# Patient Record
Sex: Female | Born: 1970 | Race: White | Hispanic: No | Marital: Married | Smoking: Former smoker
Health system: Southern US, Community
[De-identification: ages and names within clinical notes are randomized; demographics above are authoritative.]

## PROBLEM LIST (undated history)

## (undated) DIAGNOSIS — F17200 Nicotine dependence, unspecified, uncomplicated: Secondary | ICD-10-CM

## (undated) DIAGNOSIS — I471 Supraventricular tachycardia, unspecified: Secondary | ICD-10-CM

## (undated) DIAGNOSIS — G5600 Carpal tunnel syndrome, unspecified upper limb: Secondary | ICD-10-CM

## (undated) DIAGNOSIS — F1190 Opioid use, unspecified, uncomplicated: Secondary | ICD-10-CM

## (undated) DIAGNOSIS — F419 Anxiety disorder, unspecified: Secondary | ICD-10-CM

## (undated) DIAGNOSIS — K56609 Unspecified intestinal obstruction, unspecified as to partial versus complete obstruction: Secondary | ICD-10-CM

## (undated) DIAGNOSIS — G08 Intracranial and intraspinal phlebitis and thrombophlebitis: Secondary | ICD-10-CM

## (undated) DIAGNOSIS — Z79899 Other long term (current) drug therapy: Secondary | ICD-10-CM

## (undated) DIAGNOSIS — K625 Hemorrhage of anus and rectum: Secondary | ICD-10-CM

## (undated) DIAGNOSIS — M1990 Unspecified osteoarthritis, unspecified site: Secondary | ICD-10-CM

## (undated) DIAGNOSIS — E119 Type 2 diabetes mellitus without complications: Secondary | ICD-10-CM

## (undated) DIAGNOSIS — I10 Essential (primary) hypertension: Secondary | ICD-10-CM

## (undated) DIAGNOSIS — D649 Anemia, unspecified: Secondary | ICD-10-CM

## (undated) DIAGNOSIS — M797 Fibromyalgia: Secondary | ICD-10-CM

## (undated) DIAGNOSIS — F32A Depression, unspecified: Secondary | ICD-10-CM

## (undated) DIAGNOSIS — K219 Gastro-esophageal reflux disease without esophagitis: Secondary | ICD-10-CM

## (undated) DIAGNOSIS — I5189 Other ill-defined heart diseases: Secondary | ICD-10-CM

## (undated) DIAGNOSIS — Z9889 Other specified postprocedural states: Secondary | ICD-10-CM

## (undated) DIAGNOSIS — G4733 Obstructive sleep apnea (adult) (pediatric): Secondary | ICD-10-CM

## (undated) DIAGNOSIS — D352 Benign neoplasm of pituitary gland: Secondary | ICD-10-CM

## (undated) DIAGNOSIS — Z87898 Personal history of other specified conditions: Secondary | ICD-10-CM

## (undated) DIAGNOSIS — D497 Neoplasm of unspecified behavior of endocrine glands and other parts of nervous system: Secondary | ICD-10-CM

## (undated) DIAGNOSIS — H93A9 Pulsatile tinnitus, unspecified ear: Secondary | ICD-10-CM

## (undated) DIAGNOSIS — D509 Iron deficiency anemia, unspecified: Secondary | ICD-10-CM

## (undated) HISTORY — DX: Intracranial and intraspinal phlebitis and thrombophlebitis: G08

## (undated) HISTORY — DX: Carpal tunnel syndrome, unspecified upper limb: G56.00

## (undated) HISTORY — DX: Personal history of other specified conditions: Z87.898

## (undated) HISTORY — DX: Depression, unspecified: F32.A

## (undated) HISTORY — DX: Nicotine dependence, unspecified, uncomplicated: F17.200

## (undated) HISTORY — PX: FRACTURE SURGERY: SHX138

## (undated) HISTORY — DX: Opioid use, unspecified, uncomplicated: F11.90

## (undated) HISTORY — DX: Anemia, unspecified: D64.9

## (undated) HISTORY — PX: CHOLECYSTECTOMY: SHX55

## (undated) HISTORY — PX: CARDIAC ELECTROPHYSIOLOGY STUDY AND ABLATION: SHX1294

## (undated) HISTORY — DX: Obstructive sleep apnea (adult) (pediatric): G47.33

## (undated) HISTORY — PX: BACK SURGERY: SHX140

---

## 2014-05-26 DIAGNOSIS — D352 Benign neoplasm of pituitary gland: Secondary | ICD-10-CM

## 2014-05-26 HISTORY — DX: Benign neoplasm of pituitary gland: D35.2

## 2019-03-07 ENCOUNTER — Encounter: Payer: Self-pay | Admitting: Emergency Medicine

## 2019-03-07 ENCOUNTER — Other Ambulatory Visit: Payer: Self-pay

## 2019-03-07 ENCOUNTER — Ambulatory Visit
Admission: EM | Admit: 2019-03-07 | Discharge: 2019-03-07 | Disposition: A | Discharge status: Home / Self Care | Payer: BC Managed Care – PPO | Attending: Family Medicine | Admitting: Family Medicine

## 2019-03-07 DIAGNOSIS — R319 Hematuria, unspecified: Secondary | ICD-10-CM

## 2019-03-07 DIAGNOSIS — N390 Urinary tract infection, site not specified: Secondary | ICD-10-CM | POA: Diagnosis not present

## 2019-03-07 HISTORY — DX: Unspecified osteoarthritis, unspecified site: M19.90

## 2019-03-07 HISTORY — DX: Type 2 diabetes mellitus without complications: E11.9

## 2019-03-07 LAB — URINALYSIS, COMPLETE (UACMP) WITH MICROSCOPIC
Bilirubin Urine: NEGATIVE
Glucose, UA: NEGATIVE mg/dL
Ketones, ur: NEGATIVE mg/dL
Nitrite: NEGATIVE
Protein, ur: NEGATIVE mg/dL
Specific Gravity, Urine: 1.01 (ref 1.005–1.030)
pH: 7 (ref 5.0–8.0)

## 2019-03-07 MED ORDER — CEPHALEXIN 500 MG PO CAPS: 500.0000 mg | ORAL_CAPSULE | Freq: Two times a day (BID) | ORAL | 0 refills | Status: AC

## 2019-03-07 NOTE — ED Triage Notes (Signed)
Patient in office today for UTI? States this started around 5 a.m with Freq,Urgency and Burning   SYV:GCYO

## 2019-03-07 NOTE — ED Provider Notes (Signed)
[facility] ____________________________________________  Time seen: Approximately 2:05 PM  I have reviewed the triage vital signs and the nursing notes.   HISTORY  Chief Complaint Urinary Tract Infection   HPI Breanna Ramos is a 48 y.o. female presenting for evaluation of urinary frequency, urinary urgency and burning with urination since around 5 AM this morning.  Patient states feels consistent with previous UTIs prompting her to come in.  Denies any atypical back pain.  Some lower abdominal midline pressure.  Denies atypical vaginal discharge.  Denies recent fevers, vomiting, cough, or recent sickness.  Denies aggravating factors.  Over-the-counter Azo has helped minimally.  Reports otherwise doing well.    Past Medical History:  Diagnosis Date  . Arthritis   . Diabetes mellitus without complication ([place])     There are no active problems to display for this patient.   surgical history Back surgery   No current facility-administered medications for this encounter.   Current Outpatient Medications:  .  celecoxib (CELEBREX) 200 MG capsule, Take by mouth., Disp: , Rfl:  .  cyclobenzaprine (FLEXERIL) 10 MG tablet, Take by mouth., Disp: , Rfl:  .  diltiazem (CARDIZEM CD) 240 MG 24 hr capsule, Take by mouth., Disp: , Rfl:  .  famciclovir (FAMVIR) 500 MG tablet, Take by mouth., Disp: , Rfl:  .  FLUoxetine (PROZAC) 40 MG capsule, Take 40 mg by mouth daily., Disp: , Rfl:  .  metFORMIN (GLUCOPHAGE-XR) 500 MG 24 hr tablet, Take by mouth., Disp: , Rfl:  .  omeprazole (PRILOSEC) 40 MG capsule, Take by mouth., Disp: , Rfl:  .  ondansetron (ZOFRAN) 4 MG tablet, Take 4 mg by mouth every 8 (eight) hours as needed for nausea or vomiting., Disp: , Rfl:  .  oxyCODONE-acetaminophen (PERCOCET/ROXICET) 5-325 MG tablet, Take by mouth., Disp: , Rfl:  .  topiramate (TOPAMAX) 50 MG tablet, Take by mouth., Disp: , Rfl:  .  cephALEXin (KEFLEX) 500 MG capsule, Take 1 capsule (500 mg  total) by mouth 2 (two) times daily for 7 days., Disp: 14 capsule, Rfl: 0 .  LORazepam (ATIVAN) 1 MG tablet, Take by mouth., Disp: , Rfl:   Allergies Codeine  History reviewed. No pertinent family history.  Social History Social History   Tobacco Use  . Smoking status: Current Some Day Smoker  . Smokeless tobacco: Never Used  Substance Use Topics  . Alcohol use: Yes  . Drug use: Never    Review of Systems Constitutional: No fever Cardiovascular: Denies chest pain. Respiratory: Denies shortness of breath. Gastrointestinal: No abdominal pain.  No nausea, no vomiting.   Genitourinary: Positive for dysuria. Musculoskeletal: Negative for atypical back pain. Skin: Negative for rash.   ____________________________________________   PHYSICAL EXAM:  VITAL SIGNS: ED Triage Vitals  Enc Vitals Group     BP 03/07/19 1316 (!) 125/93     Pulse Rate 03/07/19 1316 72     Resp 03/07/19 1316 18     Temp 03/07/19 1316 98.8 F (37.1 C)     Temp Source 03/07/19 1316 Oral     SpO2 03/07/19 1316 99 %     Weight 03/07/19 1306 204 lb (92.5 kg)     Height --      Head Circumference --      Peak Flow --      Pain Score 03/07/19 1306 4     Pain Loc --      Pain Edu? --      Excl. in [place]? --  Constitutional: Alert and oriented. Well appearing and in no acute distress. Eyes: Conjunctivae are normal.  ENT      Head: Normocephalic and atraumatic. Cardiovascular: Normal rate, regular rhythm. Grossly normal heart sounds.  Good peripheral circulation. Respiratory: Normal respiratory effort without tachypnea nor retractions. Breath sounds are clear and equal bilaterally. No wheezes, rales, rhonchi. Gastrointestinal: Soft and nontender. No CVA tenderness. Musculoskeletal: Steady gait.  Neurologic:  Normal speech and language.Speech is normal. No gait instability.  Skin:  Skin is warm, dry and intact. No rash noted. Psychiatric: Mood and affect are normal. Speech and behavior are normal.  Patient exhibits appropriate insight and judgment   ___________________________________________   LABS (all labs ordered are listed, but only abnormal results are displayed)  Labs Reviewed  URINALYSIS, COMPLETE (UACMP) WITH MICROSCOPIC - Abnormal; Notable for the following components:      Result Value   Hgb urine dipstick TRACE (*)    Leukocytes,Ua SMALL (*)    Bacteria, UA FEW (*)    All other components within normal limits  URINE CULTURE     PROCEDURES Procedures    INITIAL IMPRESSION / ASSESSMENT AND PLAN / ED COURSE  Pertinent labs & imaging results that were available during my care of the patient were reviewed by me and considered in my medical decision making (see chart for details).  Well-appearing patient.  No acute distress.  Urinalysis reviewed, suspect UTI.  Will empirically start oral Keflex.  Encourage rest, fluids, supportive care.Discussed indication, risks and benefits of medications with patient.  Discussed follow up and return parameters including no resolution or any worsening concerns. Patient verbalized understanding and agreed to plan.   ____________________________________________   FINAL CLINICAL IMPRESSION(S) / ED DIAGNOSES  Final diagnoses:  Urinary tract infection with hematuria, site unspecified     ED Discharge Orders         Ordered    cephALEXin (KEFLEX) 500 MG capsule  2 times daily     03/07/19 1402           Note: This dictation was prepared with Dragon dictation along with smaller phrase technology. Any transcriptional errors that result from this process are unintentional.         Marylene Land, NP 03/07/19 1442

## 2019-03-07 NOTE — Discharge Instructions (Addendum)
Take medication as prescribed. Drink plenty of fluids.   Follow up with your primary care physician this week as needed. Return to Urgent care for new or worsening concerns.

## 2019-03-09 LAB — URINE CULTURE: Culture: <10000 — AB

## 2019-12-12 ENCOUNTER — Encounter: Payer: Self-pay | Admitting: Emergency Medicine

## 2019-12-12 ENCOUNTER — Other Ambulatory Visit: Payer: Self-pay

## 2019-12-12 ENCOUNTER — Ambulatory Visit
Admission: EM | Admit: 2019-12-12 | Discharge: 2019-12-12 | Disposition: A | Discharge status: Home / Self Care | Payer: BLUE CROSS/BLUE SHIELD | Attending: Family Medicine | Admitting: Family Medicine

## 2019-12-12 DIAGNOSIS — J069 Acute upper respiratory infection, unspecified: Secondary | ICD-10-CM | POA: Diagnosis not present

## 2019-12-12 DIAGNOSIS — Z79899 Other long term (current) drug therapy: Secondary | ICD-10-CM | POA: Diagnosis not present

## 2019-12-12 DIAGNOSIS — M1990 Unspecified osteoarthritis, unspecified site: Secondary | ICD-10-CM | POA: Insufficient documentation

## 2019-12-12 DIAGNOSIS — Z20822 Contact with and (suspected) exposure to covid-19: Secondary | ICD-10-CM | POA: Insufficient documentation

## 2019-12-12 DIAGNOSIS — E119 Type 2 diabetes mellitus without complications: Secondary | ICD-10-CM | POA: Insufficient documentation

## 2019-12-12 DIAGNOSIS — F17200 Nicotine dependence, unspecified, uncomplicated: Secondary | ICD-10-CM | POA: Insufficient documentation

## 2019-12-12 DIAGNOSIS — R05 Cough: Secondary | ICD-10-CM | POA: Diagnosis present

## 2019-12-12 DIAGNOSIS — Z7984 Long term (current) use of oral hypoglycemic drugs: Secondary | ICD-10-CM | POA: Diagnosis not present

## 2019-12-12 LAB — SARS CORONAVIRUS 2 (TAT 6-24 HRS): SARS Coronavirus 2: NEGATIVE

## 2019-12-12 MED ORDER — IPRATROPIUM BROMIDE 0.06 % NA SOLN: 2.0000 | Freq: Four times a day (QID) | NASAL | 0 refills | Status: DC | PRN

## 2019-12-12 MED ORDER — BENZONATATE 200 MG PO CAPS: 200.0000 mg | ORAL_CAPSULE | Freq: Three times a day (TID) | ORAL | 0 refills | Status: DC | PRN

## 2019-12-12 MED ORDER — CETIRIZINE-PSEUDOEPHEDRINE ER 5-120 MG PO TB12: 1.0000 | ORAL_TABLET | Freq: Two times a day (BID) | ORAL | 0 refills | Status: DC

## 2019-12-12 NOTE — ED Provider Notes (Signed)
[facility]    CSN: [id number] Arrival date & time: 12/12/19  0940      History   Chief Complaint Chief Complaint  Patient presents with   Cough   Nasal Congestion   Otalgia    HPI  49 year old female presents with respiratory symptoms.  Started 2 days ago.  Patient reports cough, congestion, rhinorrhea, body aches.  Also reports elevated temperature, T-max 99.5.  Patient states that she has a "low-grade fever".  No reported sick contacts.  She has been vaccinated.  She has taken some over-the-counter medication without relief.  No known exacerbating factors.  No other associated symptoms.  No other complaints.  Past Medical History:  Diagnosis Date   Arthritis    Diabetes mellitus without complication ([place])     Home Medications    Prior to Admission medications   Medication Sig Start Date End Date Taking? Authorizing Provider  celecoxib (CELEBREX) 200 MG capsule Take by mouth.   Yes [provider]  cyclobenzaprine (FLEXERIL) 10 MG tablet Take by mouth. 03/01/16  Yes [provider]  diltiazem (CARDIZEM CD) 240 MG 24 hr capsule Take by mouth. 03/01/16  Yes [provider]  famciclovir (FAMVIR) 500 MG tablet Take by mouth.   Yes [provider]  FLUoxetine (PROZAC) 40 MG capsule Take 40 mg by mouth daily.   Yes [provider]  LORazepam (ATIVAN) 1 MG tablet Take by mouth.   Yes [provider]  metFORMIN (GLUCOPHAGE-XR) 500 MG 24 hr tablet Take by mouth. 02/23/16  Yes [provider]  omeprazole (PRILOSEC) 40 MG capsule Take by mouth. 02/21/16  Yes [provider]  ondansetron (ZOFRAN) 4 MG tablet Take 4 mg by mouth every 8 (eight) hours as needed for nausea or vomiting.   Yes [provider]  oxyCODONE-acetaminophen (PERCOCET/ROXICET) 5-325 MG tablet Take by mouth. 03/02/16  Yes [provider]  topiramate (TOPAMAX) 50 MG tablet Take by mouth.   Yes [provider]  benzonatate (TESSALON) 200 MG capsule Take 1 capsule (200 mg total) by mouth 3 (three) times daily as needed for cough. 12/12/19   Coral Spikes, DO  cetirizine-pseudoephedrine (ZYRTEC-D) 5-120 MG tablet Take 1 tablet by mouth 2 (two) times daily. 12/12/19   Thersa Salt G, DO  ipratropium (ATROVENT) 0.06 % nasal spray Place 2 sprays into both nostrils 4 (four) times daily as needed for rhinitis. 12/12/19   Coral Spikes, DO    Family History History reviewed. No pertinent family history.  Social History Social History   Tobacco Use   Smoking status: Current Some Day Smoker   Smokeless tobacco: Never Used  Substance Use Topics   Alcohol use: Yes   Drug use: Never     Allergies   Codeine   Review of Systems Review of Systems Per HPI  Physical Exam Triage Vital Signs ED Triage Vitals  Enc Vitals Group     BP 12/12/19 1001 135/88     Pulse Rate 12/12/19 1001 (!) 102     Resp 12/12/19 1001 18     Temp 12/12/19 1001 98.8 F (37.1 C)     Temp Source 12/12/19 1001 Oral     SpO2 12/12/19 1001 99 %     Weight 12/12/19 0955 188 lb (85.3 kg)     Height 12/12/19 0955 5' 6"  (1.676 m)     Head Circumference --      Peak Flow --      Pain Score 12/12/19  0955 0     Pain Loc --      Pain Edu? --      Excl. in [place]? --    Updated Vital Signs BP 135/88 (BP Location: Right Arm)    Pulse (!) 102    Temp 98.8 F (37.1 C) (Oral)    Resp 18    Ht 5' 6"  (1.676 m)    Wt 85.3 kg    LMP 12/09/2019    SpO2 99%    BMI 30.34 kg/m   Visual Acuity Right Eye Distance:   Left Eye Distance:   Bilateral Distance:    Right Eye Near:   Left Eye Near:    Bilateral Near:     Physical Exam Vitals and nursing note reviewed.  Constitutional:      General: She is not in acute distress.    Appearance: Normal appearance. She is not ill-appearing.  HENT:     Head: Normocephalic and atraumatic.     Right Ear: Tympanic membrane normal.     Left Ear: Tympanic membrane normal.     Mouth/Throat:      Pharynx: Oropharynx is clear. No oropharyngeal exudate.  Eyes:     General:        Right eye: No discharge.        Left eye: No discharge.     Conjunctiva/sclera: Conjunctivae normal.  Cardiovascular:     Rate and Rhythm: Normal rate and regular rhythm.     Heart sounds: No murmur heard.   Pulmonary:     Effort: Pulmonary effort is normal.     Breath sounds: Normal breath sounds. No wheezing, rhonchi or rales.  Neurological:     Mental Status: She is alert.  Psychiatric:        Mood and Affect: Mood normal.        Behavior: Behavior normal.    UC Treatments / Results  Labs (all labs ordered are listed, but only abnormal results are displayed) Labs Reviewed  SARS CORONAVIRUS 2 (TAT 6-24 HRS)    EKG   Radiology No results found.  Procedures Procedures (including critical care time)  Medications Ordered in UC Medications - No data to display  Initial Impression / Assessment and Plan / UC Course  I have reviewed the triage vital signs and the nursing notes.  Pertinent labs & imaging results that were available during my care of the patient were reviewed by me and considered in my medical decision making (see chart for details).    49 year old female presents with a viral URI with cough.  Awaiting Covid test result.  Treating with Atrovent nasal spray, Zyrtec-D, Tessalon Perles.  Final Clinical Impressions(s) / UC Diagnoses   Final diagnoses:  Viral URI with cough     Discharge Instructions     Rest.  Fluids.  Medications as directed.  Awaiting COVID test result (should be back tomorrow; check your mychart).  Take care  Dr. Lacinda Axon    ED Prescriptions    Medication Sig Dispense Auth. Provider   ipratropium (ATROVENT) 0.06 % nasal spray Place 2 sprays into both nostrils 4 (four) times daily as needed for rhinitis. 15 mL Adyn Hoes G, DO   cetirizine-pseudoephedrine (ZYRTEC-D) 5-120 MG tablet Take 1 tablet by mouth 2 (two) times daily. 30 tablet  Dilpreet Faires G, DO   benzonatate (TESSALON) 200 MG capsule Take 1 capsule (200 mg total) by mouth 3 (three) times daily as needed for cough. 30 capsule [place], [place], [place]  PDMP not reviewed this encounter.   Coral Spikes, DO 12/12/19 1100

## 2019-12-12 NOTE — ED Triage Notes (Signed)
Patient c/o cough, congestion and ear pain that started 2 days ago.

## 2019-12-12 NOTE — Discharge Instructions (Signed)
Rest.  Fluids.  Medications as directed.  Awaiting COVID test result (should be back tomorrow; check your mychart).  Take care  Dr. Lacinda Axon

## 2020-02-07 ENCOUNTER — Other Ambulatory Visit: Payer: Self-pay | Admitting: Internal Medicine

## 2020-02-07 DIAGNOSIS — Z1231 Encounter for screening mammogram for malignant neoplasm of breast: Secondary | ICD-10-CM

## 2020-02-13 ENCOUNTER — Inpatient Hospital Stay
Admission: EM | Admit: 2020-02-13 | Discharge: 2020-02-15 | DRG: 389 | Disposition: A | Discharge status: Home / Self Care | Payer: BLUE CROSS/BLUE SHIELD | Attending: Internal Medicine | Admitting: Internal Medicine

## 2020-02-13 ENCOUNTER — Other Ambulatory Visit: Payer: Self-pay

## 2020-02-13 ENCOUNTER — Emergency Department: Payer: BLUE CROSS/BLUE SHIELD

## 2020-02-13 DIAGNOSIS — I471 Supraventricular tachycardia: Secondary | ICD-10-CM | POA: Diagnosis present

## 2020-02-13 DIAGNOSIS — Z7984 Long term (current) use of oral hypoglycemic drugs: Secondary | ICD-10-CM

## 2020-02-13 DIAGNOSIS — K56609 Unspecified intestinal obstruction, unspecified as to partial versus complete obstruction: Secondary | ICD-10-CM

## 2020-02-13 DIAGNOSIS — M1990 Unspecified osteoarthritis, unspecified site: Secondary | ICD-10-CM | POA: Diagnosis present

## 2020-02-13 DIAGNOSIS — K56600 Partial intestinal obstruction, unspecified as to cause: Principal | ICD-10-CM | POA: Diagnosis present

## 2020-02-13 DIAGNOSIS — Z8249 Family history of ischemic heart disease and other diseases of the circulatory system: Secondary | ICD-10-CM

## 2020-02-13 DIAGNOSIS — N200 Calculus of kidney: Secondary | ICD-10-CM | POA: Diagnosis present

## 2020-02-13 DIAGNOSIS — I10 Essential (primary) hypertension: Secondary | ICD-10-CM | POA: Diagnosis present

## 2020-02-13 DIAGNOSIS — Z803 Family history of malignant neoplasm of breast: Secondary | ICD-10-CM

## 2020-02-13 DIAGNOSIS — M797 Fibromyalgia: Secondary | ICD-10-CM | POA: Diagnosis present

## 2020-02-13 DIAGNOSIS — Z981 Arthrodesis status: Secondary | ICD-10-CM

## 2020-02-13 DIAGNOSIS — Z79899 Other long term (current) drug therapy: Secondary | ICD-10-CM

## 2020-02-13 DIAGNOSIS — Z79890 Hormone replacement therapy (postmenopausal): Secondary | ICD-10-CM

## 2020-02-13 DIAGNOSIS — Z6830 Body mass index (BMI) 30.0-30.9, adult: Secondary | ICD-10-CM

## 2020-02-13 DIAGNOSIS — F17210 Nicotine dependence, cigarettes, uncomplicated: Secondary | ICD-10-CM | POA: Diagnosis present

## 2020-02-13 DIAGNOSIS — Z20822 Contact with and (suspected) exposure to covid-19: Secondary | ICD-10-CM | POA: Diagnosis present

## 2020-02-13 DIAGNOSIS — F419 Anxiety disorder, unspecified: Secondary | ICD-10-CM | POA: Diagnosis present

## 2020-02-13 DIAGNOSIS — F329 Major depressive disorder, single episode, unspecified: Secondary | ICD-10-CM | POA: Diagnosis present

## 2020-02-13 DIAGNOSIS — Z79891 Long term (current) use of opiate analgesic: Secondary | ICD-10-CM

## 2020-02-13 DIAGNOSIS — G8929 Other chronic pain: Secondary | ICD-10-CM | POA: Diagnosis present

## 2020-02-13 DIAGNOSIS — E669 Obesity, unspecified: Secondary | ICD-10-CM | POA: Diagnosis present

## 2020-02-13 DIAGNOSIS — Z9049 Acquired absence of other specified parts of digestive tract: Secondary | ICD-10-CM

## 2020-02-13 DIAGNOSIS — E119 Type 2 diabetes mellitus without complications: Secondary | ICD-10-CM | POA: Diagnosis present

## 2020-02-13 DIAGNOSIS — D352 Benign neoplasm of pituitary gland: Secondary | ICD-10-CM | POA: Diagnosis present

## 2020-02-13 DIAGNOSIS — M549 Dorsalgia, unspecified: Secondary | ICD-10-CM | POA: Diagnosis present

## 2020-02-13 DIAGNOSIS — Z823 Family history of stroke: Secondary | ICD-10-CM

## 2020-02-13 DIAGNOSIS — Z833 Family history of diabetes mellitus: Secondary | ICD-10-CM

## 2020-02-13 DIAGNOSIS — Z885 Allergy status to narcotic agent status: Secondary | ICD-10-CM

## 2020-02-13 HISTORY — DX: Fibromyalgia: M79.7

## 2020-02-13 HISTORY — DX: Other specified postprocedural states: Z98.890

## 2020-02-13 HISTORY — DX: Essential (primary) hypertension: I10

## 2020-02-13 HISTORY — DX: Benign neoplasm of pituitary gland: D35.2

## 2020-02-13 LAB — TROPONIN I (HIGH SENSITIVITY)
Troponin I (High Sensitivity): 3 ng/L (ref <18)
Troponin I (High Sensitivity): 3 ng/L (ref <18)

## 2020-02-13 LAB — COMPREHENSIVE METABOLIC PANEL
ALT: 25 U/L (ref 0–44)
AST: 20 U/L (ref 15–41)
Albumin: 4 g/dL (ref 3.5–5.0)
Alkaline Phosphatase: 80 U/L (ref 38–126)
Anion gap: 9 (ref 5–15)
BUN: 15 mg/dL (ref 6–20)
CO2: 28 mmol/L (ref 22–32)
Calcium: 8.9 mg/dL (ref 8.9–10.3)
Chloride: 101 mmol/L (ref 98–111)
Creatinine, Ser: 0.59 mg/dL (ref 0.44–1.00)
GFR calc Af Amer: >60 mL/min (ref >60)
GFR calc non Af Amer: >60 mL/min (ref >60)
Glucose, Bld: 95 mg/dL (ref 70–99)
Potassium: 4.1 mmol/L (ref 3.5–5.1)
Sodium: 138 mmol/L (ref 135–145)
Total Bilirubin: 0.6 mg/dL (ref 0.3–1.2)
Total Protein: 7 g/dL (ref 6.5–8.1)

## 2020-02-13 LAB — CBC
HCT: 35.4 % — ABNORMAL LOW (ref 36.0–46.0)
Hemoglobin: 11.8 g/dL — ABNORMAL LOW (ref 12.0–15.0)
MCH: 30.3 pg (ref 26.0–34.0)
MCHC: 33.3 g/dL (ref 30.0–36.0)
MCV: 91 fL (ref 80.0–100.0)
Platelets: 360 10*3/uL (ref 150–400)
RBC: 3.89 MIL/uL (ref 3.87–5.11)
RDW: 13.4 % (ref 11.5–15.5)
WBC: 10.4 10*3/uL (ref 4.0–10.5)
nRBC: 0 % (ref 0.0–0.2)

## 2020-02-13 LAB — URINALYSIS, COMPLETE (UACMP) WITH MICROSCOPIC
Bacteria, UA: NONE SEEN
Bilirubin Urine: NEGATIVE
Glucose, UA: NEGATIVE mg/dL
Hgb urine dipstick: NEGATIVE
Ketones, ur: NEGATIVE mg/dL
Leukocytes,Ua: NEGATIVE
Nitrite: NEGATIVE
Protein, ur: NEGATIVE mg/dL
Specific Gravity, Urine: 1.013 (ref 1.005–1.030)
pH: 7 (ref 5.0–8.0)

## 2020-02-13 LAB — SARS CORONAVIRUS 2 BY RT PCR (HOSPITAL ORDER, PERFORMED IN ~~LOC~~ HOSPITAL LAB): SARS Coronavirus 2: NEGATIVE

## 2020-02-13 LAB — LIPASE, BLOOD: Lipase: 23 U/L (ref 11–51)

## 2020-02-13 LAB — POCT PREGNANCY, URINE: Preg Test, Ur: NEGATIVE

## 2020-02-13 MED ORDER — ONDANSETRON HCL 4 MG/2ML IJ SOLN
4.0000 mg | Freq: Once | INTRAMUSCULAR | Status: AC
  Administered 2020-02-13: 4 mg via INTRAVENOUS
  Filled 2020-02-13: qty 2

## 2020-02-13 MED ORDER — LACTATED RINGERS IV SOLN: INTRAVENOUS | Status: AC

## 2020-02-13 MED ORDER — IOHEXOL 300 MG/ML  SOLN
100.0000 mL | Freq: Once | INTRAMUSCULAR | Status: AC | PRN
  Administered 2020-02-13: 100 mL via INTRAVENOUS
  Filled 2020-02-13: qty 100

## 2020-02-13 MED ORDER — ONDANSETRON HCL 4 MG/2ML IJ SOLN: 4.0000 mg | Freq: Four times a day (QID) | INTRAMUSCULAR | Status: DC | PRN

## 2020-02-13 MED ORDER — IOHEXOL 9 MG/ML PO SOLN
500.0000 mL | Freq: Once | ORAL | Status: AC
  Administered 2020-02-13: 1000 mL via ORAL
  Filled 2020-02-13: qty 500

## 2020-02-13 MED ORDER — FENTANYL CITRATE (PF) 100 MCG/2ML IJ SOLN
100.0000 ug | Freq: Once | INTRAMUSCULAR | Status: AC
  Administered 2020-02-13: 100 ug via INTRAVENOUS
  Filled 2020-02-13: qty 2

## 2020-02-13 MED ORDER — NICOTINE 7 MG/24HR TD PT24
7.0000 mg | MEDICATED_PATCH | Freq: Every day | TRANSDERMAL | Status: DC
  Administered 2020-02-14 – 2020-02-15 (×2): 7 mg via TRANSDERMAL
  Filled 2020-02-13 (×5): qty 1

## 2020-02-13 MED ORDER — LORAZEPAM 2 MG/ML IJ SOLN
1.0000 mg | Freq: Once | INTRAMUSCULAR | Status: AC
  Administered 2020-02-13: 1 mg via INTRAVENOUS
  Filled 2020-02-13: qty 1

## 2020-02-13 MED ORDER — ONDANSETRON HCL 4 MG PO TABS
4.0000 mg | ORAL_TABLET | Freq: Four times a day (QID) | ORAL | Status: DC | PRN
  Administered 2020-02-13: 4 mg via ORAL
  Filled 2020-02-13: qty 1

## 2020-02-13 MED ORDER — MORPHINE SULFATE (PF) 2 MG/ML IV SOLN
2.0000 mg | INTRAVENOUS | Status: DC | PRN
  Administered 2020-02-13 – 2020-02-14 (×3): 2 mg via INTRAVENOUS
  Filled 2020-02-13 (×3): qty 1

## 2020-02-13 NOTE — ED Triage Notes (Addendum)
Pt to Ed with 1 quick episode of CP 3 days ago and another quick episode this morning that waS very sharp. Pt states her stomach now hurts very badly and feels exactly like last time she had pancreatitis. PT has also had nausea. Pain does not get worse with eating and drinking.

## 2020-02-13 NOTE — Progress Notes (Signed)
Briefly, this is a 50 y/o F who presented to the ED with fairly diffuse abdominal pain, worse in the epigastrium, and nausea.  Chemistry panel and CBC without significant abnormalities.  Prior history of cholecystectomy, lumbar spinal fusion (unsure if anterior approach), prior episodes of pancreatitis.  CT scan shows gas and stool throughout the colon, as well as dilated small bowel and distended stomach.  Transition zone seen in anterior mid left abdomen.  Recommend admission to medicine, NPO, NGT, IVF.  Full consult to follow in the AM.

## 2020-02-13 NOTE — ED Notes (Signed)
Lorriane Shire RN attempted to insert NG tube x2 with no success. [place] NP notified, will come place NG tube when patient in 201. [place] floor RN notified, will take patient to floor at this time.

## 2020-02-13 NOTE — ED Provider Notes (Signed)
[facility] Emergency Department Provider Note  Time seen: 7:21 PM  I have reviewed the triage vital signs and the nursing notes.   HISTORY  Chief Complaint Abdominal Pain   HPI Breanna Ramos is a 49 y.o. female with a past medical history of for the past 3 days or so she has been experiencing upper abdominal pain that radiates to her back which she states feels identical to her episode of pancreatitis she suffered 6 years ago.  Patient states 6 years ago she had pancreatitis caused by a gallstone.  Patient has since had her gallbladder out.  Patient denies any significant alcohol use.  Denies any recent aspirin or NSAID use.  Patient describes her pain as moderate mostly in the epigastrium but also fairly diffusely throughout her abdomen.   Past Medical History:  Diagnosis Date  . Arthritis   . Diabetes mellitus without complication ([place])   . Fibromyalgia   . H/O cardiac radiofrequency ablation   . 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   No past surgical history on file.  Prior to Admission medications   Medication Sig Start Date End Date Taking? Authorizing Provider  ascorbic acid (VITAMIN C) 500 MG tablet Take by mouth.    [provider]  benzonatate (TESSALON) 200 MG capsule Take 1 capsule (200 mg total) by mouth 3 (three) times daily as needed for cough. 12/12/19   Coral Spikes, DO  bisoprolol (ZEBETA) 5 MG tablet Take by mouth. 12/19/19   [provider]  buPROPion (WELLBUTRIN XL) 300 MG 24 hr tablet Take by mouth. 02/07/20 02/06/21  [provider]  cabergoline (DOSTINEX) 0.5 MG tablet Take 0.5 mg by mouth 2 (two) times a week. 09/18/19   [provider]  celecoxib (CELEBREX) 200 MG capsule Take by mouth.    [provider]  Cholecalciferol 50 MCG (2000 UT) CAPS Take by mouth.    [provider]  cyanocobalamin 1000 MCG tablet Take by mouth.    [provider]  cyclobenzaprine (FLEXERIL) 10 MG tablet Take by mouth. 03/01/16   [provider]  diltiazem (TIAZAC) 240 MG 24 hr capsule Take by mouth.    [provider]  famciclovir (FAMVIR) 500 MG tablet Take by mouth.    [provider]  FLUoxetine (PROZAC) 40 MG capsule Take 40 mg by mouth daily.    [provider]  gabapentin (NEURONTIN) 300 MG capsule Take 300 mg by mouth 2 (two) times daily. 09/26/19   [provider]  ipratropium (ATROVENT) 0.06 % nasal spray Place 2 sprays into both nostrils 4 (four) times daily as needed for rhinitis. 12/12/19   Coral Spikes, DO  loratadine (CLARITIN) 10 MG tablet Take by mouth.    [provider]  LORazepam (ATIVAN) 1 MG tablet Take by mouth.    [provider]  Magnesium Oxide 500 MG TABS Take 1 tablet by mouth daily.    [provider]  Melatonin 5 MG CAPS Take by mouth.    [provider]  metFORMIN (GLUCOPHAGE-XR) 500 MG 24 hr tablet Take by mouth. 02/23/16   [provider]  Multiple Vitamin (MULTI-VITAMIN) tablet Take 1 tablet by mouth daily.    [provider]  omeprazole (PRILOSEC) 40 MG capsule Take by mouth. 02/21/16   [provider]  ondansetron (ZOFRAN) 4 MG tablet Take 4 mg by mouth every 8 (eight) hours as needed for nausea  or vomiting.    [provider]  oxyCODONE (OXYCONTIN) 10 mg 12 hr tablet Take by mouth. 03/02/16   [provider]  oxyCODONE-acetaminophen (PERCOCET/ROXICET) 5-325 MG tablet Take by mouth. 03/02/16   [provider]  topiramate (TOPAMAX) 50 MG tablet Take by mouth.    [provider]    Allergies  Allergen Reactions  . Codeine     No family history on file.  Social History Social History   Tobacco Use  . Smoking status: Current Every Day Smoker    Packs/day: 0.25  . Smokeless tobacco: Never Used  Substance Use Topics  . Alcohol use: Yes  . Drug use: Never     Review of Systems Constitutional: Negative for fever. Cardiovascular: Negative for chest pain. Respiratory: Negative for shortness of breath. Gastrointestinal: Upper sharp abdominal pain.  Negative for vomiting or diarrhea.  Does have some nausea. Genitourinary: Negative for urinary compaints Musculoskeletal: Negative for musculoskeletal complaints Neurological: Negative for headache All other ROS negative  ____________________________________________   PHYSICAL EXAM:  VITAL SIGNS: ED Triage Vitals  Enc Vitals Group     BP 02/13/20 1611 (!) 151/95     Pulse Rate 02/13/20 1611 71     Resp 02/13/20 1611 18     Temp 02/13/20 1611 98.3 F (36.8 C)     Temp Source 02/13/20 1611 Oral     SpO2 02/13/20 1611 100 %     Weight 02/13/20 1612 184 lb (83.5 kg)     Height 02/13/20 1612 5' 6"  (1.676 m)     Head Circumference --      Peak Flow --      Pain Score 02/13/20 1612 6     Pain Loc --      Pain Edu? --      Excl. in [place]? --     Constitutional: Alert and oriented. Well appearing and in no distress. Eyes: Normal exam ENT      Head: Normocephalic and atraumatic.      Mouth/Throat: Mucous membranes are moist. Cardiovascular: Normal rate, regular rhythm. No murmur Respiratory: Normal respiratory effort without tachypnea nor retractions. Breath sounds are clear Gastrointestinal: Soft, moderate epeigastric ttp, otherwise mild diffuse ttp, without rebound guarding or distention. Musculoskelet Nontender with normal range of motion in all extremities.  Neurologic:  Normal speech and language. No gross focal neurologic deficits  Skin:  Skin is warm, dry and intact.  Psychiatric: Mood and affect are normal.  ____________________________________________    EKG  EKG viewed and interpreted by myself shows a normal sinus rhythm at 68 bpm with a narrow QRS, normal axis, normal intervals, nonspecific ST changes.  ____________________________________________     RADIOLOGY  IMPRESSION:  1. Findings consistent with a partial small bowel obstruction.  2. Nonobstructing renal calculi within the left kidney.  3. Evidence of prior cholecystectomy.  4. Small right adnexal cyst, likely ovarian in origin.   ____________________________________________   INITIAL IMPRESSION / ASSESSMENT AND PLAN / ED COURSE  Pertinent labs & imaging results that were available during my care of the patient were reviewed by me and considered in my medical decision making (see chart for details).   Patient presents to the emergency department for upper abdominal pain but also fairly diffuse abdominal pain ongoing over the past 3 to 4 days.  Reassuringly patient's labs are largely within normal limits including negative troponin, normal LFTs and a normal lipase.  However given the patient's tenderness palpation in the epigastrium and across the left  side of the abdomen we will proceed with CT imaging to rule out other acute abnormality such as colitis, diverticulitis.  Differential would also include gastritis or peptic ulcer disease.  Patient takes omeprazole daily.  Patient's CT scan showed a partial small bowel obstruction.  I discussed the patient with Dr. Celine Ahr.  We will place an NG tube and admit to the hospitalist service.  Surgery will consult.  Patient agreeable to plan.  Tyreonna Czaplicki was evaluated in Emergency Department on 02/13/2020 for the symptoms described in the history of present illness. She was evaluated in the context of the global COVID-19 pandemic, which necessitated consideration that the patient might be at risk for infection with the SARS-CoV-2 virus that causes COVID-19. Institutional protocols and algorithms that pertain to the evaluation of patients at risk for COVID-19 are in a state of rapid change based on information released by regulatory bodies including the CDC and federal and state organizations. These policies and algorithms were followed during  the patient's care in the ED.  ____________________________________________   FINAL CLINICAL IMPRESSION(S) / ED DIAGNOSES  Epigastric pain   Harvest Dark, MD 02/13/20 2049

## 2020-02-13 NOTE — H&P (Signed)
History and Physical    Breanna Ramos XHB:[id number] DOB: 06/29/1970 DOA: 02/13/2020  PCP: Gladstone Lighter, MD  Patient coming from: Home  I have personally briefly reviewed patient's old medical records in [place]  Chief Complaint: Abdominal pain  HPI: Breanna Ramos is a 49 y.o. 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  Patient states she had new onset of severe mid upper abdominal pain around 11:00 this morning.  She described the pain as if "a sword was going straight through" her abdomen and into her back.  She had associated nausea without emesis.  She says this felt very similar to her previous pancreatitis episode.  She reports chronic constipation with last bowel movement 2 days ago.  She says she has been seen some blood with bowel movements over the last month and has an appointment with GI on 02/17/2020.  She denies any subjective fevers, chills, diaphoresis, or dyspnea.  She reports good urine output without dysuria.  She came to the ED for further evaluation.  She reports current tobacco use, <1 pack/day.  She reports occasional alcohol use, about 1 beer per week.  She denies any illicit drug use.  She says she was recently started on Wellbutrin 2 weeks ago which was increased in dosing 1 week ago.  ED Course:  Initial vitals showed BP 151/95, pulse 71, RR 18, temp 98.3 Fahrenheit, SPO2 100% on room air.  Labs show WBC 10.4, hemoglobin 11.8, platelets 360,000, sodium 138, potassium 4.1, bicarb 28, BUN 15, creatinine 0.59, LFTs within normal limits, serum glucose 95, lipase 23, high-sensitivity troponin I 3x2.  Urinalysis is negative for UTI.  Urine pregnancy test is negative.  SARS-CoV-2 PCR is ordered and pending.  CT abdomen/pelvis  with contrast shows findings consistent with partial small bowel obstruction with transition point in the anterior aspect of the mid left abdomen.  Nonobstructing renal calculi within the left kidney, evidence of prior cholecystectomy, and small right adnexal cyst also noted.  Patient was given IV Zofran and fentanyl.  EDP discussed with on-call general surgery who will consult and recommended NG tube placement.  Hospitalist service was consulted to admit for further evaluation and management.  Review of Systems: All systems reviewed and are negative except as documented in history of present illness above.   Past Medical History:  Diagnosis Date  . Arthritis   . Diabetes mellitus without complication ([place])   . Fibromyalgia   . H/O cardiac radiofrequency ablation   . Hypertension   . Microprolactinoma [facility]     Past Surgical History:  Procedure Laterality Date  . CHOLECYSTECTOMY      Social History:  reports that she has been smoking. She has been smoking about 0.25 packs per day. She has never used smokeless tobacco. She reports current alcohol use. She reports that she does not use drugs.  Allergies  Allergen Reactions  . Codeine     Family History  Problem Relation Age of Onset  . Stroke Mother   . Diabetes Mother   . Heart disease Mother   . Breast cancer Mother   . Stroke Father      Prior to Admission medications   Medication Sig Start Date End Date Taking? Authorizing Provider  ascorbic acid (VITAMIN C) 500 MG tablet Take by mouth.    [provider]  benzonatate (TESSALON) 200 MG capsule Take 1 capsule (200 mg total) by mouth 3 (three) times daily as needed for cough. 12/12/19   Coral Spikes, DO  bisoprolol (ZEBETA) 5 MG tablet Take by mouth. 12/19/19   [provider]  buPROPion (WELLBUTRIN XL) 300 MG 24 hr tablet Take by mouth. 02/07/20 02/06/21  [provider]  cabergoline (DOSTINEX) 0.5 MG tablet Take 0.5 mg by mouth 2 (two) times a  week. 09/18/19   [provider]  celecoxib (CELEBREX) 200 MG capsule Take by mouth.    [provider]  Cholecalciferol 50 MCG (2000 UT) CAPS Take by mouth.    [provider]  cyanocobalamin 1000 MCG tablet Take by mouth.    [provider]  cyclobenzaprine (FLEXERIL) 10 MG tablet Take by mouth. 03/01/16   [provider]  diltiazem (TIAZAC) 240 MG 24 hr capsule Take by mouth.    [provider]  famciclovir (FAMVIR) 500 MG tablet Take by mouth.    [provider]  FLUoxetine (PROZAC) 40 MG capsule Take 40 mg by mouth daily.    [provider]  gabapentin (NEURONTIN) 300 MG capsule Take 300 mg by mouth 2 (two) times daily. 09/26/19   [provider]  ipratropium (ATROVENT) 0.06 % nasal spray Place 2 sprays into both nostrils 4 (four) times daily as needed for rhinitis. 12/12/19   Coral Spikes, DO  loratadine (CLARITIN) 10 MG tablet Take by mouth.    [provider]  LORazepam (ATIVAN) 1 MG tablet Take by mouth.    [provider]  Magnesium Oxide 500 MG TABS Take 1 tablet by mouth daily.    [provider]  Melatonin 5 MG CAPS Take by mouth.    [provider]  metFORMIN (GLUCOPHAGE-XR) 500 MG 24 hr tablet Take by mouth. 02/23/16   [provider]  Multiple Vitamin (MULTI-VITAMIN) tablet Take 1 tablet by mouth daily.    [provider]  omeprazole (PRILOSEC) 40 MG capsule Take by mouth. 02/21/16   [provider]  ondansetron (ZOFRAN) 4 MG tablet Take 4 mg by mouth every 8 (eight) hours as needed for nausea or vomiting.    [provider]  oxyCODONE (OXYCONTIN) 10 mg 12 hr tablet Take by mouth. 03/02/16   [provider]  oxyCODONE-acetaminophen (PERCOCET/ROXICET) 5-325 MG tablet Take by mouth. 03/02/16   [provider]  topiramate (TOPAMAX) 50 MG tablet Take by mouth.    [provider]    Physical Exam: Vitals:    02/13/20 1611 02/13/20 1612  BP: (!) 151/95   Pulse: 71   Resp: 18   Temp: 98.3 F (36.8 C)   TempSrc: Oral   SpO2: 100%   Weight:  83.5 kg  Height:  5' 6"  (1.676 m)   Constitutional: Resting supine in bed, NAD, calm, comfortable Eyes: PERRL, lids and conjunctivae normal ENMT: Mucous membranes are moist. Posterior pharynx clear of any exudate or lesions.Normal dentition.  Neck: normal, supple, no masses. Respiratory: clear to auscultation bilaterally, no wheezing, no crackles. Normal respiratory effort. No accessory muscle use.  Cardiovascular: Regular rate and rhythm, no murmurs / rubs / gallops. No extremity edema. 2+ pedal pulses. Abdomen: Tender to palpation in the epigastric and periumbilical regions, no masses palpated. No hepatosplenomegaly. Bowel sounds hypoactive.  Musculoskeletal: no clubbing / cyanosis. No joint deformity upper and lower extremities. Good ROM, no contractures. Normal muscle tone.  Skin: no rashes, lesions, ulcers. No induration Neurologic: CN 2-12  grossly intact. Sensation intact, Strength 5/5 in all 4.  Psychiatric: Normal judgment and insight. Alert and oriented x 3. Normal mood.   Labs on Admission: I have personally reviewed following labs and imaging studies  CBC: Recent Labs  Lab 02/13/20 1617  WBC 10.4  HGB 11.8*  HCT 35.4*  MCV 91.0  PLT 702   Basic Metabolic Panel: Recent Labs  Lab 02/13/20 1617  NA 138  K 4.1  CL 101  CO2 28  GLUCOSE 95  BUN 15  CREATININE 0.59  CALCIUM 8.9   GFR: Estimated Creatinine Clearance: 94.7 mL/min (by C-G formula based on SCr of 0.59 mg/dL). Liver Function Tests: Recent Labs  Lab 02/13/20 1617  AST 20  ALT 25  ALKPHOS 80  BILITOT 0.6  PROT 7.0  ALBUMIN 4.0   Recent Labs  Lab 02/13/20 1617  LIPASE 23   No results for input(s): AMMONIA in the last 168 hours. Coagulation Profile: No results for input(s): INR, PROTIME in the last 168 hours. Cardiac Enzymes: No results for input(s):  CKTOTAL, CKMB, CKMBINDEX, TROPONINI in the last 168 hours. BNP (last 3 results) No results for input(s): PROBNP in the last 8760 hours. HbA1C: No results for input(s): HGBA1C in the last 72 hours. CBG: No results for input(s): GLUCAP in the last 168 hours. Lipid Profile: No results for input(s): CHOL, HDL, LDLCALC, TRIG, CHOLHDL, LDLDIRECT in the last 72 hours. Thyroid Function Tests: No results for input(s): TSH, T4TOTAL, FREET4, T3FREE, THYROIDAB in the last 72 hours. Anemia Panel: No results for input(s): VITAMINB12, FOLATE, FERRITIN, TIBC, IRON, RETICCTPCT in the last 72 hours. Urine analysis:    Component Value Date/Time   COLORURINE YELLOW (A) 02/13/2020 1617   APPEARANCEUR CLEAR (A) 02/13/2020 1617   LABSPEC 1.013 02/13/2020 1617   PHURINE 7.0 02/13/2020 1617   GLUCOSEU NEGATIVE 02/13/2020 1617   HGBUR NEGATIVE 02/13/2020 1617   BILIRUBINUR NEGATIVE 02/13/2020 1617   KETONESUR NEGATIVE 02/13/2020 1617   PROTEINUR NEGATIVE 02/13/2020 1617   NITRITE NEGATIVE 02/13/2020 1617   LEUKOCYTESUR NEGATIVE 02/13/2020 1617    Radiological Exams on Admission: CT ABDOMEN PELVIS W CONTRAST  Result Date: 02/13/2020 CLINICAL DATA:  Chest pain and abdominal pain. EXAM: CT ABDOMEN AND PELVIS WITH CONTRAST TECHNIQUE: Multidetector CT imaging of the abdomen and pelvis was performed using the standard protocol following bolus administration of intravenous contrast. CONTRAST:  166m OMNIPAQUE IOHEXOL 300 MG/ML  SOLN COMPARISON:  None. FINDINGS: Lower chest: No acute abnormality. Hepatobiliary: No focal liver abnormality is seen. Status post cholecystectomy. No biliary dilatation. Pancreas: Unremarkable. No pancreatic ductal dilatation or surrounding inflammatory changes. Spleen: Normal in size without focal abnormality. Adrenals/Urinary Tract: Adrenal glands are unremarkable. Kidneys are normal in size, without focal lesions or hydronephrosis. A 6 mm nonobstructing renal stone is seen within the mid  to upper left kidney, with a 7 mm nonobstructing renal stone noted within the lower pole of the left kidney. Bladder is unremarkable. Stomach/Bowel: The stomach is moderately distended. Appendix appears normal. Multiple dilated small bowel loops are seen throughout the abdomen and pelvis (maximum small bowel diameter of approximately 3.6 cm). A transition zone is seen within the anterior aspect of the mid left abdomen (axial CT images 41 through 56, CT series number 2). Vascular/Lymphatic: No significant vascular findings are present. No enlarged abdominal or pelvic lymph nodes. Reproductive: The uterus is normal in appearance. A 1.3 cm diameter cyst is seen along the right adnexa. Other: No abdominal wall hernia or abnormality. No abdominopelvic ascites. Musculoskeletal:  No acute or significant osseous findings. IMPRESSION: 1. Findings consistent with a partial small bowel obstruction. 2. Nonobstructing renal calculi within the left kidney. 3. Evidence of prior cholecystectomy. 4. Small right adnexal cyst, likely ovarian in origin. Electronically Signed   By: Virgina Norfolk M.D.   On: 02/13/2020 20:26    EKG: Independently reviewed. Normal sinus rhythm without acute ischemic changes.  No prior for comparison.  Assessment/Plan Principal Problem:   Partial small bowel obstruction (HCC) Active Problems:   Diabetes mellitus without complication (HCC)   Microprolactinoma (HCC)   Essential hypertension   Paroxysmal SVT (supraventricular tachycardia) (HCC)  Breanna Ramos is a 49 y.o. 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  Partial small bowel obstruction: Seen on CT imaging.  Patient has continued abdominal pain.  Not actively vomiting. -General surgery consulted and will see in a.m.,  appreciate assistance -Keep n.p.o. -Start IV fluid hydration overnight -Continue pain control with IV morphine as needed -Antiemetics as needed -NG tube order to be placed  Paroxysmal SVT: S/p radiofrequency ablation 2018 Per prior documentation.  Takes bisoprolol and diltiazem at home.  In sinus rhythm on admission.  Hold meds on hold while NPO.  Continue to monitor and can use IV beta-blocker if needed.  Type 2 diabetes: Well controlled with last A1c 5.7% on 12/19/2019.  Hold home Metformin while NPO.  Hypertension: Currently stable.  Holding home bisoprolol and diltiazem while NPO.  Prolactinoma: Controlled on cabergoline every Tuesday and Saturday.  Meds on hold while NPO.  Depression/anxiety: Home meds on hold while NPO.  Chronic back pain: With reported prior cervical and lumbar surgeries for degenerative disc disease.  Follows with pain management and takes oxycodone-acetaminophen 10-325 mg q6h prn.  Oral meds on hold, continue pain control with IV analgesics as above.  Tobacco use:  Reports smoking less than 1 pack/day.  Nicotine patch ordered.  DVT prophylaxis: SCDs Code Status: Full code, confirmed with patient Family Communication: Discussed with patient, she has discussed with her significant other Disposition Plan: From home and likely discharge to home pending resolution of SBO and ability maintain adequate oral intake. Consults called: General surgery Admission status:  Status is: Observation  The patient remains OBS appropriate and will d/c before 2 midnights.  Dispo: The patient is from: Home              Anticipated d/c is to: Home              Anticipated d/c date is: 1 day              Patient currently is not medically stable to d/c.   Zada Finders MD [facility]  If 7PM-7AM, please contact night-coverage [email or web address]  02/13/2020, 9:17 PM

## 2020-02-13 NOTE — ED Notes (Signed)
This RN and Neva Seat attempted to place NG tube per doctor order. NG tube unable to be advanced on 3-4 attempts combined by both RN's. Attempting to find other nurse who might be able to insert. Pt tolerated as well as could be expected,  No further needs noted at this time.

## 2020-02-14 ENCOUNTER — Observation Stay: Payer: BLUE CROSS/BLUE SHIELD

## 2020-02-14 DIAGNOSIS — F419 Anxiety disorder, unspecified: Secondary | ICD-10-CM | POA: Diagnosis present

## 2020-02-14 DIAGNOSIS — I10 Essential (primary) hypertension: Secondary | ICD-10-CM | POA: Diagnosis present

## 2020-02-14 DIAGNOSIS — Z833 Family history of diabetes mellitus: Secondary | ICD-10-CM | POA: Diagnosis not present

## 2020-02-14 DIAGNOSIS — F17210 Nicotine dependence, cigarettes, uncomplicated: Secondary | ICD-10-CM | POA: Diagnosis present

## 2020-02-14 DIAGNOSIS — R1084 Generalized abdominal pain: Secondary | ICD-10-CM | POA: Diagnosis not present

## 2020-02-14 DIAGNOSIS — M797 Fibromyalgia: Secondary | ICD-10-CM | POA: Diagnosis present

## 2020-02-14 DIAGNOSIS — D352 Benign neoplasm of pituitary gland: Secondary | ICD-10-CM | POA: Diagnosis present

## 2020-02-14 DIAGNOSIS — Z823 Family history of stroke: Secondary | ICD-10-CM | POA: Diagnosis not present

## 2020-02-14 DIAGNOSIS — E119 Type 2 diabetes mellitus without complications: Secondary | ICD-10-CM | POA: Diagnosis present

## 2020-02-14 DIAGNOSIS — K56609 Unspecified intestinal obstruction, unspecified as to partial versus complete obstruction: Secondary | ICD-10-CM | POA: Diagnosis present

## 2020-02-14 DIAGNOSIS — Z20822 Contact with and (suspected) exposure to covid-19: Secondary | ICD-10-CM | POA: Diagnosis present

## 2020-02-14 DIAGNOSIS — Z79890 Hormone replacement therapy (postmenopausal): Secondary | ICD-10-CM | POA: Diagnosis not present

## 2020-02-14 DIAGNOSIS — I471 Supraventricular tachycardia: Secondary | ICD-10-CM | POA: Diagnosis present

## 2020-02-14 DIAGNOSIS — K56600 Partial intestinal obstruction, unspecified as to cause: Secondary | ICD-10-CM | POA: Diagnosis present

## 2020-02-14 DIAGNOSIS — Z885 Allergy status to narcotic agent status: Secondary | ICD-10-CM | POA: Diagnosis not present

## 2020-02-14 DIAGNOSIS — N200 Calculus of kidney: Secondary | ICD-10-CM | POA: Diagnosis present

## 2020-02-14 DIAGNOSIS — M1990 Unspecified osteoarthritis, unspecified site: Secondary | ICD-10-CM | POA: Diagnosis present

## 2020-02-14 DIAGNOSIS — M549 Dorsalgia, unspecified: Secondary | ICD-10-CM | POA: Diagnosis present

## 2020-02-14 DIAGNOSIS — K5900 Constipation, unspecified: Secondary | ICD-10-CM | POA: Diagnosis not present

## 2020-02-14 DIAGNOSIS — Z7984 Long term (current) use of oral hypoglycemic drugs: Secondary | ICD-10-CM | POA: Diagnosis not present

## 2020-02-14 DIAGNOSIS — G8929 Other chronic pain: Secondary | ICD-10-CM | POA: Diagnosis present

## 2020-02-14 DIAGNOSIS — Z79891 Long term (current) use of opiate analgesic: Secondary | ICD-10-CM | POA: Diagnosis not present

## 2020-02-14 DIAGNOSIS — F329 Major depressive disorder, single episode, unspecified: Secondary | ICD-10-CM | POA: Diagnosis present

## 2020-02-14 DIAGNOSIS — Z9049 Acquired absence of other specified parts of digestive tract: Secondary | ICD-10-CM | POA: Diagnosis not present

## 2020-02-14 DIAGNOSIS — Z8249 Family history of ischemic heart disease and other diseases of the circulatory system: Secondary | ICD-10-CM | POA: Diagnosis not present

## 2020-02-14 DIAGNOSIS — Z803 Family history of malignant neoplasm of breast: Secondary | ICD-10-CM | POA: Diagnosis not present

## 2020-02-14 DIAGNOSIS — Z79899 Other long term (current) drug therapy: Secondary | ICD-10-CM | POA: Diagnosis not present

## 2020-02-14 LAB — BASIC METABOLIC PANEL
Anion gap: 6 (ref 5–15)
BUN: 9 mg/dL (ref 6–20)
CO2: 29 mmol/L (ref 22–32)
Calcium: 8.4 mg/dL — ABNORMAL LOW (ref 8.9–10.3)
Chloride: 105 mmol/L (ref 98–111)
Creatinine, Ser: 0.44 mg/dL (ref 0.44–1.00)
GFR calc Af Amer: >60 mL/min (ref >60)
GFR calc non Af Amer: >60 mL/min (ref >60)
Glucose, Bld: 93 mg/dL (ref 70–99)
Potassium: 3.6 mmol/L (ref 3.5–5.1)
Sodium: 140 mmol/L (ref 135–145)

## 2020-02-14 LAB — CBC
HCT: 35.3 % — ABNORMAL LOW (ref 36.0–46.0)
Hemoglobin: 11.7 g/dL — ABNORMAL LOW (ref 12.0–15.0)
MCH: 30.2 pg (ref 26.0–34.0)
MCHC: 33.1 g/dL (ref 30.0–36.0)
MCV: 91 fL (ref 80.0–100.0)
Platelets: 313 10*3/uL (ref 150–400)
RBC: 3.88 MIL/uL (ref 3.87–5.11)
RDW: 13.5 % (ref 11.5–15.5)
WBC: 7.7 10*3/uL (ref 4.0–10.5)
nRBC: 0 % (ref 0.0–0.2)

## 2020-02-14 LAB — HIV ANTIBODY (ROUTINE TESTING W REFLEX): HIV Screen 4th Generation wRfx: NONREACTIVE

## 2020-02-14 MED ORDER — MAGNESIUM CITRATE PO SOLN
1.0000 | Freq: Once | ORAL | Status: AC
  Administered 2020-02-14: 1 via ORAL
  Filled 2020-02-14: qty 296

## 2020-02-14 MED ORDER — PNEUMOCOCCAL VAC POLYVALENT 25 MCG/0.5ML IJ INJ: 0.5000 mL | INJECTION | INTRAMUSCULAR | Status: DC

## 2020-02-14 MED ORDER — POLYETHYLENE GLYCOL 3350 17 G PO PACK
17.0000 g | PACK | Freq: Two times a day (BID) | ORAL | Status: DC
  Administered 2020-02-14 – 2020-02-15 (×3): 17 g via ORAL
  Filled 2020-02-14 (×3): qty 1

## 2020-02-14 MED ORDER — DOCUSATE SODIUM 100 MG PO CAPS
100.0000 mg | ORAL_CAPSULE | Freq: Every day | ORAL | Status: DC
  Administered 2020-02-14 – 2020-02-15 (×2): 100 mg via ORAL
  Filled 2020-02-14 (×2): qty 1

## 2020-02-14 MED ORDER — INFLUENZA VAC SPLIT QUAD 0.5 ML IM SUSY: 0.5000 mL | PREFILLED_SYRINGE | INTRAMUSCULAR | Status: DC

## 2020-02-14 NOTE — Progress Notes (Signed)
PROGRESS NOTE    Patient: Breanna Ramos                            PCP: Gladstone Lighter, MD                    DOB: 1970/07/22            DOA: 02/13/2020 [id number]:[id number]             DOS: 02/14/2020, 7:47 AM   LOS: 0 days   Date of Service: The patient was seen and examined on 02/14/2020  Subjective:   The patient was seen and examined this Am. Reporting she might some gas but no bowel movements. Admits that she is on scheduled heavy narcotics due to chronic back pain-follows with pain clinic  Could not tolerate NG tube last night Not complaining of nausea vomiting this morning Reporting improved abdominal pain   Brief Narrative:  Breanna Ramos is a 49 y.o. 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, chronic back pain, and depression/anxiety who is admitted with partial SBO   Assessment & Plan:   Principal Problem:   Partial small bowel obstruction (HCC) Active Problems:   Diabetes mellitus without complication ([place])   Microprolactinoma ([place])   Essential hypertension   Paroxysmal SVT (supraventricular tachycardia) (HCC)  Partial small bowel obstruction: -Improved abdominal pain -NG tube cannot be inserted overnight-withholding -Improved nausea vomiting, no episodes overnight or this morning -As needed antiemetics -We will keep the patient n.p.o., continue IV fluid hydration -General surgery consulted follow accordingly -Encourage ambulation  Seen on CT imaging.  Patient has continued abdominal pain.  Not actively vomiting.  -Continue pain control with IV morphine as needed   Paroxysmal SVT: -Currently stable heart rate 69 (69-80 S/p radiofrequency ablation 2018 Per prior documentation.  Takes bisoprolol and diltiazem at home.  In sinus rhythm on admission.  Hold meds on hold while NPO.  Continue to monitor and can use IV beta-blocker if  needed.  Type 2 diabetes: Well controlled with last A1c 5.7% on 12/19/2019.  Hold home Metformin while NPO. -Monitoring CBG every 4- 6 hours, with SSI coverage as needed  Hypertension: Currently stable   Holding home bisoprolol and diltiazem while NPO.  Prolactinoma: Stable Controlled on cabergoline every Tuesday and Saturday.  Meds on hold while NPO.  Depression/anxiety: Home meds on hold while NPO.  Chronic back pain: Stable.  IV analgesics With reported prior cervical and lumbar surgeries for degenerative disc disease.  Follows with pain management and takes oxycodone-acetaminophen 10-325 mg q6h prn.  Oral meds on hold,    Tobacco use:  Reports smoking less than 1 pack/day.  Nicotine patch ordered.  DVT prophylaxis: SCDs Code Status: Full code, confirmed with patient Family Communication: Discussed with patient, she has discussed with her significant other Disposition Plan: From home and likely discharge to home pending resolution of SBO and ability maintain adequate oral intake. Consults called: General surgery Admission status:  Status is: Observation  The patient remains OBS appropriate and will d/c before 2 midnights.  Dispo: The patient is from: Home  Anticipated d/c is to: Home  Anticipated d/c date is: 1 day  Patient currently is not medically stable to d/c.   Consultants: General surgery         Procedures:   No admission procedures for hospital encounter.  Antimicrobials:  Anti-infectives (From admission, onward)   None       Medication:  . [START ON 02/15/2020] influenza vac split quadrivalent PF  0.5 mL Intramuscular Tomorrow-1000  . nicotine  7 mg Transdermal Daily  . [START ON 02/15/2020] pneumococcal 23 valent vaccine  0.5 mL Intramuscular Tomorrow-1000    morphine injection, ondansetron **OR** ondansetron (ZOFRAN) IV   Objective:   Vitals:   02/13/20 2206 02/13/20 2300 02/13/20  2311 02/14/20 0509  BP: (!) 142/93  (!) 150/99 114/69  Pulse: 80  74 69  Resp: (!) 22  20 20   Temp:   98.7 F (37.1 C) 98.1 F (36.7 C)  TempSrc:   Oral Oral  SpO2: 98%  100% 98%  Weight:  86 kg    Height:  5' 6"  (1.676 m)      Intake/Output Summary (Last 24 hours) at 02/14/2020 0747 Last data filed at 02/14/2020 0440 Gross per 24 hour  Intake 630 ml  Output --  Net 630 ml   Filed Weights   02/13/20 1612 02/13/20 2300  Weight: 83.5 kg 86 kg     Examination:   Physical Exam  Constitution:  Alert, cooperative, no distress,  Appears calm and comfortable  Psychiatric: Normal and stable mood and affect, cognition intact,   HEENT: Normocephalic, PERRL, otherwise with in Normal limits  Chest:Chest symmetric Cardio vascular:  S1/S2, RRR, No murmure, No Rubs or Gallops  pulmonary: Clear to auscultation bilaterally, respirations unlabored, negative wheezes / crackles Abdomen: Soft nontender nondistended, hypoactive bowel sounds negative any mass or organomegaly Muscular skeletal: Limited exam - in bed, able to move all 4 extremities, Normal strength,  Neuro: CNII-XII intact. , normal motor and sensation, reflexes intact  Extremities: No pitting edema lower extremities, +2 pulses  Skin: Dry, warm to touch, negative for any Rashes, No open wounds Wounds: per nursing documentation    ------------------------------------------------------------------------------------------------------------------------------------------    LABs:  CBC Latest Ref Rng & Units 02/14/2020 02/13/2020  WBC 4.0 - 10.5 K/uL 7.7 10.4  Hemoglobin 12.0 - 15.0 g/dL 11.7(L) 11.8(L)  Hematocrit 36 - 46 % 35.3(L) 35.4(L)  Platelets 150 - 400 K/uL 313 360   CMP Latest Ref Rng & Units 02/14/2020 02/13/2020  Glucose 70 - 99 mg/dL 93 95  BUN 6 - 20 mg/dL 9 15  Creatinine 0.44 - 1.00 mg/dL 0.44 0.59  Sodium 135 - 145 mmol/L 140 138  Potassium 3.5 - 5.1 mmol/L 3.6 4.1  Chloride 98 - 111 mmol/L 105 101  CO2 22 - 32  mmol/L 29 28  Calcium 8.9 - 10.3 mg/dL 8.4(L) 8.9  Total Protein 6.5 - 8.1 g/dL - 7.0  Total Bilirubin 0.3 - 1.2 mg/dL - 0.6  Alkaline Phos 38 - 126 U/L - 80  AST 15 - 41 U/L - 20  ALT 0 - 44 U/L - 25       Micro Results Recent Results (from the past 240 hour(s))  SARS Coronavirus 2 by RT PCR (hospital order, performed in [place] hospital lab) Nasopharyngeal Nasopharyngeal Swab     Status: None   Collection Time: 02/13/20  9:41 PM   Specimen: Nasopharyngeal Swab  Result Value Ref Range Status   SARS Coronavirus 2 NEGATIVE NEGATIVE Final    Comment: (NOTE) SARS-CoV-2 target nucleic acids are NOT DETECTED.  The SARS-CoV-2 RNA is generally detectable in upper and lower respiratory specimens during the acute phase of infection. The lowest concentration of SARS-CoV-2 viral copies this assay can detect is 250 copies / mL.  A negative result does not preclude SARS-CoV-2 infection and should not be used as the sole basis for treatment or other patient management decisions.  A negative result may occur with improper specimen collection / handling, submission of specimen other than nasopharyngeal swab, presence of viral mutation(s) within the areas targeted by this assay, and inadequate number of viral copies (<250 copies / mL). A negative result must be combined with clinical observations, patient history, and epidemiological information.  Fact Sheet for Patients:   StrictlyIdeas.no  Fact Sheet for Healthcare Providers: BankingDealers.co.za  This test is not yet approved or  cleared by the [place] FDA and has been authorized for detection and/or diagnosis of SARS-CoV-2 by FDA under an Emergency Use Authorization (EUA).  This EUA will remain in effect (meaning this test can be used) for the duration of the COVID-19 declaration under Section 564(b)(1) of the Act, 21 U.S.C. section 360bbb-3(b)(1), unless the authorization is  terminated or revoked sooner.  Performed at [facility], [address], [place], [place] [address]     Radiology Reports CT ABDOMEN PELVIS W CONTRAST  Result Date: 02/13/2020 CLINICAL DATA:  Chest pain and abdominal pain. EXAM: CT ABDOMEN AND PELVIS WITH CONTRAST TECHNIQUE: Multidetector CT imaging of the abdomen and pelvis was performed using the standard protocol following bolus administration of intravenous contrast. CONTRAST:  121m OMNIPAQUE IOHEXOL 300 MG/ML  SOLN COMPARISON:  None. FINDINGS: Lower chest: No acute abnormality. Hepatobiliary: No focal liver abnormality is seen. Status post cholecystectomy. No biliary dilatation. Pancreas: Unremarkable. No pancreatic ductal dilatation or surrounding inflammatory changes. Spleen: Normal in size without focal abnormality. Adrenals/Urinary Tract: Adrenal glands are unremarkable. Kidneys are normal in size, without focal lesions or hydronephrosis. A 6 mm nonobstructing renal stone is seen within the mid to upper left kidney, with a 7 mm nonobstructing renal stone noted within the lower pole of the left kidney. Bladder is unremarkable. Stomach/Bowel: The stomach is moderately distended. Appendix appears normal. Multiple dilated small bowel loops are seen throughout the abdomen and pelvis (maximum small bowel diameter of approximately 3.6 cm). A transition zone is seen within the anterior aspect of the mid left abdomen (axial CT images 41 through 56, CT series number 2). Vascular/Lymphatic: No significant vascular findings are present. No enlarged abdominal or pelvic lymph nodes. Reproductive: The uterus is normal in appearance. A 1.3 cm diameter cyst is seen along the right adnexa. Other: No abdominal wall hernia or abnormality. No abdominopelvic ascites. Musculoskeletal: No acute or significant osseous findings. IMPRESSION: 1. Findings consistent with a partial small bowel obstruction. 2. Nonobstructing renal calculi within the left kidney. 3.  Evidence of prior cholecystectomy. 4. Small right adnexal cyst, likely ovarian in origin. Electronically Signed   By: TVirgina NorfolkM.D.   On: 02/13/2020 20:26    SIGNED: SDeatra James MD, FACP, FHM. [facility],  Pager (please use [email or web address] to page/text)  If 7PM-7AM, please contact night-coverage [email or web address] County Health Services9/21/2021, 7:47 AM

## 2020-02-14 NOTE — Progress Notes (Addendum)
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 General surgery was consulted and recommended NPO, NGT and IVF. Several attempts were made by nursing staff to place NGT including one attempt by me but was unsuccessful. On further questioning, patient stated that she has hx of traumatic injury to her nose as a child complicated by septal deviation and perforation. Given she is currently asymptomatic with non-distended stomach, will hold of NGT and consider placement by IR in the am. Will continue NPO and IVFs in the interim.    Rufina Falco, BSN, MSN, DNP, [employer]  [facility] Nurse Practitioner  [facility]

## 2020-02-14 NOTE — Progress Notes (Signed)
   02/14/20 1530  Clinical Encounter Type  Visited With Patient  Visit Type Initial;Spiritual support;Social support  Referral From Nurse  Consult/Referral To Chaplain  Ch did an AD education for Pt per OR. I told the Pt when she is ready page the Ch and I will help her get it finished. Ch will follow-up with Pt.

## 2020-02-14 NOTE — Progress Notes (Signed)
Initial Nutrition Assessment  DOCUMENTATION CODES:   Obesity unspecified  INTERVENTION:   RD will monitor for diet advancement vs the need for nutrition support  NUTRITION DIAGNOSIS:   Inadequate oral intake related to acute illness (SBO) as evidenced by NPO status.  GOAL:   Patient will meet greater than or equal to 90% of their needs  MONITOR:   Diet advancement, Labs, Weight trends, Skin, I & O's  REASON FOR ASSESSMENT:   Malnutrition Screening Tool    ASSESSMENT:   49 y.o. 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   Pt with abdominal pain and nausea that started the day of her admit. Pt admitted with partial SBO vs adynamic ileus. NGT unable to be placed secondary to h/o nasal fracture. Pt is currently NPO. RD will monitor for diet advancement vs the need for nutrition support.   Per chart, pt down 15lbs(7%) since 02/2019. Pt appears weight stable from her last documented weight on 7/19.   Medications reviewed and include: colace, nicotine, miralax, LRS @125ml /hr  Labs reviewed:   Nutrition-Focused physical exam completed. Findings are no fat depletion, no muscle depletion and no edema.   Diet Order:   Diet Order            Diet NPO time specified  Diet effective now                EDUCATION NEEDS:   No education needs have been identified at this time  Skin:  Skin Assessment: Reviewed RN Assessment  Last BM:  pta  Height:   Ht Readings from Last 1 Encounters:  02/13/20 5' 6"  (1.676 m)    Weight:   Wt Readings from Last 1 Encounters:  02/13/20 86 kg    Ideal Body Weight:  59 kg  BMI:  Body mass index is 30.6 kg/m.  Estimated Nutritional Needs:   Kcal:  1800-2100kcal/day  Protein:  90-105g/day  Fluid:  >1.8  Koleen Distance  MS, RD, LDN Please refer to [facility] for RD and/or RD on-call/weekend/after hours pager

## 2020-02-14 NOTE — Consult Note (Addendum)
[facility] SURGICAL CONSULTATION NOTE (initial) - cpt: 17616   HISTORY OF PRESENT ILLNESS (HPI):  49 y.o. female presented to [facility] ED overnight for evaluation of abdominal pain. Patient reports the acute onset of epigastric abdominal discomfort yesterday afternoon around 11am. This was a pressure like pain which radiated through to her back. She had similar pain in the past when she had pancreatitis. Nothing seemed to make this better and further PO intake exacerbated her pain. She had associated nausea, but this has since resolved. No fever, chills, cough, congestion, SOB, urinary changes. She does report associated constipation. Last BM was 3 days ago. She is on chronic narcotics secondary to lumbar and cervical fusions. She takes 10 mg percocet 3-4 times daily as needed. No history of obstruction in the past. Previous abdominal surgeries positive for c-section x1 and laparoscopic cholecystectomy. Work up in the ED was relatively reassuring however CT Abdomen/Pelvis was concerning for gastric distension, dilated small bowel loops, and significant constipation. She was admitted to medicine service with possible SBO. NGT placement was attempted x5 but failed secondary to history of nasal fracture.   Surgery is consulted by emergency medicine physician Dr. Harvest Dark, MD in this context for evaluation and management of likely pSBO.  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 Authorizing Provider  ascorbic acid (VITAMIN C) 500 MG tablet Take 500 mg by mouth daily.    Yes [provider]  bisoprolol (ZEBETA) 5 MG tablet Take 2.5 mg by mouth daily.  12/19/19  Yes [provider]  buPROPion (WELLBUTRIN XL) 300 MG 24 hr tablet Take 300 mg by mouth daily.  02/07/20 02/06/21 Yes [provider]  cabergoline (DOSTINEX) 0.5 MG tablet Take 0.5 mg by mouth 2 (two) times a week. Tuesday/Saturday 09/18/19  Yes [provider]  celecoxib (CELEBREX) 200 MG capsule Take 200 mg by mouth at bedtime.    Yes [provider]  Cholecalciferol 50 MCG (2000 UT) CAPS Take 2,000 Units by mouth daily.    Yes [provider]  cyanocobalamin 1000 MCG tablet Take 1,000 mcg by mouth daily.    Yes [provider]  cyclobenzaprine (FLEXERIL) 10 MG tablet Take 10 mg by mouth 2 (two) times daily.  03/01/16  Yes [provider]  diltiazem (TIAZAC) 240 MG 24 hr capsule Take 240 mg by mouth daily.    Yes [provider]  FLUoxetine (PROZAC) 40 MG capsule Take 40 mg by mouth daily.   Yes [provider]  gabapentin (NEURONTIN) 300 MG capsule Take 300 mg by mouth daily.  09/26/19  Yes [provider]  loratadine (CLARITIN) 10 MG tablet Take 10 mg by mouth daily.    Yes [provider]  LORazepam (ATIVAN) 1 MG tablet Take 1 mg by mouth daily as needed for anxiety.    Yes [provider]  Magnesium Oxide 500 MG TABS Take 1 tablet by mouth daily.   Yes [provider]  Melatonin 5 MG CAPS Take 5 mg by mouth at bedtime.    Yes  [provider]  metFORMIN (GLUCOPHAGE) 500 MG tablet Take 1,000 mg by mouth 2 (two) times daily with a meal.   Yes [provider]  Multiple Vitamin (MULTI-VITAMIN) tablet Take 1 tablet by mouth daily.   Yes [provider]  omeprazole (PRILOSEC) 40 MG capsule Take 40 mg by mouth daily.  02/21/16  Yes [provider]  ondansetron (ZOFRAN) 4 MG tablet Take 4 mg by mouth every 8 (eight) hours as needed for nausea or vomiting.   Yes [provider]  oxyCODONE-acetaminophen (PERCOCET) 10-325 MG tablet Take 1 tablet by mouth every 6 (six) hours as needed for pain.  03/02/16  Yes [provider]     ALLERGIES:  Allergies  Allergen Reactions   Codeine Other (See Comments)    Cough syrup with codeine caused hyperactivity     SOCIAL HISTORY:  Social History   Socioeconomic History   Marital status: Married    Spouse name: Not on file   Number of children: Not on file   Years of education: Not on file   Highest education level: Not on file  Occupational History   Not on file  Tobacco Use   Smoking status: Current Every Day Smoker    Packs/day: 0.25   Smokeless tobacco: Never Used  Substance and Sexual Activity   Alcohol use: Yes    Comment: ~1 beer/week   Drug use: Never   Sexual activity: Not on file  Other Topics Concern   Not on file  Social History Narrative   Not on file   Social Determinants of Health   Financial Resource Strain:    Difficulty of Paying Living Expenses: Not on file  Food Insecurity:    Worried About [place] in the Last Year: Not on file   Ran Out of Food in the Last Year: Not on file  Transportation Needs:    Lack of Transportation (Medical): Not on file   Lack of Transportation (Non-Medical): Not on file  Physical Activity:    Days of Exercise per Week: Not on file   Minutes of Exercise per Session: Not on file  Stress:    Feeling of Stress : Not on file  Social Connections:    Frequency of Communication with Friends and Family: Not on file   Frequency of Social Gatherings with Friends and Family: Not on file   Attends Religious Services: Not on file   Active Member of Clubs or Organizations: Not on file   Attends Archivist Meetings: Not on file   Marital Status: Not on file  Intimate Partner Violence:    Fear of Current or Ex-Partner: Not on file   Emotionally Abused: Not on file   Physically Abused: Not on file   Sexually Abused: Not  on file     FAMILY HISTORY:  Family History  Problem Relation Age of Onset   Stroke Mother    Diabetes Mother    Heart disease Mother    Breast cancer Mother    Stroke Father       REVIEW OF SYSTEMS:  Review of Systems  Constitutional: Negative for chills and fever.  HENT: Negative for congestion and sore throat.   Respiratory: Negative for cough and shortness of breath.   Cardiovascular: Negative for chest pain and palpitations.  Gastrointestinal: Positive for abdominal pain, constipation and nausea (Resolved). Negative for diarrhea and vomiting.  Genitourinary: Negative for dysuria and urgency.  All other systems reviewed and are negative.  VITAL SIGNS:  Temp:  [98.1 F (36.7 C)-98.7 F (37.1 C)] 98.1 F (36.7 C) (09/21 0509) Pulse Rate:  [69-80] 69 (09/21 0509) Resp:  [18-22] 20 (09/21 0509) BP: (114-151)/(69-99) 114/69 (09/21 0509) SpO2:  [98 %-100 %] 98 % (09/21 0509) Weight:  [83.5 kg-86 kg] 86 kg (09/20 2300)     Height: 5' 6"  (167.6 cm) Weight: 86 kg BMI (Calculated): 30.62   INTAKE/OUTPUT:  09/20 0701 - 09/21 0700 In: 630 [I.V.:630] Out: -   PHYSICAL EXAM:  Physical Exam Vitals and nursing note reviewed. Exam conducted with a chaperone present.  Constitutional:      General: She is not in acute distress.    Appearance: She is well-developed. She is obese. She is not ill-appearing.  HENT:     Head: Normocephalic and atraumatic.  Eyes:     General: No scleral icterus.    Extraocular Movements: Extraocular movements intact.  Cardiovascular:     Rate and Rhythm: Normal rate and regular rhythm.     Heart sounds: Normal heart sounds. No murmur heard.   Pulmonary:     Effort: Pulmonary effort is normal. No respiratory distress.  Abdominal:     General: A surgical scar is present. There is no distension.     Palpations: Abdomen is soft.     Tenderness: There is abdominal tenderness (Very Mild) in the epigastric area. There is no guarding or rebound.      Comments: Abdomen is soft, very soreness in the epigastrium, non-distended, no tympany, no rebound/guarding/peritonitis  Genitourinary:    Comments: Deferred Skin:    General: Skin is warm and dry.     Coloration: Skin is not jaundiced or pale.  Neurological:     General: No focal deficit present.     Mental Status: She is alert and oriented to person, place, and time.  Psychiatric:        Mood and Affect: Mood normal.        Behavior: Behavior normal.      Labs:  CBC Latest Ref Rng & Units 02/14/2020 02/13/2020  WBC 4.0 - 10.5 K/uL 7.7 10.4  Hemoglobin 12.0 - 15.0 g/dL 11.7(L) 11.8(L)  Hematocrit 36 - 46 % 35.3(L) 35.4(L)  Platelets 150 - 400 K/uL 313 360   CMP Latest Ref Rng & Units 02/14/2020 02/13/2020  Glucose 70 - 99 mg/dL 93 95  BUN 6 - 20 mg/dL 9 15  Creatinine 0.44 - 1.00 mg/dL 0.44 0.59  Sodium 135 - 145 mmol/L 140 138  Potassium 3.5 - 5.1 mmol/L 3.6 4.1  Chloride 98 - 111 mmol/L 105 101  CO2 22 - 32 mmol/L 29 28  Calcium 8.9 - 10.3 mg/dL 8.4(L) 8.9  Total Protein 6.5 - 8.1 g/dL - 7.0  Total Bilirubin 0.3 - 1.2 mg/dL - 0.6  Alkaline Phos 38 - 126 U/L - 80  AST 15 - 41 U/L - 20  ALT 0 - 44 U/L - 25     Imaging studies:   CT Abdomen/Pelvis (02/13/2020) personally reviewed with marked distension of the stomach and dilated loops of bowel without clear transition, also markedly constipated, and radiologist report reviewed:  IMPRESSION: 1. Findings consistent with a partial small bowel obstruction. 2. Nonobstructing renal calculi within the left kidney. 3. Evidence of prior cholecystectomy. 4. Small right adnexal cyst, likely ovarian in origin.    KUB (02/14/2020) personally reviewed with improvement and contrast throughout colon, and radiologist report reviewed:  IMPRESSION: Contrast throughout colon. No findings indicating bowel obstruction currently.  No free air. Lung bases clear.   Assessment/Plan: (ICD-10's: K59.00) 49 y.o. female with epigastric  abdominal pain and nausea (which is resolved now) as well as marked constipation on CT, which I believe most likely represents some degree of adynamic ileus secondary to severe constipation and chronic narcotic use. Partial SBO is possible given surgical history however she has been passing flatus and without obvious transition point on imaging and contrast throughout colon on KUB today.    - We can hold off on NGT placement as symptoms improved and she is passing flatus. She understands that if she were to develop nausea, emesis, or worsening pain then we can consider placement of this.   - NPO for now; if she continues to clinically improve we could consider CLD later today  - I will order colace, Miralax, and magnesium citrate for bowel regimen, enema PRN  - Monitor abdominal examination; on-going bowel function  - Pain control prn (limit narcotics as feasible); antiemetics prn  - No surgical intervention  - Mobilization encouraged  - Further management per primary service    All of the above findings and recommendations were discussed with the patient, and all of patient's questions were answered to her expressed satisfaction.  Thank you for the opportunity to participate in this patient's care.   -- Edison Simon, PA-C [facility] 02/14/2020, 7:30 AM [phone] M-F: 7am - 4pm  Patient doing better at the time of my assessment. Start CLD. I saw and evaluated the patient.  I agree with the above documentation, exam, and plan, which I have edited where appropriate. Fredirick Maudlin  3:00 PM

## 2020-02-15 LAB — BASIC METABOLIC PANEL
Anion gap: 9 (ref 5–15)
BUN: 6 mg/dL (ref 6–20)
CO2: 27 mmol/L (ref 22–32)
Calcium: 8.5 mg/dL — ABNORMAL LOW (ref 8.9–10.3)
Chloride: 101 mmol/L (ref 98–111)
Creatinine, Ser: 0.43 mg/dL — ABNORMAL LOW (ref 0.44–1.00)
GFR calc Af Amer: >60 mL/min (ref >60)
GFR calc non Af Amer: >60 mL/min (ref >60)
Glucose, Bld: 100 mg/dL — ABNORMAL HIGH (ref 70–99)
Potassium: 3.6 mmol/L (ref 3.5–5.1)
Sodium: 137 mmol/L (ref 135–145)

## 2020-02-15 MED ORDER — DOCUSATE SODIUM 100 MG PO CAPS: 100.0000 mg | ORAL_CAPSULE | Freq: Every day | ORAL | 0 refills | Status: AC

## 2020-02-15 MED ORDER — POLYETHYLENE GLYCOL 3350 17 G PO PACK: 17.0000 g | PACK | Freq: Every day | ORAL | 0 refills | Status: AC

## 2020-02-15 NOTE — Discharge Summary (Signed)
Physician Discharge Summary  Breanna Ramos ACZ:[id number] DOB: 20-Apr-1971 DOA: 02/13/2020  PCP: Gladstone Lighter, MD  Admit date: 02/13/2020 Discharge date: 02/15/2020  Admitted From: Home Disposition: Home  Recommendations for Outpatient Follow-up:  1. Follow up with PCP in 1-2 weeks 2.   Home Health: No Equipment/Devices: None  Discharge Condition: Stable CODE STATUS: Full Diet recommendation: Soft diet Brief/Interim Summary: HPI: Breanna Ramos is a 49 y.o. 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  Patient states she had new onset of severe mid upper abdominal pain around 11:00 this morning.  She described the pain as if "a sword was going straight through" her abdomen and into her back.  She had associated nausea without emesis.  She says this felt very similar to her previous pancreatitis episode.  She reports chronic constipation with last bowel movement 2 days ago.  She says she has been seen some blood with bowel movements over the last month and has an appointment with GI on 02/17/2020.  She denies any subjective fevers, chills, diaphoresis, or dyspnea.  She reports good urine output without dysuria.  She came to the ED for further evaluation.  She reports current tobacco use, <1 pack/day.  She reports occasional alcohol use, about 1 beer per week.  She denies any illicit drug use.  She says she was recently started on Wellbutrin 2 weeks ago which was increased in dosing 1 week ago.  Patient was admitted to the medicine service with surgery consulting.  Attempted NG tube placement which the patient was unable to tolerate.  Given overall clinical improvement NG tube placement was deferred.  Patient continued to improve clinically.  Follow-up imaging did  not reveal any signs of obstruction.  Patient passing flatus.  Diet slowly advanced.  Tolerating soft diet at time of discharge.  Cleared for discharge per both medicine and general surgery.  No general surgery follow-up suggested or indicated on discharge.  Patient will follow up with PCP within 1 to 2 weeks of discharge.  Home bowel regimen prescribed to patient and patient educated on the importance of regular BMs.  She expressed understanding.  Discharged home in stable condition.   Discharge Diagnoses:  Principal Problem:   Partial small bowel obstruction (HCC) Active Problems:   Diabetes mellitus without complication (HCC)   Microprolactinoma (HCC)   Essential hypertension   Paroxysmal SVT (supraventricular tachycardia) (HCC)   SBO (small bowel obstruction) (HCC)  Partial small bowel obstruction: NG tube deferred due to patient intolerance.  Did not end up needing.  Abdominal pain improved.  Abdominal imaging showed resolution of obstruction.  Cleared for discharge per both medicine and general surgery.  Bowel regimen prescribed on discharge   Paroxysmal SVT: -Currently stable heart rate 69 (69-80 S/p radiofrequency ablation 2018 Per prior documentation.Takes bisoprolol and diltiazem at home. In sinus rhythm on admission. Home meds held while NPO.  Can continue home regimen on discharge.  Outpatient follow-up  Type 2 diabetes: Well controlled with last A1c 5.7% on 12/19/2019. Can continue home regimen on discharge  Hypertension: Currently stable Can continue home regimen on discharge  Prolactinoma: Stable Controlled on cabergoline every Tuesday and Saturday.Can continue home regimen on discharge  Depression/anxiety: Continue home regimen on discharge  Chronic back pain: Stable.  IV analgesics With reported prior cervical and lumbar surgeries for degenerative disc disease. Follows with  pain management and takes oxycodone-acetaminophen 10-325 mgq6h prn.All  medications continued on discharge however patient educated on the importance of regular bowel movements while on chronic narcotics  Tobacco use:  Reports smoking less than 1 pack/day. Nicotine patch ordered.   Discharge Instructions  Discharge Instructions    Diet - low sodium heart healthy   Complete by: As directed    Increase activity slowly   Complete by: As directed      Allergies as of 02/15/2020      Reactions   Codeine Other (See Comments)   Cough syrup with codeine caused hyperactivity      Medication List    TAKE these medications   ascorbic acid 500 MG tablet Commonly known as: VITAMIN C Take 500 mg by mouth daily.   bisoprolol 5 MG tablet Commonly known as: ZEBETA Take 2.5 mg by mouth daily.   buPROPion 300 MG 24 hr tablet Commonly known as: WELLBUTRIN XL Take 300 mg by mouth daily.   cabergoline 0.5 MG tablet Commonly known as: DOSTINEX Take 0.5 mg by mouth 2 (two) times a week. Tuesday/Saturday   celecoxib 200 MG capsule Commonly known as: CELEBREX Take 200 mg by mouth at bedtime.   Cholecalciferol 50 MCG (2000 UT) Caps Take 2,000 Units by mouth daily.   cyanocobalamin 1000 MCG tablet Take 1,000 mcg by mouth daily.   cyclobenzaprine 10 MG tablet Commonly known as: FLEXERIL Take 10 mg by mouth 2 (two) times daily.   diltiazem 240 MG 24 hr capsule Commonly known as: TIAZAC Take 240 mg by mouth daily.   docusate sodium 100 MG capsule Commonly known as: COLACE Take 1 capsule (100 mg total) by mouth daily. Start taking on: February 16, 2020   FLUoxetine 40 MG capsule Commonly known as: PROZAC Take 40 mg by mouth daily.   gabapentin 300 MG capsule Commonly known as: NEURONTIN Take 300 mg by mouth daily.   loratadine 10 MG tablet Commonly known as: CLARITIN Take 10 mg by mouth daily.   LORazepam 1 MG tablet Commonly known as: ATIVAN Take 1 mg by mouth daily as needed for anxiety.   Magnesium Oxide 500 MG Tabs Take 1 tablet by  mouth daily.   Melatonin 5 MG Caps Take 5 mg by mouth at bedtime.   metFORMIN 500 MG tablet Commonly known as: GLUCOPHAGE Take 1,000 mg by mouth 2 (two) times daily with a meal.   Multi-Vitamin tablet Take 1 tablet by mouth daily.   omeprazole 40 MG capsule Commonly known as: PRILOSEC Take 40 mg by mouth daily.   ondansetron 4 MG tablet Commonly known as: ZOFRAN Take 4 mg by mouth every 8 (eight) hours as needed for nausea or vomiting.   oxyCODONE-acetaminophen 10-325 MG tablet Commonly known as: PERCOCET Take 1 tablet by mouth every 6 (six) hours as needed for pain.   polyethylene glycol 17 g packet Commonly known as: MIRALAX / GLYCOLAX Take 17 g by mouth daily.       Follow-up Information    Gladstone Lighter, MD. Schedule an appointment as soon as possible for a visit in 1 week(s).   Specialty: Internal Medicine Contact information: [address] [place] [address] [phone]              Allergies  Allergen Reactions  . Codeine Other (See Comments)    Cough syrup with codeine caused hyperactivity    Consultations:  General surgery   Procedures/Studies: DG Abd 1 View  Result Date: 02/14/2020 CLINICAL DATA:  Recent bowel obstruction EXAM: ABDOMEN - 1 VIEW COMPARISON:  CT abdomen and pelvis February 13, 2020 FINDINGS: Contrast is seen throughout the colon. There is no bowel dilatation or air-fluid level to suggest bowel obstruction. No free air. Surgical clips noted in the right upper quadrant as well as postoperative changes in the mid lumbar region. Lung bases are clear. IMPRESSION: Contrast throughout colon. No findings indicating bowel obstruction currently. No free air. Lung bases clear. Electronically Signed   By: Lowella Grip III M.D.   On: 02/14/2020 08:25   CT ABDOMEN PELVIS W CONTRAST  Result Date: 02/13/2020 CLINICAL DATA:  Chest pain and abdominal pain. EXAM: CT ABDOMEN AND PELVIS WITH CONTRAST TECHNIQUE: Multidetector CT  imaging of the abdomen and pelvis was performed using the standard protocol following bolus administration of intravenous contrast. CONTRAST:  148m OMNIPAQUE IOHEXOL 300 MG/ML  SOLN COMPARISON:  None. FINDINGS: Lower chest: No acute abnormality. Hepatobiliary: No focal liver abnormality is seen. Status post cholecystectomy. No biliary dilatation. Pancreas: Unremarkable. No pancreatic ductal dilatation or surrounding inflammatory changes. Spleen: Normal in size without focal abnormality. Adrenals/Urinary Tract: Adrenal glands are unremarkable. Kidneys are normal in size, without focal lesions or hydronephrosis. A 6 mm nonobstructing renal stone is seen within the mid to upper left kidney, with a 7 mm nonobstructing renal stone noted within the lower pole of the left kidney. Bladder is unremarkable. Stomach/Bowel: The stomach is moderately distended. Appendix appears normal. Multiple dilated small bowel loops are seen throughout the abdomen and pelvis (maximum small bowel diameter of approximately 3.6 cm). A transition zone is seen within the anterior aspect of the mid left abdomen (axial CT images 41 through 56, CT series number 2). Vascular/Lymphatic: No significant vascular findings are present. No enlarged abdominal or pelvic lymph nodes. Reproductive: The uterus is normal in appearance. A 1.3 cm diameter cyst is seen along the right adnexa. Other: No abdominal wall hernia or abnormality. No abdominopelvic ascites. Musculoskeletal: No acute or significant osseous findings. IMPRESSION: 1. Findings consistent with a partial small bowel obstruction. 2. Nonobstructing renal calculi within the left kidney. 3. Evidence of prior cholecystectomy. 4. Small right adnexal cyst, likely ovarian in origin. Electronically Signed   By: TVirgina NorfolkM.D.   On: 02/13/2020 20:26    (Echo, Carotid, EGD, Colonoscopy, ERCP)    Subjective: Seen and examined the day of discharge.  Tolerating soft diet without issue.   Ambulating freely around the hallways.  Stable for discharge home.  Discharge Exam: Vitals:   02/15/20 0556 02/15/20 1151  BP: (!) 142/86 (!) 139/95  Pulse: 77 86  Resp: 20 15  Temp: 98.1 F (36.7 C) 98.3 F (36.8 C)  SpO2: 99% 97%   Vitals:   02/14/20 1134 02/14/20 1936 02/15/20 0556 02/15/20 1151  BP: 137/87 (!) 136/94 (!) 142/86 (!) 139/95  Pulse: 98 71 77 86  Resp:  20 20 15   Temp: 97.7 F (36.5 C) 98.6 F (37 C) 98.1 F (36.7 C) 98.3 F (36.8 C)  TempSrc:  Oral Oral Oral  SpO2: 98% 95% 99% 97%  Weight:      Height:        General: Pt is alert, awake, not in acute distress Cardiovascular: RRR, S1/S2 +, no rubs, no gallops Respiratory: CTA bilaterally, no wheezing, no rhonchi Abdominal: Soft, NT, ND, bowel sounds + Extremities: no edema, no cyanosis    The results of significant diagnostics from this hospitalization (including imaging, microbiology, ancillary and laboratory) are listed below for reference.  Microbiology: Recent Results (from the past 240 hour(s))  SARS Coronavirus 2 by RT PCR (hospital order, performed in [facility] hospital lab) Nasopharyngeal Nasopharyngeal Swab     Status: None   Collection Time: 02/13/20  9:41 PM   Specimen: Nasopharyngeal Swab  Result Value Ref Range Status   SARS Coronavirus 2 NEGATIVE NEGATIVE Final    Comment: (NOTE) SARS-CoV-2 target nucleic acids are NOT DETECTED.  The SARS-CoV-2 RNA is generally detectable in upper and lower respiratory specimens during the acute phase of infection. The lowest concentration of SARS-CoV-2 viral copies this assay can detect is 250 copies / mL. A negative result does not preclude SARS-CoV-2 infection and should not be used as the sole basis for treatment or other patient management decisions.  A negative result may occur with improper specimen collection / handling, submission of specimen other than nasopharyngeal swab, presence of viral mutation(s) within the areas targeted by  this assay, and inadequate number of viral copies (<250 copies / mL). A negative result must be combined with clinical observations, patient history, and epidemiological information.  Fact Sheet for Patients:   StrictlyIdeas.no  Fact Sheet for Healthcare Providers: BankingDealers.co.za  This test is not yet approved or  cleared by the [place] FDA and has been authorized for detection and/or diagnosis of SARS-CoV-2 by FDA under an Emergency Use Authorization (EUA).  This EUA will remain in effect (meaning this test can be used) for the duration of the COVID-19 declaration under Section 564(b)(1) of the Act, 21 U.S.C. section 360bbb-3(b)(1), unless the authorization is terminated or revoked sooner.  Performed at [facility], [address] [place], [place] [address]      Labs: BNP (last 3 results) No results for input(s): BNP in the last 8760 hours. Basic Metabolic Panel: Recent Labs  Lab 02/13/20 1617 02/14/20 0604 02/15/20 0718  NA 138 140 137  K 4.1 3.6 3.6  CL 101 105 101  CO2 28 29 27   GLUCOSE 95 93 100*  BUN 15 9 6   CREATININE 0.59 0.44 0.43*  CALCIUM 8.9 8.4* 8.5*   Liver Function Tests: Recent Labs  Lab 02/13/20 1617  AST 20  ALT 25  ALKPHOS 80  BILITOT 0.6  PROT 7.0  ALBUMIN 4.0   Recent Labs  Lab 02/13/20 1617  LIPASE 23   No results for input(s): AMMONIA in the last 168 hours. CBC: Recent Labs  Lab 02/13/20 1617 02/14/20 0604  WBC 10.4 7.7  HGB 11.8* 11.7*  HCT 35.4* 35.3*  MCV 91.0 91.0  PLT 360 313   Cardiac Enzymes: No results for input(s): CKTOTAL, CKMB, CKMBINDEX, TROPONINI in the last 168 hours. BNP: Invalid input(s): POCBNP CBG: No results for input(s): GLUCAP in the last 168 hours. D-Dimer No results for input(s): DDIMER in the last 72 hours. Hgb A1c No results for input(s): HGBA1C in the last 72 hours. Lipid Profile No results for input(s): CHOL, HDL,  LDLCALC, TRIG, CHOLHDL, LDLDIRECT in the last 72 hours. Thyroid function studies No results for input(s): TSH, T4TOTAL, T3FREE, THYROIDAB in the last 72 hours.  Invalid input(s): FREET3 Anemia work up No results for input(s): VITAMINB12, FOLATE, FERRITIN, TIBC, IRON, RETICCTPCT in the last 72 hours. Urinalysis    Component Value Date/Time   COLORURINE YELLOW (A) 02/13/2020 1617   APPEARANCEUR CLEAR (A) 02/13/2020 1617   LABSPEC 1.013 02/13/2020 1617   PHURINE 7.0 02/13/2020 1617   GLUCOSEU NEGATIVE 02/13/2020 1617   HGBUR NEGATIVE 02/13/2020 [place] 02/13/2020 1617  KETONESUR NEGATIVE 02/13/2020 1617   PROTEINUR NEGATIVE 02/13/2020 1617   NITRITE NEGATIVE 02/13/2020 1617   LEUKOCYTESUR NEGATIVE 02/13/2020 1617   Sepsis Labs Invalid input(s): PROCALCITONIN,  WBC,  LACTICIDVEN Microbiology Recent Results (from the past 240 hour(s))  SARS Coronavirus 2 by RT PCR (hospital order, performed in [facility] hospital lab) Nasopharyngeal Nasopharyngeal Swab     Status: None   Collection Time: 02/13/20  9:41 PM   Specimen: Nasopharyngeal Swab  Result Value Ref Range Status   SARS Coronavirus 2 NEGATIVE NEGATIVE Final    Comment: (NOTE) SARS-CoV-2 target nucleic acids are NOT DETECTED.  The SARS-CoV-2 RNA is generally detectable in upper and lower respiratory specimens during the acute phase of infection. The lowest concentration of SARS-CoV-2 viral copies this assay can detect is 250 copies / mL. A negative result does not preclude SARS-CoV-2 infection and should not be used as the sole basis for treatment or other patient management decisions.  A negative result may occur with improper specimen collection / handling, submission of specimen other than nasopharyngeal swab, presence of viral mutation(s) within the areas targeted by this assay, and inadequate number of viral copies (<250 copies / mL). A negative result must be combined with clinical observations,  patient history, and epidemiological information.  Fact Sheet for Patients:   StrictlyIdeas.no  Fact Sheet for Healthcare Providers: BankingDealers.co.za  This test is not yet approved or  cleared by the [place] FDA and has been authorized for detection and/or diagnosis of SARS-CoV-2 by FDA under an Emergency Use Authorization (EUA).  This EUA will remain in effect (meaning this test can be used) for the duration of the COVID-19 declaration under Section 564(b)(1) of the Act, 21 U.S.C. section 360bbb-3(b)(1), unless the authorization is terminated or revoked sooner.  Performed at [facility], [address] [place], [place] [address]      Time coordinating discharge: Over 30 minutes  SIGNED:   Sidney Ace, MD  [facility] 02/15/2020, 3:35 PM Pager   If 7PM-7AM, please contact night-coverage

## 2020-02-15 NOTE — Progress Notes (Addendum)
[facility] SURGICAL PROGRESS NOTE (cpt [phone])  Hospital Day(s): 1.   Interval History: Patient seen and examined, no acute events or new complaints overnight. Patient reports she is doing fine, no abdominal pain, she denies fever, chills, nausea, emesis. No new labs or imaging this morning. She has responded to bowel regimen. Tolerating CLD yesterday, diet advanced this morning. No issues with mobilization.   Review of Systems:  Constitutional: denies fever, chills  HEENT: denies cough or congestion  Respiratory: denies any shortness of breath  Cardiovascular: denies chest pain or palpitations  Gastrointestinal: denies abdominal pain, N/V, or diarrhea/and bowel function as per interval history Genitourinary: denies burning with urination or urinary frequency Musculoskeletal: denies pain, decreased motor or sensation  Vital signs in last 24 hours: [min-max] current  Temp:  [97.7 F (36.5 C)-98.6 F (37 C)] 98.1 F (36.7 C) (09/22 0556) Pulse Rate:  [71-98] 77 (09/22 0556) Resp:  [20] 20 (09/22 0556) BP: (136-142)/(86-94) 142/86 (09/22 0556) SpO2:  [95 %-99 %] 99 % (09/22 0556)     Height: 5' 6"  (167.6 cm) Weight: 86 kg BMI (Calculated): 30.62   Intake/Output last 2 shifts:  No intake/output data recorded.   Physical Exam:  Constitutional: alert, cooperative and no distress  HENT: normocephalic without obvious abnormality  Eyes: PERRL, EOM's grossly intact and symmetric  Respiratory: breathing non-labored at rest  Cardiovascular: regular rate and sinus rhythm  Gastrointestinal: Soft, non-tender, and non-distended, no rebound/guarding Musculoskeletal: no edema or wounds, motor and sensation grossly intact, NT    Labs:  CBC Latest Ref Rng & Units 02/14/2020 02/13/2020  WBC 4.0 - 10.5 K/uL 7.7 10.4  Hemoglobin 12.0 - 15.0 g/dL 11.7(L) 11.8(L)  Hematocrit 36 - 46 % 35.3(L) 35.4(L)  Platelets 150 - 400 K/uL 313 360   CMP Latest Ref Rng & Units 02/14/2020  02/13/2020  Glucose 70 - 99 mg/dL 93 95  BUN 6 - 20 mg/dL 9 15  Creatinine 0.44 - 1.00 mg/dL 0.44 0.59  Sodium 135 - 145 mmol/L 140 138  Potassium 3.5 - 5.1 mmol/L 3.6 4.1  Chloride 98 - 111 mmol/L 105 101  CO2 22 - 32 mmol/L 29 28  Calcium 8.9 - 10.3 mg/dL 8.4(L) 8.9  Total Protein 6.5 - 8.1 g/dL - 7.0  Total Bilirubin 0.3 - 1.2 mg/dL - 0.6  Alkaline Phos 38 - 126 U/L - 80  AST 15 - 41 U/L - 20  ALT 0 - 44 U/L - 25     Imaging studies: No new pertinent imaging studies   Assessment/Plan: (ICD-10's: K59.00) 48 y.o. female with constipation on CT, which I believe most likely represents some degree of adynamic ileus secondary to severe constipation and chronic narcotic use. Partial SBO is possible given surgical history however she has been passing flatus and now having bowel function.    - Okay to ADAT today  - She should continue DAILY bowel regimen at home with colace + miralax given chronic narcotic pain medication use   - Monitor abdominal examination; on-going bowel function             - Pain control prn (limit narcotics as feasible); antiemetics prn             - No surgical intervention             - Mobilization encouraged             - Further management per primary service       - Discharge Planning:  No surgical issues, we will sign off. Advance diet as tolerates, discharge once medically cleared. She DOES NOT need any general surgery follow up, bowel regimen for home outlined above  All of the above findings and recommendations were discussed with the patient, and the medical team, and all of patient's questions were answered to her expressed satisfaction.  -- Edison Simon, PA-C [facility] 02/15/2020, 7:25 AM [phone] M-F: 7am - 4pm  I saw and evaluated the patient.  I agree with the above documentation, exam, and plan, which I have edited where appropriate. Fredirick Maudlin  11:04 AM

## 2020-02-15 NOTE — Progress Notes (Signed)
Breanna Ramos to be D/C'd home per MD order.  Discussed prescriptions and follow up appointments with the patient. Prescriptions given to patient, medication list explained in detail. Pt verbalized understanding.  Allergies as of 02/15/2020       Reactions   Codeine Other (See Comments)   Cough syrup with codeine caused hyperactivity        Medication List     TAKE these medications    ascorbic acid 500 MG tablet Commonly known as: VITAMIN C Take 500 mg by mouth daily.   bisoprolol 5 MG tablet Commonly known as: ZEBETA Take 2.5 mg by mouth daily.   buPROPion 300 MG 24 hr tablet Commonly known as: WELLBUTRIN XL Take 300 mg by mouth daily.   cabergoline 0.5 MG tablet Commonly known as: DOSTINEX Take 0.5 mg by mouth 2 (two) times a week. Tuesday/Saturday   celecoxib 200 MG capsule Commonly known as: CELEBREX Take 200 mg by mouth at bedtime.   Cholecalciferol 50 MCG (2000 UT) Caps Take 2,000 Units by mouth daily.   cyanocobalamin 1000 MCG tablet Take 1,000 mcg by mouth daily.   cyclobenzaprine 10 MG tablet Commonly known as: FLEXERIL Take 10 mg by mouth 2 (two) times daily.   diltiazem 240 MG 24 hr capsule Commonly known as: TIAZAC Take 240 mg by mouth daily.   docusate sodium 100 MG capsule Commonly known as: COLACE Take 1 capsule (100 mg total) by mouth daily. Start taking on: February 16, 2020   FLUoxetine 40 MG capsule Commonly known as: PROZAC Take 40 mg by mouth daily.   gabapentin 300 MG capsule Commonly known as: NEURONTIN Take 300 mg by mouth daily.   loratadine 10 MG tablet Commonly known as: CLARITIN Take 10 mg by mouth daily.   LORazepam 1 MG tablet Commonly known as: ATIVAN Take 1 mg by mouth daily as needed for anxiety.   Magnesium Oxide 500 MG Tabs Take 1 tablet by mouth daily.   Melatonin 5 MG Caps Take 5 mg by mouth at bedtime.   metFORMIN 500 MG tablet Commonly known as: GLUCOPHAGE Take 1,000 mg by mouth 2 (two) times daily  with a meal.   Multi-Vitamin tablet Take 1 tablet by mouth daily.   omeprazole 40 MG capsule Commonly known as: PRILOSEC Take 40 mg by mouth daily.   ondansetron 4 MG tablet Commonly known as: ZOFRAN Take 4 mg by mouth every 8 (eight) hours as needed for nausea or vomiting.   oxyCODONE-acetaminophen 10-325 MG tablet Commonly known as: PERCOCET Take 1 tablet by mouth every 6 (six) hours as needed for pain.   polyethylene glycol 17 g packet Commonly known as: MIRALAX / GLYCOLAX Take 17 g by mouth daily.        Vitals:   02/15/20 0556 02/15/20 1151  BP: (!) 142/86 (!) 139/95  Pulse: 77 86  Resp: 20 15  Temp: 98.1 F (36.7 C) 98.3 F (36.8 C)  SpO2: 99% 97%    Skin clean, dry and intact without evidence of skin break down, no evidence of skin tears noted. IV catheter discontinued intact. Site without signs and symptoms of complications. Dressing and pressure applied. Pt denies pain at this time. No complaints noted.  An After Visit Summary was printed and given to the patient. Patient escorted via [place], and D/C home via private auto.  [name] A [name]

## 2020-02-15 NOTE — Discharge Instructions (Signed)
Bowel Obstruction A bowel obstruction is a blockage in the small or large bowel. The bowel, which is also called the intestine, is a long, slender tube that connects the stomach to the anus. When a person eats and drinks, food and fluids go from the mouth to the stomach to the small bowel. This is where most of the nutrients in the food and fluids are absorbed. After the small bowel, material passes through the large bowel for further absorption until any leftover material leaves the body as stool through the anus during a bowel movement. A bowel obstruction will prevent food and fluids from passing through the bowel as they normally do during digestion. The bowel can become partially or completely blocked. If this condition is not treated, it can be dangerous because the bowel could rupture. What are the causes? Common causes of this condition include:  Scar tissue (adhesions) from previous surgery or treatment with high-energy X-rays (radiation).  Recent surgery. This may cause the movements of the bowel to slow down and cause food to block the intestine.  Inflammatory bowel disease, such as Crohn's disease or diverticulitis.  Growths or tumors.  A bulging organ (hernia).  Twisting of the bowel (volvulus).  A foreign body.  Slipping of a part of the bowel into another part (intussusception). What are the signs or symptoms? Symptoms of this condition include:  Pain in the abdomen. Depending on the degree of obstruction, pain may be: ? Mild or severe. ? Dull cramping or sharp pain. ? In one area or in the entire abdomen.  Nausea and vomiting. Vomit may be greenish or a yellow bile color.  Bloating in the abdomen.  Difficulty passing stool (constipation).  Lack of passing gas.  Frequent belching.  Diarrhea. This may occur if the obstruction is partial and runny stool is able to leak around the obstruction. How is this diagnosed? This condition may be diagnosed based on:  A  physical exam.  Medical history.  Imaging tests of the abdomen or pelvis, such as X-ray or CT scan.  Blood or urine tests. How is this treated? Treatment for this condition depends on the cause and severity of the problem. Treatment may include:  Fluids and pain medicines that are given through an IV. Your health care provider may instruct you not to eat or drink if you have nausea or vomiting.  Eating a simple diet. You may be asked to consume a clear liquid diet for several days. This allows the bowel to rest.  Placement of a small tube (nasogastric tube) into the stomach. This will relieve pain, discomfort, and nausea by removing blocked air and fluids from the stomach. It can also help the obstruction clear up faster.  Surgery. This may be required if other treatments do not work. Surgery may be required for: ? Bowel obstruction from a hernia. This can be an emergency procedure. ? Scar tissue that causes frequent or severe obstructions. Follow these instructions at home: Medicines  Take over-the-counter and prescription medicines only as told by your health care provider.  If you were prescribed an antibiotic medicine, take it as told by your health care provider. Do not stop taking the antibiotic even if you start to feel better. General instructions  Follow instructions from your health care provider about eating restrictions. You may need to avoid solid foods and consume only clear liquids until your condition improves.  Return to your normal activities as told by your health care provider. Ask your health care  provider what activities are safe for you.  Avoid sitting for a long time without moving. Get up to take short walks every 1-2 hours. This is important to improve blood flow and breathing. Ask for help if you feel weak or unsteady.  Keep all follow-up visits as told by your health care provider. This is important. How is this prevented? After having a bowel  obstruction, you are more likely to have another. You may do the following things to prevent another obstruction:  If you have a long-term (chronic) disease, pay attention to your symptoms and contact your health care provider if you have questions or concerns.  Avoid becoming constipated. To prevent or treat constipation, your health care provider may recommend that you: ? Drink enough fluid to keep your urine pale yellow. ? Take over-the-counter or prescription medicines. ? Eat foods that are high in fiber, such as beans, whole grains, and fresh fruits and vegetables. ? Limit foods that are high in fat and processed sugars, such as fried or sweet foods.  Stay active. Exercise for 30 minutes or more, 5 or more days each week. Ask your health care provider which exercises are safe for you.  Avoid stress. Find ways to reduce stress, such as meditation, exercise, or taking time for activities that relax you.  Instead of eating three large meals each day, eat three small meals with three small snacks.  Work with a Microbiologist to make a healthy meal plan that works for you.  Do not use any products that contain nicotine or tobacco, such as cigarettes and e-cigarettes. If you need help quitting, ask your health care provider. Contact a health care provider if you:  Have a fever.  Have chills. Get help right away if you:  Have increased pain or cramping.  Vomit blood.  Have uncontrolled vomiting or nausea.  Cannot drink fluids because of vomiting or pain.  Become confused.  Begin feeling very thirsty (dehydrated).  Have severe bloating.  Feel extremely weak or you faint. Summary  A bowel obstruction is a blockage in the small or large bowel.  A bowel obstruction will prevent food and fluids from passing through the bowel as they normally do during digestion.  Treatment for this condition depends on the cause and severity of the problem. It may include fluids and pain medicines  through an IV, a simple diet, a nasogastric tube, or surgery.  Follow instructions from your health care provider about eating restrictions. You may need to avoid solid foods and consume only clear liquids until your condition improves. This information is not intended to replace advice given to you by your health care provider. Make sure you discuss any questions you have with your health care provider. Document Revised: 06/18/2018 Document Reviewed: 09/23/2017 Elsevier Patient Education  [place].

## 2020-02-17 ENCOUNTER — Ambulatory Visit: Payer: BLUE CROSS/BLUE SHIELD | Admitting: Gastroenterology

## 2020-02-17 ENCOUNTER — Other Ambulatory Visit: Payer: Self-pay

## 2020-02-17 ENCOUNTER — Encounter: Payer: Self-pay | Admitting: Gastroenterology

## 2020-02-17 VITALS — BP 136/92 | HR 87 | Temp 97.6°F | Ht 66.5 in | Wt 183.4 lb

## 2020-02-17 DIAGNOSIS — F32A Depression, unspecified: Secondary | ICD-10-CM | POA: Insufficient documentation

## 2020-02-17 DIAGNOSIS — M7918 Myalgia, other site: Secondary | ICD-10-CM | POA: Insufficient documentation

## 2020-02-17 DIAGNOSIS — M545 Low back pain, unspecified: Secondary | ICD-10-CM | POA: Insufficient documentation

## 2020-02-17 DIAGNOSIS — Z79899 Other long term (current) drug therapy: Secondary | ICD-10-CM | POA: Insufficient documentation

## 2020-02-17 DIAGNOSIS — M961 Postlaminectomy syndrome, not elsewhere classified: Secondary | ICD-10-CM | POA: Insufficient documentation

## 2020-02-17 DIAGNOSIS — D497 Neoplasm of unspecified behavior of endocrine glands and other parts of nervous system: Secondary | ICD-10-CM | POA: Insufficient documentation

## 2020-02-17 DIAGNOSIS — M6250 Muscle wasting and atrophy, not elsewhere classified, unspecified site: Secondary | ICD-10-CM | POA: Insufficient documentation

## 2020-02-17 DIAGNOSIS — K625 Hemorrhage of anus and rectum: Secondary | ICD-10-CM

## 2020-02-17 DIAGNOSIS — F419 Anxiety disorder, unspecified: Secondary | ICD-10-CM | POA: Insufficient documentation

## 2020-02-17 DIAGNOSIS — D649 Anemia, unspecified: Secondary | ICD-10-CM | POA: Diagnosis not present

## 2020-02-17 DIAGNOSIS — M542 Cervicalgia: Secondary | ICD-10-CM | POA: Insufficient documentation

## 2020-02-17 DIAGNOSIS — M461 Sacroiliitis, not elsewhere classified: Secondary | ICD-10-CM | POA: Insufficient documentation

## 2020-02-17 MED ORDER — CLENPIQ 10-3.5-12 MG-GM -GM/160ML PO SOLN: 320.0000 mL | Freq: Every day | ORAL | 0 refills | Status: DC

## 2020-02-17 NOTE — Progress Notes (Signed)
Cephas Darby, MD [address]  [place]  [place], [place] [address]  Main: [phone]  Fax: [phone]    Gastroenterology Consultation  Referring Provider:     Gladstone Lighter, MD Primary Care Physician:  Gladstone Lighter, MD Primary Gastroenterologist:  Dr. Cephas Darby Reason for Consultation:     Rectal bleeding        HPI:   Breanna Ramos is a 49 y.o. female referred by Dr. Gladstone Lighter, MD  for consultation & management of rectal bleeding. Patient reports several months history of painless rectal bleeding on wiping as well as in the toilet bowl, mixed with stool.  She has history of chronic opioid use, chronic constipation, her bowel frequency anywhere from every 3 to 4 days to once a week.  She takes MiraLAX as needed.  She also reports weight loss of about 15 pounds within last few months.  She has history of chronic tobacco use.  She was just discharged from [facility] after she was managed for partial small bowel obstruction which was thought to be in the setting of severe constipation.  She does have history of mild anemia.  She also reports menorrhagia, she has history of prolactinoma, underwent resection.  Patient denies abdominal pain, bloating, nausea or vomiting.  NSAIDs: None  Antiplts/Anticoagulants/Anti thrombotics: None  GI Procedures: Reports undergoing colonoscopy about 5 to 7 years ago for rectal bleeding, reportedly normal She denies family history of GI malignancy  Past Medical History:  Diagnosis Date  . Arthritis   . Diabetes mellitus without complication ([place])   . Fibromyalgia   . H/O cardiac radiofrequency ablation   . Hypertension   . 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 CHOLECYSTECTOMY      Current Outpatient Medications:  .  ascorbic acid (VITAMIN C) 500 MG tablet, Take 500 mg by mouth daily. , Disp: , Rfl:  .  bisoprolol (ZEBETA) 5 MG tablet, Take 2.5 mg by mouth daily. ,  Disp: , Rfl:  .  buPROPion (WELLBUTRIN XL) 300 MG 24 hr tablet, Take 300 mg by mouth daily. , Disp: , Rfl:  .  cabergoline (DOSTINEX) 0.5 MG tablet, Take 0.5 mg by mouth 2 (two) times a week. Tuesday/Saturday, Disp: , Rfl:  .  celecoxib (CELEBREX) 200 MG capsule, Take 200 mg by mouth at bedtime. , Disp: , Rfl:  .  Cholecalciferol 50 MCG (2000 UT) CAPS, Take 2,000 Units by mouth daily. , Disp: , Rfl:  .  cyanocobalamin 1000 MCG tablet, Take 1,000 mcg by mouth daily. , Disp: , Rfl:  .  cyclobenzaprine (FLEXERIL) 10 MG tablet, Take 10 mg by mouth 2 (two) times daily. , Disp: , Rfl:  .  diltiazem (TIAZAC) 240 MG 24 hr capsule, Take 240 mg by mouth daily. , Disp: , Rfl:  .  docusate sodium (COLACE) 100 MG capsule, Take 1 capsule (100 mg total) by mouth daily., Disp: 30 capsule, Rfl: 0 .  FLUoxetine (PROZAC) 40 MG capsule, Take 40 mg by mouth daily., Disp: , Rfl:  .  gabapentin (NEURONTIN) 300 MG capsule, Take 300 mg by mouth daily. , Disp: , Rfl:  .  loratadine (CLARITIN) 10 MG tablet, Take 10 mg by mouth daily. , Disp: , Rfl:  .  LORazepam (ATIVAN) 1 MG tablet, Take 1 mg by mouth daily as needed for anxiety. , Disp: , Rfl:  .  Magnesium Oxide 500 MG TABS, Take 1 tablet by mouth daily.,  Disp: , Rfl:  .  Melatonin 5 MG CAPS, Take 5 mg by mouth at bedtime. , Disp: , Rfl:  .  metFORMIN (GLUCOPHAGE) 500 MG tablet, Take 1,000 mg by mouth 2 (two) times daily with a meal., Disp: , Rfl:  .  Multiple Vitamin (MULTI-VITAMIN) tablet, Take 1 tablet by mouth daily., Disp: , Rfl:  .  omeprazole (PRILOSEC) 40 MG capsule, Take 40 mg by mouth daily. , Disp: , Rfl:  .  ondansetron (ZOFRAN) 4 MG tablet, Take 4 mg by mouth every 8 (eight) hours as needed for nausea or vomiting., Disp: , Rfl:  .  oxyCODONE-acetaminophen (PERCOCET) 10-325 MG tablet, Take 1 tablet by mouth every 6 (six) hours as needed for pain. , Disp: , Rfl:  .  polyethylene glycol (MIRALAX / GLYCOLAX) 17 g packet, Take 17 g by mouth daily., Disp: 30  packet, Rfl: 0 .  Sod Picosulfate-Mag Ox-Cit Acd (CLENPIQ) 10-3.5-12 MG-GM -GM/160ML SOLN, Take 320 mLs by mouth daily., Disp: 320 mL, Rfl: 0   Family History  Problem Relation Age of Onset  . Stroke Mother   . Diabetes Mother   . Heart disease Mother   . Breast cancer Mother   . Stroke Father      Social History   Tobacco Use  . Smoking status: Current Every Day Smoker    Packs/day: 0.25  . Smokeless tobacco: Never Used  Substance Use Topics  . Alcohol use: Yes    Comment: ~1 beer/week  . Drug use: Never    Allergies as of 02/17/2020 - Review Complete 02/17/2020  Allergen Reaction Noted  . Codeine Other (See Comments) 03/07/2019    Review of Systems:    All systems reviewed and negative except where noted in HPI.   Physical Exam:  BP (!) 136/92 (BP Location: Left Arm, Patient Position: Sitting, Cuff Size: Normal)   Pulse 87   Temp 97.6 F (36.4 C) (Oral)   Ht 5' 6.5" (1.689 m)   Wt 183 lb 6 oz (83.2 kg)   BMI 29.15 kg/m  No LMP recorded.  General:   Alert,  Well-developed, well-nourished, pleasant and cooperative in NAD Head:  Normocephalic and atraumatic. Eyes:  Sclera clear, no icterus.   Conjunctiva pink. Ears:  Normal auditory acuity. Nose:  No deformity, discharge, or lesions. Mouth:  No deformity or lesions,oropharynx pink & moist. Neck:  Supple; no masses or thyromegaly. Lungs:  Respirations even and unlabored.  Clear throughout to auscultation.   No wheezes, crackles, or rhonchi. No acute distress. Heart:  Regular rate and rhythm; no murmurs, clicks, rubs, or gallops. Abdomen:  Normal bowel sounds. Soft, non-tender and non-distended without masses, hepatosplenomegaly or hernias noted.  No guarding or rebound tenderness.   Rectal: Nontender digital rectal exam, normal perianal exam, anoscopy revealed inflammation in the distal rectum, friable mucosa with contact bleeding Msk:  Symmetrical without gross deformities. Good, equal movement & strength  bilaterally. Pulses:  Normal pulses noted. Extremities:  No clubbing or edema.  No cyanosis. Neurologic:  Alert and oriented x3;  grossly normal neurologically. Skin:  Intact without significant lesions or rashes. No jaundice. Psych:  Alert and cooperative. Normal mood and affect.  Imaging Studies: Reviewed  Assessment and Plan:   Diamone Whistler is a 49 y.o. Caucasian female with history of anxiety, depression, diabetes, fibromyalgia, history of prolactinoma, radiofrequency ablation for SVT, chronic opioid use, opioid-induced constipation is seen in consultation for painless rectal bleeding  Painless rectal bleeding Anoscopy concerning for inflammation in the rectum.  Therefore, hemorrhoid ligation was deferred today Recommend colonoscopy with TI evaluation given history of recent partial SBO Check CBC, iron panel, B12 and folate levels  Chronic constipation, opioid induced Trial of Linzess 145 MCG daily, samples provided Highly encouraged patient to avoid diet soda, she does drink 1 L of diet Pepsi daily for several years Discussed about adequate intake of water.   Follow up in 4 to 6 weeks   Cephas Darby, MD

## 2020-02-18 LAB — CBC
Hematocrit: 41.6 % (ref 34.0–46.6)
Hemoglobin: 14.1 g/dL (ref 11.1–15.9)
MCH: 29.8 pg (ref 26.6–33.0)
MCHC: 33.9 g/dL (ref 31.5–35.7)
MCV: 88 fL (ref 79–97)
Platelets: 406 10*3/uL (ref 150–450)
RBC: 4.73 x10E6/uL (ref 3.77–5.28)
RDW: 12.9 % (ref 11.7–15.4)
WBC: 9.7 10*3/uL (ref 3.4–10.8)

## 2020-02-18 LAB — IRON,TIBC AND FERRITIN PANEL
Ferritin: 16 ng/mL (ref 15–150)
Iron Saturation: 9 % — CL (ref 15–55)
Iron: 35 ug/dL (ref 27–159)
Total Iron Binding Capacity: 375 ug/dL (ref 250–450)
UIBC: 340 ug/dL (ref 131–425)

## 2020-02-18 LAB — B12 AND FOLATE PANEL
Folate: >20 ng/mL (ref >3.0)
Vitamin B-12: >2000 pg/mL — ABNORMAL HIGH (ref 232–1245)

## 2020-02-20 NOTE — Telephone Encounter (Signed)
Moved patient to 03/08/2020

## 2020-02-23 ENCOUNTER — Other Ambulatory Visit: Payer: Self-pay

## 2020-02-23 ENCOUNTER — Ambulatory Visit
Admission: RE | Admit: 2020-02-23 | Discharge: 2020-02-23 | Disposition: A | Discharge status: Home / Self Care | Payer: BLUE CROSS/BLUE SHIELD | Source: Ambulatory Visit | Attending: Internal Medicine | Admitting: Internal Medicine

## 2020-02-23 DIAGNOSIS — Z1231 Encounter for screening mammogram for malignant neoplasm of breast: Secondary | ICD-10-CM | POA: Diagnosis present

## 2020-02-27 ENCOUNTER — Telehealth: Payer: Self-pay

## 2020-02-27 NOTE — Telephone Encounter (Signed)
Breanna Ramos with Pre service center is calling about patient colonoscopy that is scheduled for 03/07/2020. They are calling to find out if it needs pre authorization. She states she has been hold with [employer] 2 times for over 30 minutes and not been able to talk to anyone.  Phone number [phone] EXT [phone]

## 2020-02-27 NOTE — Telephone Encounter (Signed)
Left vm for Breanna Ramos at pre-service that per referral notes, prior authorization is not required for [id number]. Colonoscopy.

## 2020-03-05 ENCOUNTER — Other Ambulatory Visit
Admission: RE | Admit: 2020-03-05 | Discharge: 2020-03-05 | Disposition: A | Discharge status: Home / Self Care | Payer: BLUE CROSS/BLUE SHIELD | Source: Ambulatory Visit | Attending: Gastroenterology | Admitting: Gastroenterology

## 2020-03-05 ENCOUNTER — Other Ambulatory Visit: Payer: Self-pay

## 2020-03-05 DIAGNOSIS — Z01812 Encounter for preprocedural laboratory examination: Secondary | ICD-10-CM | POA: Diagnosis not present

## 2020-03-05 DIAGNOSIS — Z20822 Contact with and (suspected) exposure to covid-19: Secondary | ICD-10-CM | POA: Diagnosis not present

## 2020-03-05 LAB — SARS CORONAVIRUS 2 (TAT 6-24 HRS): SARS Coronavirus 2: NEGATIVE

## 2020-03-07 ENCOUNTER — Ambulatory Visit: Payer: BLUE CROSS/BLUE SHIELD | Admitting: Certified Registered Nurse Anesthetist

## 2020-03-07 ENCOUNTER — Encounter
Admission: RE | Disposition: A | Discharge status: Home / Self Care | Payer: Self-pay | Source: Home / Self Care | Attending: Gastroenterology

## 2020-03-07 ENCOUNTER — Ambulatory Visit
Admission: RE | Admit: 2020-03-07 | Discharge: 2020-03-07 | Disposition: A | Discharge status: Home / Self Care | Payer: BLUE CROSS/BLUE SHIELD | Attending: Gastroenterology | Admitting: Gastroenterology

## 2020-03-07 ENCOUNTER — Encounter: Payer: Self-pay | Admitting: Gastroenterology

## 2020-03-07 DIAGNOSIS — Z8719 Personal history of other diseases of the digestive system: Secondary | ICD-10-CM | POA: Insufficient documentation

## 2020-03-07 DIAGNOSIS — Z79899 Other long term (current) drug therapy: Secondary | ICD-10-CM | POA: Insufficient documentation

## 2020-03-07 DIAGNOSIS — K625 Hemorrhage of anus and rectum: Secondary | ICD-10-CM | POA: Diagnosis present

## 2020-03-07 DIAGNOSIS — K6289 Other specified diseases of anus and rectum: Secondary | ICD-10-CM | POA: Diagnosis not present

## 2020-03-07 DIAGNOSIS — F17210 Nicotine dependence, cigarettes, uncomplicated: Secondary | ICD-10-CM | POA: Diagnosis not present

## 2020-03-07 DIAGNOSIS — Z7984 Long term (current) use of oral hypoglycemic drugs: Secondary | ICD-10-CM | POA: Diagnosis not present

## 2020-03-07 DIAGNOSIS — E119 Type 2 diabetes mellitus without complications: Secondary | ICD-10-CM | POA: Diagnosis not present

## 2020-03-07 DIAGNOSIS — Z791 Long term (current) use of non-steroidal anti-inflammatories (NSAID): Secondary | ICD-10-CM | POA: Insufficient documentation

## 2020-03-07 DIAGNOSIS — I471 Supraventricular tachycardia: Secondary | ICD-10-CM | POA: Diagnosis not present

## 2020-03-07 DIAGNOSIS — K219 Gastro-esophageal reflux disease without esophagitis: Secondary | ICD-10-CM | POA: Insufficient documentation

## 2020-03-07 DIAGNOSIS — I10 Essential (primary) hypertension: Secondary | ICD-10-CM | POA: Insufficient documentation

## 2020-03-07 DIAGNOSIS — F419 Anxiety disorder, unspecified: Secondary | ICD-10-CM | POA: Insufficient documentation

## 2020-03-07 DIAGNOSIS — F32A Depression, unspecified: Secondary | ICD-10-CM | POA: Insufficient documentation

## 2020-03-07 DIAGNOSIS — M797 Fibromyalgia: Secondary | ICD-10-CM | POA: Insufficient documentation

## 2020-03-07 HISTORY — PX: COLONOSCOPY WITH PROPOFOL: SHX5780

## 2020-03-07 LAB — POCT PREGNANCY, URINE: Preg Test, Ur: NEGATIVE

## 2020-03-07 LAB — GLUCOSE, CAPILLARY: Glucose-Capillary: 112 mg/dL — ABNORMAL HIGH (ref 70–99)

## 2020-03-07 SURGERY — COLONOSCOPY WITH PROPOFOL
Anesthesia: General

## 2020-03-07 MED ORDER — LIDOCAINE HCL (CARDIAC) PF 100 MG/5ML IV SOSY
PREFILLED_SYRINGE | INTRAVENOUS | Status: DC | PRN
  Administered 2020-03-07: 50 mg via INTRAVENOUS

## 2020-03-07 MED ORDER — PROPOFOL 500 MG/50ML IV EMUL
INTRAVENOUS | Status: AC
  Filled 2020-03-07: qty 50

## 2020-03-07 MED ORDER — LIDOCAINE HCL (PF) 2 % IJ SOLN
INTRAMUSCULAR | Status: AC
  Filled 2020-03-07: qty 5

## 2020-03-07 MED ORDER — PROPOFOL 500 MG/50ML IV EMUL
INTRAVENOUS | Status: DC | PRN
  Administered 2020-03-07: 140 ug/kg/min via INTRAVENOUS

## 2020-03-07 MED ORDER — LINACLOTIDE 145 MCG PO CAPS: 145.0000 ug | ORAL_CAPSULE | Freq: Every day | ORAL | 2 refills | Status: DC

## 2020-03-07 MED ORDER — SODIUM CHLORIDE 0.9 % IV SOLN: INTRAVENOUS | Status: DC

## 2020-03-07 MED ORDER — PROPOFOL 10 MG/ML IV BOLUS
INTRAVENOUS | Status: DC | PRN
  Administered 2020-03-07 (×2): 16 mg via INTRAVENOUS
  Administered 2020-03-07: 80 mg via INTRAVENOUS
  Administered 2020-03-07 (×2): 16 mg via INTRAVENOUS

## 2020-03-07 NOTE — Progress Notes (Signed)
   03/07/20 0750  Clinical Encounter Type  Visited With Family  Visit Type Initial  Referral From Chaplain  Consult/Referral To Chaplain  while rounding SDS waiting area, chaplain spoke with Pt's fiance to find out how he was doing. He said he was fine and did not have any questions or concerns.

## 2020-03-07 NOTE — Op Note (Addendum)
[facility] Gastroenterology Patient Name: Breanna Ramos Procedure Date: 03/07/2020 7:11 AM MRN: [id number] Account #: [id number] Date of Birth: Dec 02, 1970 Admit Type: Outpatient Age: 49 Room: [facility] ENDO ROOM 4 Gender: Female Note Status: Finalized Procedure:             Colonoscopy Indications:           Rectal bleeding, , h/o partial SBO Providers:             Lin Landsman MD, MD Referring MD:          Gladstone Lighter, MD (Referring MD) Complications:         No immediate complications. Estimated blood loss:                         Minimal. Procedure:             Pre-Anesthesia Assessment:                        - Prior to the procedure, a History and Physical was                         performed, and patient medications and allergies were                         reviewed. The patient is competent. The risks and                         benefits of the procedure and the sedation options and                         risks were discussed with the patient. All questions                         were answered and informed consent was obtained.                         Patient identification and proposed procedure were                         verified by the physician, the nurse, the                         anesthesiologist, the anesthetist and the technician                         in the pre-procedure area in the procedure room in the                         endoscopy suite. Mental Status Examination: alert and                         oriented. Airway Examination: normal oropharyngeal                         airway and neck mobility. Respiratory Examination:                         clear to auscultation. CV Examination: normal.  Prophylactic Antibiotics: The patient does not require                         prophylactic antibiotics. Prior Anticoagulants: The                         patient has taken no previous anticoagulant or                          antiplatelet agents. ASA Grade Assessment: III - A                         patient with severe systemic disease. After reviewing                         the risks and benefits, the patient was deemed in                         satisfactory condition to undergo the procedure. The                         anesthesia plan was to use general anesthesia.                         Immediately prior to administration of medications,                         the patient was re-assessed for adequacy to receive                         sedatives. The heart rate, respiratory rate, oxygen                         saturations, blood pressure, adequacy of pulmonary                         ventilation, and response to care were monitored                         throughout the procedure. The physical status of the                         patient was re-assessed after the procedure.                        After obtaining informed consent, the colonoscope was                         passed under direct vision. Throughout the procedure,                         the patient's blood pressure, pulse, and oxygen                         saturations were monitored continuously. The                         Colonoscope was introduced through the anus and  advanced to the the terminal ileum, with                         identification of the appendiceal orifice and IC                         valve. The colonoscopy was performed without                         difficulty. The patient tolerated the procedure well.                         The quality of the bowel preparation was adequate. Findings:      The perianal and digital rectal examinations were normal. Pertinent       negatives include normal sphincter tone and no palpable rectal lesions.      The terminal ileum appeared normal.      Diffuse moderate inflammation characterized by altered vascularity,       congestion (edema),  erythema, friability and mucus was found in the       distal rectum. Biopsies were taken with a cold forceps for histology.       Estimated blood loss was minimal.      The colon (entire examined portion) appeared normal. Impression:            - The examined portion of the ileum was normal.                        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 Biopsied.                        - The entire examined colon is normal. Recommendation:        - Discharge patient to home (with escort).                        - Resume previous diet today.                        - Continue present medications.                        - Await pathology results.                        - Return to my office as previously scheduled. Procedure Code(s):     --- Professional ---                        [phone], Colonoscopy, flexible; with biopsy, single or                         multiple Diagnosis Code(s):     --- Professional ---                        K62.89, Other specified diseases of anus and rectum  K62.5, Hemorrhage of anus and rectum CPT copyright 2019 American Medical Association. All rights reserved. The codes documented in this report are preliminary and upon coder review may  be revised to meet current compliance requirements. Dr. Ulyess Mort Lin Landsman MD, MD 03/07/2020 8:45:12 AM This report has been signed electronically. Number of Addenda: 0 Note Initiated On: 03/07/2020 7:11 AM Scope Withdrawal Time: 0 hours 13 minutes 3 seconds  Total Procedure Duration: 0 hours 17 minutes 30 seconds  Estimated Blood Loss:  Estimated blood loss was minimal.      [facility]

## 2020-03-07 NOTE — Anesthesia Preprocedure Evaluation (Signed)
Anesthesia Evaluation  Patient identified by MRN, date of birth, ID band Patient awake    Reviewed: Allergy & Precautions, NPO status , Patient's Chart, lab work & pertinent test results  History of Anesthesia Complications Negative for: history of anesthetic complications  Airway Mallampati: II       Dental   Pulmonary neg sleep apnea, neg COPD, Current Smoker and Patient abstained from smoking.,           Cardiovascular hypertension, Pt. on medications and Pt. on home beta blockers (-) Past MI and (-) CHF + dysrhythmias (s/p ablation) Supra Ventricular Tachycardia (-) Valvular Problems/Murmurs     Neuro/Psych neg Seizures Anxiety Depression    GI/Hepatic Neg liver ROS, GERD  Medicated and Controlled,  Endo/Other  diabetes, Type 2, Oral Hypoglycemic Agents  Renal/GU negative Renal ROS     Musculoskeletal   Abdominal   Peds  Hematology   Anesthesia Other Findings   Reproductive/Obstetrics                             Anesthesia Physical Anesthesia Plan  ASA: III  Anesthesia Plan: General   Post-op Pain Management:    Induction: Intravenous  PONV Risk Score and Plan: 2 and Propofol infusion and TIVA  Airway Management Planned: Nasal Cannula  Additional Equipment:   Intra-op Plan:   Post-operative Plan:   Informed Consent: I have reviewed the patients History and Physical, chart, labs and discussed the procedure including the risks, benefits and alternatives for the proposed anesthesia with the patient or authorized representative who has indicated his/her understanding and acceptance.       Plan Discussed with:   Anesthesia Plan Comments:         Anesthesia Quick Evaluation

## 2020-03-07 NOTE — Transfer of Care (Signed)
Immediate Anesthesia Transfer of Care Note  Patient: Shelvia Fojtik  Procedure(s) Performed: COLONOSCOPY WITH PROPOFOL (N/A )  Patient Location: PACU and Endoscopy Unit  Anesthesia Type:General  Level of Consciousness: awake, alert , oriented and patient cooperative  Airway & Oxygen Therapy: Patient Spontanous Breathing  Post-op Assessment: Report given to RN and Post -op Vital signs reviewed and stable  Post vital signs: Reviewed and stable  Last Vitals:  Vitals Value Taken Time  BP 122/87 03/07/20 0846  Temp    Pulse 73 03/07/20 0847  Resp 16 03/07/20 0847  SpO2 100 % 03/07/20 0847  Vitals shown include unvalidated device data.  Last Pain:  Vitals:   03/07/20 0731  TempSrc: Temporal  PainSc: 5          Complications: No complications documented.

## 2020-03-07 NOTE — Anesthesia Postprocedure Evaluation (Signed)
Anesthesia Post Note  Patient: Breanna Ramos  Procedure(s) Performed: COLONOSCOPY WITH PROPOFOL (N/A )  Patient location during evaluation: Endoscopy Anesthesia Type: General Level of consciousness: awake and alert Pain management: pain level controlled Vital Signs Assessment: post-procedure vital signs reviewed and stable Respiratory status: spontaneous breathing and respiratory function stable Cardiovascular status: stable Anesthetic complications: no   No complications documented.   Last Vitals:  Vitals:   03/07/20 0846 03/07/20 0856  BP: 122/87 (!) 135/94  Pulse: 72 71  Resp: 12 18  Temp: (!) 36.3 C   SpO2: 100% 100%    Last Pain:  Vitals:   03/07/20 0846  TempSrc: Temporal  PainSc:                  Lailah Marcelli K

## 2020-03-07 NOTE — H&P (Signed)
Cephas Darby, MD [address]  [place]  [place], [place] [address]  Main: [phone]  Fax: [phone] Pager: [phone]  Primary Care Physician:  Gladstone Lighter, MD Primary Gastroenterologist:  Dr. Cephas Darby  Pre-Procedure History & Physical: HPI:  Breanna Ramos is a 49 y.o. female is here for an colonoscopy.   Past Medical History:  Diagnosis Date  . Arthritis   . Diabetes mellitus without complication ([place])   . Fibromyalgia   . H/O cardiac radiofrequency ablation   . Hypertension   . 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 BACK SURGERY     x 2  . CARDIAC ELECTROPHYSIOLOGY STUDY AND ABLATION    . CHOLECYSTECTOMY    . FRACTURE SURGERY     Left Ankle    Prior to Admission medications   Medication Sig Start Date End Date Taking? Authorizing Provider  bisoprolol (ZEBETA) 5 MG tablet Take 2.5 mg by mouth daily.  12/19/19  Yes [provider]  buPROPion (WELLBUTRIN XL) 300 MG 24 hr tablet Take 300 mg by mouth daily.  02/07/20 02/06/21 Yes [provider]  cabergoline (DOSTINEX) 0.5 MG tablet Take 0.5 mg by mouth 2 (two) times a week. Tuesday/Saturday 09/18/19  Yes [provider]  celecoxib (CELEBREX) 200 MG capsule Take 200 mg by mouth at bedtime.    Yes [provider]  Cholecalciferol 50 MCG (2000 UT) CAPS Take 2,000 Units by mouth daily.    Yes [provider]  cyanocobalamin 1000 MCG tablet Take 1,000 mcg by mouth daily.    Yes [provider]  cyclobenzaprine (FLEXERIL) 10 MG tablet Take 10 mg by mouth 2 (two) times daily.  03/01/16  Yes [provider]  diltiazem (TIAZAC) 240 MG 24 hr capsule Take 240 mg by mouth daily.    Yes [provider]  FLUoxetine (PROZAC) 40 MG capsule Take 40 mg by mouth daily.   Yes [provider]  gabapentin (NEURONTIN) 300 MG capsule Take 300 mg by mouth daily.  09/26/19  Yes [provider]    loratadine (CLARITIN) 10 MG tablet Take 10 mg by mouth daily.    Yes [provider]  LORazepam (ATIVAN) 1 MG tablet Take 1 mg by mouth daily as needed for anxiety.    Yes [provider]  Magnesium Oxide 500 MG TABS Take 1 tablet by mouth daily.   Yes [provider]  Melatonin 5 MG CAPS Take 5 mg by mouth at bedtime.    Yes [provider]  metFORMIN (GLUCOPHAGE) 500 MG tablet Take 1,000 mg by mouth 2 (two) times daily with a meal.   Yes [provider]  Multiple Vitamin (MULTI-VITAMIN) tablet Take 1 tablet by mouth daily.   Yes [provider]  omeprazole (PRILOSEC) 40 MG capsule Take 40 mg by mouth daily.  02/21/16  Yes [provider]  ondansetron (ZOFRAN) 4 MG tablet Take 4 mg by mouth every 8 (eight) hours as needed for nausea or vomiting.   Yes [provider]  oxyCODONE-acetaminophen (PERCOCET) 10-325 MG tablet Take 1 tablet by mouth every 6 (six) hours as needed for pain.  03/02/16  Yes [provider]  Sod Picosulfate-Mag Ox-Cit Acd (CLENPIQ) 10-3.5-12 MG-GM -GM/160ML SOLN Take 320 mLs by mouth daily. 02/17/20  Yes Jazmon Kos, Tally Due, MD  ascorbic acid (VITAMIN C) 500 MG tablet Take 500 mg by mouth daily.     [provider]  docusate sodium (COLACE) 100 MG capsule Take 1 capsule (100 mg total) by mouth daily. 02/16/20 03/17/20  Sidney Ace, MD  polyethylene glycol (MIRALAX / GLYCOLAX) 17 g packet Take 17 g by mouth daily. 02/15/20 03/16/20  Sidney Ace, MD    Allergies as of 02/17/2020 - Review Complete 02/17/2020  Allergen Reaction Noted  . Codeine Other (See Comments) 03/07/2019    Family History  Problem Relation Age of Onset  . Stroke Mother   . Diabetes Mother   . Heart disease Mother   . Breast cancer Mother 2  . Stroke Father     Social History   Socioeconomic History  . Marital status: Significant Other    Spouse name: Not on file  . Number of children: Not on  file  . Years of education: Not on file  . Highest education level: Not on file  Occupational History  . Not on file  Tobacco Use  . Smoking status: Current Every Day Smoker    Packs/day: 0.25  . Smokeless tobacco: Never Used  Substance and Sexual Activity  . Alcohol use: Yes    Comment: ~1 beer/week  . Drug use: Never  . Sexual activity: Not on file  Other Topics Concern  . Not on file  Social History Narrative  . Not on file   Social Determinants of Health   Financial Resource Strain:   . Difficulty of Paying Living Expenses: Not on file  Food Insecurity:   . Worried About Charity fundraiser in the Last Year: Not on file  . Ran Out of Food in the Last Year: Not on file  Transportation Needs:   . Lack of Transportation (Medical): Not on file  . Lack of Transportation (Non-Medical): Not on file  Physical Activity:   . Days of Exercise per Week: Not on file  . Minutes of Exercise per Session: Not on file  Stress:   . Feeling of Stress : Not on file  Social Connections:   . Frequency of Communication with Friends and Family: Not on file  . Frequency of Social Gatherings with Friends and Family: Not on file  . Attends Religious Services: Not on file  . Active Member of Clubs or Organizations: Not on file  . Attends Archivist Meetings: Not on file  . Marital Status: Not on file  Intimate Partner Violence:   . Fear of Current or Ex-Partner: Not on file  . Emotionally Abused: Not on file  . Physically Abused: Not on file  . Sexually Abused: Not on file    Review of Systems: See HPI, otherwise negative ROS  Physical Exam: BP (!) 155/101   Pulse 84   Temp 97.6 F (36.4 C) (Temporal)   Resp 18   Ht 5' 6.5" (1.689 m)   Wt 80.3 kg   LMP 02/09/2020 Comment: Negative urine pregnancy 03-07-20 ([facility])  SpO2 100%   BMI 28.14 kg/m  General:   Alert,  pleasant and cooperative in NAD Head:  Normocephalic and atraumatic. Neck:  Supple; no masses or  thyromegaly. Lungs:  Clear throughout to auscultation.    Heart:  Regular rate and rhythm. Abdomen:  Soft, nontender and nondistended. Normal bowel sounds, without guarding, and without rebound.   Neurologic:  Alert and  oriented x4;  grossly normal neurologically.  Impression/Plan: Breanna Ramos is here for an colonoscopy to be performed for rectal bleeding, h/o partial SBO  Risks, benefits, limitations, and alternatives regarding  colonoscopy have been reviewed  with the patient.  Questions have been answered.  All parties agreeable.   Sherri Sear, MD  03/07/2020, 8:14 AM

## 2020-03-08 ENCOUNTER — Encounter: Payer: Self-pay | Admitting: Gastroenterology

## 2020-03-09 ENCOUNTER — Encounter: Payer: Self-pay | Admitting: Gastroenterology

## 2020-03-12 ENCOUNTER — Other Ambulatory Visit: Payer: Self-pay | Admitting: Gastroenterology

## 2020-03-12 ENCOUNTER — Encounter: Payer: Self-pay | Admitting: Gastroenterology

## 2020-03-12 DIAGNOSIS — K51211 Ulcerative (chronic) proctitis with rectal bleeding: Secondary | ICD-10-CM

## 2020-03-12 LAB — SURGICAL PATHOLOGY

## 2020-03-12 MED ORDER — MESALAMINE 1000 MG RE SUPP: 1000.0000 mg | Freq: Every day | RECTAL | 1 refills | Status: DC

## 2020-03-15 ENCOUNTER — Other Ambulatory Visit: Payer: Self-pay

## 2020-03-15 ENCOUNTER — Ambulatory Visit: Payer: BLUE CROSS/BLUE SHIELD | Admitting: Gastroenterology

## 2020-03-15 ENCOUNTER — Encounter: Payer: Self-pay | Admitting: Gastroenterology

## 2020-03-15 VITALS — BP 156/92 | HR 101 | Temp 98.5°F | Ht 66.5 in | Wt 182.0 lb

## 2020-03-15 DIAGNOSIS — D509 Iron deficiency anemia, unspecified: Secondary | ICD-10-CM

## 2020-03-15 DIAGNOSIS — Z9889 Other specified postprocedural states: Secondary | ICD-10-CM | POA: Diagnosis not present

## 2020-03-15 DIAGNOSIS — K51211 Ulcerative (chronic) proctitis with rectal bleeding: Secondary | ICD-10-CM | POA: Diagnosis not present

## 2020-03-15 DIAGNOSIS — Z8781 Personal history of (healed) traumatic fracture: Secondary | ICD-10-CM

## 2020-03-15 NOTE — Progress Notes (Signed)
Cephas Darby, MD [address]  [place]  [place], [place] [address]  Main: [phone]  Fax: [phone]    Gastroenterology Consultation  Referring Provider:     Gladstone Lighter, MD Primary Care Physician:  Gladstone Lighter, MD Primary Gastroenterologist:  Dr. Cephas Darby Reason for Consultation: Ulcerative proctitis        HPI:   Breanna Ramos is a 49 y.o. female referred by Dr. Gladstone Lighter, MD  for consultation & management of rectal bleeding. Patient reports several months history of painless rectal bleeding on wiping as well as in the toilet bowl, mixed with stool.  She has history of chronic opioid use, chronic constipation, her bowel frequency anywhere from every 3 to 4 days to once a week.  She takes MiraLAX as needed.  She also reports weight loss of about 15 pounds within last few months.  She has history of chronic tobacco use.  She was just discharged from [facility] after she was managed for partial small bowel obstruction which was thought to be in the setting of severe constipation.  She does have history of mild anemia.  She also reports menorrhagia, she has history of prolactinoma, underwent resection.  Patient denies abdominal pain, bloating, nausea or vomiting.  Follow-up visit 03/15/2020 Patient underwent colonoscopy for follow-up of rectal bleeding.  She is found to have mild rectal erythema, pathology confirmed chronic proctitis.  I have advised her to start [place], she just picked up the prescription, have not started using it yet.  She also reports that she has cut back drinking diet soda by 30% which she is proud about.  She continues to smoke.  Linzess is working to have regular bowel movements  NSAIDs: None  Antiplts/Anticoagulants/Anti thrombotics: None  GI Procedures: Reports undergoing colonoscopy about 5 to 7 years ago for rectal bleeding, reportedly normal  Colonoscopy 03/07/2020 - The examined portion of the ileum was normal. -  Diffuse moderate inflammation was found in the distal rectum secondary to proctitis. Biopsied. - The entire examined colon is normal.  DIAGNOSIS:  A. RECTUM; COLD BIOPSY:  - CHRONIC PROCTITIS WITH FOCAL ACTIVITY.  - NEGATIVE FOR DYSPLASIA AND MALIGNANCY.   Comment:  Immunohistochemical stains for CMV and HSV I/II, and in-situ  hybridization for EBER, are all negative.  She denies family history of GI malignancy  Past Medical History:  Diagnosis Date  . Arthritis   . Diabetes mellitus without complication ([place])   . Fibromyalgia   . H/O cardiac radiofrequency ablation   . Hypertension   . 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 BACK SURGERY     x 2  . CARDIAC ELECTROPHYSIOLOGY STUDY AND ABLATION    . CHOLECYSTECTOMY    . COLONOSCOPY WITH PROPOFOL N/A 03/07/2020   Procedure: COLONOSCOPY WITH PROPOFOL;  Surgeon: Lin Landsman, MD;  Location: [facility] ENDOSCOPY;  Service: Gastroenterology;  Laterality: N/A;  . FRACTURE SURGERY     Left Ankle    Current Outpatient Medications:  .  ascorbic acid (VITAMIN C) 500 MG tablet, Take 500 mg by mouth daily. , Disp: , Rfl:  .  bisoprolol (ZEBETA) 5 MG tablet, Take 2.5 mg by mouth daily. , Disp: , Rfl:  .  buPROPion (WELLBUTRIN XL) 300 MG 24 hr tablet, Take 300 mg by mouth daily. , Disp: , Rfl:  .  cabergoline (DOSTINEX) 0.5 MG tablet, Take 0.5 mg by mouth 2 (two) times a week. Tuesday/Saturday, Disp: , Rfl:  .  celecoxib (CELEBREX) 200 MG capsule, Take 200 mg by mouth at bedtime. , Disp: , Rfl:  .  Cholecalciferol 50 MCG (2000 UT) CAPS, Take 2,000 Units by mouth daily. , Disp: , Rfl:  .  cyclobenzaprine (FLEXERIL) 10 MG tablet, Take 10 mg by mouth 2 (two) times daily. , Disp: , Rfl:  .  diltiazem (TIAZAC) 240 MG 24 hr capsule, Take 240 mg by mouth daily. , Disp: , Rfl:  .  docusate sodium (COLACE) 100 MG capsule, Take 1 capsule (100 mg total) by mouth daily., Disp: 30 capsule, Rfl: 0 .   FLUoxetine (PROZAC) 40 MG capsule, Take 40 mg by mouth daily., Disp: , Rfl:  .  gabapentin (NEURONTIN) 300 MG capsule, Take 300 mg by mouth daily. , Disp: , Rfl:  .  linaclotide (LINZESS) 145 MCG CAPS capsule, Take 1 capsule (145 mcg total) by mouth daily before breakfast., Disp: 30 capsule, Rfl: 2 .  loratadine (CLARITIN) 10 MG tablet, Take 10 mg by mouth daily. , Disp: , Rfl:  .  LORazepam (ATIVAN) 1 MG tablet, Take 1 mg by mouth daily as needed for anxiety. , Disp: , Rfl:  .  Magnesium Oxide 500 MG TABS, Take 1 tablet by mouth daily., Disp: , Rfl:  .  Melatonin 5 MG CAPS, Take 5 mg by mouth at bedtime. , Disp: , Rfl:  .  mesalamine (CANASA) 1000 MG suppository, Place 1 suppository (1,000 mg total) rectally at bedtime., Disp: 30 suppository, Rfl: 1 .  metFORMIN (GLUCOPHAGE) 500 MG tablet, Take 1,000 mg by mouth 2 (two) times daily with a meal., Disp: , Rfl:  .  Multiple Vitamin (MULTI-VITAMIN) tablet, Take 1 tablet by mouth daily., Disp: , Rfl:  .  omeprazole (PRILOSEC) 40 MG capsule, Take 40 mg by mouth daily. , Disp: , Rfl:  .  ondansetron (ZOFRAN) 4 MG tablet, Take 4 mg by mouth every 8 (eight) hours as needed for nausea or vomiting., Disp: , Rfl:  .  oxyCODONE-acetaminophen (PERCOCET) 10-325 MG tablet, Take 1 tablet by mouth every 6 (six) hours as needed for pain. , Disp: , Rfl:  .  polyethylene glycol (MIRALAX / GLYCOLAX) 17 g packet, Take 17 g by mouth daily., Disp: 30 packet, Rfl: 0 .  Sod Picosulfate-Mag Ox-Cit Acd (CLENPIQ) 10-3.5-12 MG-GM -GM/160ML SOLN, Take 320 mLs by mouth daily., Disp: 320 mL, Rfl: 0   Family History  Problem Relation Age of Onset  . Stroke Mother   . Diabetes Mother   . Heart disease Mother   . Breast cancer Mother 69  . Stroke Father      Social History   Tobacco Use  . Smoking status: Current Every Day Smoker    Packs/day: 0.25  . Smokeless tobacco: Never Used  Substance Use Topics  . Alcohol use: Yes    Comment: ~1 beer/week  . Drug use: Never     Allergies as of 03/15/2020 - Review Complete 03/15/2020  Allergen Reaction Noted  . Codeine Other (See Comments) 03/07/2019    Review of Systems:    All systems reviewed and negative except where noted in HPI.   Physical Exam:  BP (!) 156/92 (BP Location: Left Arm, Patient Position: Sitting, Cuff Size: Normal)   Pulse (!) 101   Temp 98.5 F (36.9 C) (Oral)   Ht 5' 6.5" (1.689 m)   Wt 182 lb (82.6 kg)   BMI 28.94 kg/m  No LMP recorded.  General:   Alert,  Well-developed, well-nourished, pleasant and cooperative in  NAD Head:  Normocephalic and atraumatic. Eyes:  Sclera clear, no icterus.   Conjunctiva pink. Ears:  Normal auditory acuity. Nose:  No deformity, discharge, or lesions. Mouth:  No deformity or lesions,oropharynx pink & moist. Neck:  Supple; no masses or thyromegaly. Lungs:  Respirations even and unlabored.  Clear throughout to auscultation.   No wheezes, crackles, or rhonchi. No acute distress. Heart:  Regular rate and rhythm; no murmurs, clicks, rubs, or gallops. Abdomen:  Normal bowel sounds. Soft, non-tender and non-distended without masses, hepatosplenomegaly or hernias noted.  No guarding or rebound tenderness.   Rectal: Not performed Msk:  Symmetrical without gross deformities. Good, equal movement & strength bilaterally. Pulses:  Normal pulses noted. Extremities:  No clubbing or edema.  No cyanosis. Neurologic:  Alert and oriented x3;  grossly normal neurologically. Skin:  Intact without significant lesions or rashes. No jaundice. Psych:  Alert and cooperative. Normal mood and affect.  Imaging Studies: Reviewed  Assessment and Plan:   Breanna Ramos is a 49 y.o. Caucasian female with history of anxiety, depression, diabetes, fibromyalgia, history of prolactinoma, radiofrequency ablation for SVT, chronic opioid use, opioid-induced constipation is seen in consultation for painless rectal bleeding.  Colonoscopy confirmed chronic proctitis  Chronic  proctitis: Source of rectal bleeding  Start Canasa 1 g suppository at bedtime for 2 weeks, then decrease to every other day for 2 weeks, then stop if bleeding resolves I have discussed in length regarding the diagnosis, progression if left untreated, risk of waxing and waning of symptoms and the need for stepup therapy if her disease progresses  Chronic constipation, opioid induced Continue Linzess 145 MCG daily Encouraged her to further cut back on soda Adequate intake of water  Iron deficiency without anemia Trial of fusion plus, samples provided  History of nasal bone fracture Patient is requesting to be referred to ENT.  During recent small bowel obstruction, there was difficulty inserting NG tube due to history of nasal bone fracture   Follow up in 3 months   Cephas Darby, MD

## 2020-04-05 ENCOUNTER — Other Ambulatory Visit: Payer: Self-pay | Admitting: Gastroenterology

## 2020-04-05 DIAGNOSIS — K51211 Ulcerative (chronic) proctitis with rectal bleeding: Secondary | ICD-10-CM

## 2020-06-01 ENCOUNTER — Other Ambulatory Visit: Payer: Self-pay | Admitting: Gastroenterology

## 2020-06-14 ENCOUNTER — Ambulatory Visit: Payer: BLUE CROSS/BLUE SHIELD | Admitting: Gastroenterology

## 2020-07-12 ENCOUNTER — Ambulatory Visit: Payer: BLUE CROSS/BLUE SHIELD | Admitting: Gastroenterology

## 2020-08-24 DIAGNOSIS — Z8719 Personal history of other diseases of the digestive system: Secondary | ICD-10-CM

## 2020-08-24 HISTORY — DX: Personal history of other diseases of the digestive system: Z87.19

## 2020-09-10 ENCOUNTER — Other Ambulatory Visit: Payer: Self-pay

## 2020-09-13 ENCOUNTER — Other Ambulatory Visit: Payer: Self-pay

## 2020-09-13 ENCOUNTER — Ambulatory Visit: Payer: BLUE CROSS/BLUE SHIELD | Admitting: Gastroenterology

## 2020-09-13 ENCOUNTER — Encounter: Payer: Self-pay | Admitting: Gastroenterology

## 2020-09-13 VITALS — BP 148/91 | HR 99 | Temp 98.4°F | Ht 66.5 in | Wt 198.0 lb

## 2020-09-13 DIAGNOSIS — K5909 Other constipation: Secondary | ICD-10-CM

## 2020-09-13 DIAGNOSIS — K219 Gastro-esophageal reflux disease without esophagitis: Secondary | ICD-10-CM

## 2020-09-13 DIAGNOSIS — K51211 Ulcerative (chronic) proctitis with rectal bleeding: Secondary | ICD-10-CM | POA: Diagnosis not present

## 2020-09-13 MED ORDER — PANTOPRAZOLE SODIUM 40 MG PO TBEC: 40.0000 mg | DELAYED_RELEASE_TABLET | Freq: Two times a day (BID) | ORAL | 3 refills | Status: DC

## 2020-09-13 NOTE — Progress Notes (Signed)
Cephas Darby, MD [address]  [place]  [place], [place] [address]  Main: [phone]  Fax: [phone]    Gastroenterology Consultation  Referring Provider:     Gladstone Lighter, MD Primary Care Physician:  Gladstone Lighter, MD Primary Gastroenterologist:  Dr. Cephas Darby Reason for Consultation: Ulcerative proctitis        HPI:   Breanna Ramos is a 50 y.o. female referred by Dr. Gladstone Lighter, MD  for consultation & management of rectal bleeding. Patient reports several months history of painless rectal bleeding on wiping as well as in the toilet bowl, mixed with stool.  She has history of chronic opioid use, chronic constipation, her bowel frequency anywhere from every 3 to 4 days to once a week.  She takes MiraLAX as needed.  She also reports weight loss of about 15 pounds within last few months.  She has history of chronic tobacco use.  She was just discharged from [facility] after she was managed for partial small bowel obstruction which was thought to be in the setting of severe constipation.  She does have history of mild anemia.  She also reports menorrhagia, she has history of prolactinoma, underwent resection.  Patient denies abdominal pain, bloating, nausea or vomiting.  Follow-up visit 03/15/2020 Patient underwent colonoscopy for follow-up of rectal bleeding.  She is found to have mild rectal erythema, pathology confirmed chronic proctitis.  I have advised her to start [place], she just picked up the prescription, have not started using it yet.  She also reports that she has cut back drinking diet soda by 30% which she is proud about.  She continues to smoke.  Linzess is working to have regular bowel movements  Follow-up visit 09/14/2018 Patient is for follow-up of ulcerative proctitis.  She says she never took Canasa suppository as her rectal bleeding has resolved along with her constipation.  Linzess 145 MCG did not provide any relief, her PCP started her on  Amitiza 8 MCG twice daily which she is currently taking, having regular bowel movements.  Taking MiraLAX as needed overnight.  She has history of chronic heartburn secondary to cabergoline, currently taking omeprazole 40 mg twice daily.  She is requesting to switch to Protonix 40 mg twice daily.  She does not have any other GI concerns today.  She reports having had an upper endoscopy several years ago, reportedly normal  NSAIDs: None  Antiplts/Anticoagulants/Anti thrombotics: None  GI Procedures: Reports undergoing colonoscopy about 5 to 7 years ago for rectal bleeding, reportedly normal  Colonoscopy 03/07/2020 - The examined portion of the ileum was normal. - Diffuse moderate inflammation was found in the distal rectum secondary to proctitis. Biopsied. - The entire examined colon is normal.  DIAGNOSIS:  A. RECTUM; COLD BIOPSY:  - CHRONIC PROCTITIS WITH FOCAL ACTIVITY.  - NEGATIVE FOR DYSPLASIA AND MALIGNANCY.   Comment:  Immunohistochemical stains for CMV and HSV I/II, and in-situ  hybridization for EBER, are all negative.  She denies family history of GI malignancy  Past Medical History:  Diagnosis Date  . Arthritis   . Diabetes mellitus without complication ([place])   . Fibromyalgia   . H/O cardiac radiofrequency ablation   . Hypertension   . 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 BACK SURGERY     x 2  . CARDIAC ELECTROPHYSIOLOGY STUDY AND ABLATION    . CHOLECYSTECTOMY    . COLONOSCOPY WITH PROPOFOL N/A 03/07/2020   Procedure: COLONOSCOPY  WITH PROPOFOL;  Surgeon: Lin Landsman, MD;  Location: [facility] ENDOSCOPY;  Service: Gastroenterology;  Laterality: N/A;  . FRACTURE SURGERY     Left Ankle    Current Outpatient Medications:  .  ascorbic acid (VITAMIN C) 500 MG tablet, Take 500 mg by mouth daily. , Disp: , Rfl:  .  bisoprolol (ZEBETA) 5 MG tablet, Take 2.5 mg by mouth daily. , Disp: , Rfl:  .  buPROPion (WELLBUTRIN XL) 300  MG 24 hr tablet, Take 300 mg by mouth daily. , Disp: , Rfl:  .  cabergoline (DOSTINEX) 0.5 MG tablet, Take 0.5 mg by mouth 2 (two) times a week. Tuesday/Saturday, Disp: , Rfl:  .  celecoxib (CELEBREX) 200 MG capsule, Take 200 mg by mouth at bedtime. , Disp: , Rfl:  .  Cholecalciferol 50 MCG (2000 UT) CAPS, Take 2,000 Units by mouth daily. , Disp: , Rfl:  .  cyclobenzaprine (FLEXERIL) 10 MG tablet, Take 10 mg by mouth 2 (two) times daily. , Disp: , Rfl:  .  diltiazem (TIAZAC) 240 MG 24 hr capsule, Take 240 mg by mouth daily. , Disp: , Rfl:  .  Docusate Sodium (DSS) 100 MG CAPS, docusate sodium 100 mg capsule, Disp: , Rfl:  .  FLUoxetine (PROZAC) 40 MG capsule, Take 40 mg by mouth daily., Disp: , Rfl:  .  gabapentin (NEURONTIN) 300 MG capsule, Take 300 mg by mouth daily. , Disp: , Rfl:  .  loratadine (CLARITIN) 10 MG tablet, Take 10 mg by mouth daily. , Disp: , Rfl:  .  LORazepam (ATIVAN) 1 MG tablet, Take 1 mg by mouth daily as needed for anxiety. , Disp: , Rfl:  .  lubiprostone (AMITIZA) 8 MCG capsule, lubiprostone 8 mcg capsule  TAKE 1 CAPSULE (8 MCG TOTAL) BY MOUTH 2 (TWO) TIMES DAILY WITH MEALS FOR 30 DAYS, Disp: , Rfl:  .  Magnesium Oxide 500 MG TABS, Take 1 tablet by mouth daily., Disp: , Rfl:  .  Melatonin 5 MG CAPS, Take 5 mg by mouth at bedtime. , Disp: , Rfl:  .  mesalamine (CANASA) 1000 MG suppository, Place 1 suppository (1,000 mg total) rectally at bedtime., Disp: 30 suppository, Rfl: 1 .  metFORMIN (GLUCOPHAGE) 500 MG tablet, Take 1,000 mg by mouth 2 (two) times daily with a meal., Disp: , Rfl:  .  Multiple Vitamin (MULTI-VITAMIN) tablet, Take 1 tablet by mouth daily., Disp: , Rfl:  .  ondansetron (ZOFRAN) 4 MG tablet, Take 4 mg by mouth every 8 (eight) hours as needed for nausea or vomiting., Disp: , Rfl:  .  oxyCODONE-acetaminophen (PERCOCET) 10-325 MG tablet, Take 1 tablet by mouth every 6 (six) hours as needed for pain. , Disp: , Rfl:  .  pantoprazole (PROTONIX) 40 MG tablet, Take  1 tablet (40 mg total) by mouth 2 (two) times daily before a meal., Disp: 180 tablet, Rfl: 3   Family History  Problem Relation Age of Onset  . Stroke Mother   . Diabetes Mother   . Heart disease Mother   . Breast cancer Mother 50  . Stroke Father      Social History   Tobacco Use  . Smoking status: Current Every Day Smoker    Packs/day: 0.25  . Smokeless tobacco: Never Used  Substance Use Topics  . Alcohol use: Yes    Comment: ~1 beer/week  . Drug use: Never    Allergies as of 09/13/2020 - Review Complete 09/13/2020  Allergen Reaction Noted  . Codeine Other (  See Comments) 03/07/2019    Review of Systems:    All systems reviewed and negative except where noted in HPI.   Physical Exam:  BP (!) 148/91 (BP Location: Left Arm, Patient Position: Sitting, Cuff Size: Normal)   Pulse 99   Temp 98.4 F (36.9 C) (Oral)   Ht 5' 6.5" (1.689 m)   Wt 198 lb (89.8 kg)   BMI 31.48 kg/m  No LMP recorded.  General:   Alert,  Well-developed, well-nourished, pleasant and cooperative in NAD Head:  Normocephalic and atraumatic. Eyes:  Sclera clear, no icterus.   Conjunctiva pink. Ears:  Normal auditory acuity. Nose:  No deformity, discharge, or lesions. Mouth:  No deformity or lesions,oropharynx pink & moist. Neck:  Supple; no masses or thyromegaly. Lungs:  Respirations even and unlabored.  Clear throughout to auscultation.   No wheezes, crackles, or rhonchi. No acute distress. Heart:  Regular rate and rhythm; no murmurs, clicks, rubs, or gallops. Abdomen:  Normal bowel sounds. Soft, non-tender and non-distended without masses, hepatosplenomegaly or hernias noted.  No guarding or rebound tenderness.   Rectal: Not performed Msk:  Symmetrical without gross deformities. Good, equal movement & strength bilaterally. Pulses:  Normal pulses noted. Extremities:  No clubbing or edema.  No cyanosis. Neurologic:  Alert and oriented x3;  grossly normal neurologically. Skin:  Intact without  significant lesions or rashes. No jaundice. Psych:  Alert and cooperative. Normal mood and affect.  Imaging Studies: Reviewed  Assessment and Plan:   Breanna Ramos is a 50 y.o. Caucasian female with history of anxiety, depression, diabetes, fibromyalgia, history of prolactinoma, radiofrequency ablation for SVT, chronic opioid use, opioid-induced constipation is seen in consultation for painless rectal bleeding.  Colonoscopy confirmed chronic proctitis  Chronic proctitis: Source of rectal bleeding  Currently resolved.  Discussed about Canasa 1 g suppository at bedtime for 2 weeks, then decrease to every other day for 2 weeks if she develops clear I have discussed in length regarding the diagnosis, progression if left untreated, risk of waxing and waning of symptoms and the need for stepup therapy if her disease progresses  Chronic constipation, opioid induced Linzess 145 MCG daily did not work Currently on Amitiza 8 MCG twice daily Encouraged her to further cut back on soda Adequate intake of water  Chronic heartburn Switch to Protonix 40 mg twice daily as requested   Follow up in 3 months   Cephas Darby, MD

## 2021-02-21 ENCOUNTER — Other Ambulatory Visit: Payer: Self-pay | Admitting: Internal Medicine

## 2021-02-21 DIAGNOSIS — Z1231 Encounter for screening mammogram for malignant neoplasm of breast: Secondary | ICD-10-CM

## 2021-07-02 ENCOUNTER — Other Ambulatory Visit: Payer: Self-pay

## 2021-07-02 ENCOUNTER — Emergency Department: Payer: BLUE CROSS/BLUE SHIELD

## 2021-07-02 ENCOUNTER — Observation Stay
Admission: EM | Admit: 2021-07-02 | Discharge: 2021-07-04 | Disposition: A | Discharge status: Home / Self Care | Payer: BLUE CROSS/BLUE SHIELD | Attending: Internal Medicine | Admitting: Internal Medicine

## 2021-07-02 DIAGNOSIS — Z20822 Contact with and (suspected) exposure to covid-19: Secondary | ICD-10-CM | POA: Diagnosis not present

## 2021-07-02 DIAGNOSIS — Z79899 Other long term (current) drug therapy: Secondary | ICD-10-CM | POA: Diagnosis not present

## 2021-07-02 DIAGNOSIS — Z7984 Long term (current) use of oral hypoglycemic drugs: Secondary | ICD-10-CM | POA: Diagnosis not present

## 2021-07-02 DIAGNOSIS — I639 Cerebral infarction, unspecified: Secondary | ICD-10-CM

## 2021-07-02 DIAGNOSIS — Z9889 Other specified postprocedural states: Secondary | ICD-10-CM

## 2021-07-02 DIAGNOSIS — Z87898 Personal history of other specified conditions: Secondary | ICD-10-CM | POA: Diagnosis not present

## 2021-07-02 DIAGNOSIS — F17210 Nicotine dependence, cigarettes, uncomplicated: Secondary | ICD-10-CM | POA: Diagnosis not present

## 2021-07-02 DIAGNOSIS — F32A Depression, unspecified: Secondary | ICD-10-CM | POA: Diagnosis present

## 2021-07-02 DIAGNOSIS — D352 Benign neoplasm of pituitary gland: Secondary | ICD-10-CM | POA: Diagnosis present

## 2021-07-02 DIAGNOSIS — I10 Essential (primary) hypertension: Secondary | ICD-10-CM | POA: Diagnosis not present

## 2021-07-02 DIAGNOSIS — F1190 Opioid use, unspecified, uncomplicated: Secondary | ICD-10-CM

## 2021-07-02 DIAGNOSIS — Z8679 Personal history of other diseases of the circulatory system: Secondary | ICD-10-CM | POA: Insufficient documentation

## 2021-07-02 DIAGNOSIS — M961 Postlaminectomy syndrome, not elsewhere classified: Secondary | ICD-10-CM | POA: Diagnosis present

## 2021-07-02 DIAGNOSIS — G459 Transient cerebral ischemic attack, unspecified: Principal | ICD-10-CM | POA: Insufficient documentation

## 2021-07-02 DIAGNOSIS — G8929 Other chronic pain: Secondary | ICD-10-CM | POA: Insufficient documentation

## 2021-07-02 DIAGNOSIS — E119 Type 2 diabetes mellitus without complications: Secondary | ICD-10-CM | POA: Insufficient documentation

## 2021-07-02 DIAGNOSIS — Z8719 Personal history of other diseases of the digestive system: Secondary | ICD-10-CM

## 2021-07-02 DIAGNOSIS — G4733 Obstructive sleep apnea (adult) (pediatric): Secondary | ICD-10-CM

## 2021-07-02 DIAGNOSIS — G08 Intracranial and intraspinal phlebitis and thrombophlebitis: Secondary | ICD-10-CM | POA: Insufficient documentation

## 2021-07-02 DIAGNOSIS — R42 Dizziness and giddiness: Secondary | ICD-10-CM | POA: Diagnosis present

## 2021-07-02 HISTORY — DX: Cerebral infarction, unspecified: I63.9

## 2021-07-02 LAB — CBC
HCT: 39.5 % (ref 36.0–46.0)
Hemoglobin: 13 g/dL (ref 12.0–15.0)
MCH: 30.5 pg (ref 26.0–34.0)
MCHC: 32.9 g/dL (ref 30.0–36.0)
MCV: 92.7 fL (ref 80.0–100.0)
Platelets: 354 10*3/uL (ref 150–400)
RBC: 4.26 MIL/uL (ref 3.87–5.11)
RDW: 13.2 % (ref 11.5–15.5)
WBC: 8 10*3/uL (ref 4.0–10.5)
nRBC: 0 % (ref 0.0–0.2)

## 2021-07-02 LAB — URINALYSIS, ROUTINE W REFLEX MICROSCOPIC
Bilirubin Urine: NEGATIVE
Glucose, UA: NEGATIVE mg/dL
Hgb urine dipstick: NEGATIVE
Ketones, ur: NEGATIVE mg/dL
Nitrite: NEGATIVE
Protein, ur: NEGATIVE mg/dL
Specific Gravity, Urine: 1.021 (ref 1.005–1.030)
pH: 5 (ref 5.0–8.0)

## 2021-07-02 LAB — APTT: aPTT: 26 seconds (ref 24–36)

## 2021-07-02 LAB — BASIC METABOLIC PANEL
Anion gap: 7 (ref 5–15)
BUN: 18 mg/dL (ref 6–20)
CO2: 28 mmol/L (ref 22–32)
Calcium: 9.2 mg/dL (ref 8.9–10.3)
Chloride: 101 mmol/L (ref 98–111)
Creatinine, Ser: 0.67 mg/dL (ref 0.44–1.00)
GFR, Estimated: >60 mL/min (ref >60)
Glucose, Bld: 102 mg/dL — ABNORMAL HIGH (ref 70–99)
Potassium: 3.9 mmol/L (ref 3.5–5.1)
Sodium: 136 mmol/L (ref 135–145)

## 2021-07-02 LAB — CBG MONITORING, ED: Glucose-Capillary: 128 mg/dL — ABNORMAL HIGH (ref 70–99)

## 2021-07-02 LAB — PROTIME-INR
INR: 0.9 (ref 0.8–1.2)
Prothrombin Time: 12.2 seconds (ref 11.4–15.2)

## 2021-07-02 MED ORDER — GADOBUTROL 1 MMOL/ML IV SOLN
9.0000 mL | Freq: Once | INTRAVENOUS | Status: AC | PRN
  Administered 2021-07-02: 9 mL via INTRAVENOUS

## 2021-07-02 MED ORDER — STROKE: EARLY STAGES OF RECOVERY BOOK: Freq: Once | Status: DC

## 2021-07-02 MED ORDER — ACETAMINOPHEN 650 MG RE SUPP: 650.0000 mg | RECTAL | Status: DC | PRN

## 2021-07-02 MED ORDER — HEPARIN (PORCINE) 25000 UT/250ML-% IV SOLN
1450.0000 [IU]/h | INTRAVENOUS | Status: DC
  Administered 2021-07-02: 22:00:00 1300 [IU]/h via INTRAVENOUS
  Filled 2021-07-02: qty 250

## 2021-07-02 MED ORDER — INSULIN ASPART 100 UNIT/ML IJ SOLN: 0.0000 [IU] | Freq: Every day | INTRAMUSCULAR | Status: DC

## 2021-07-02 MED ORDER — INSULIN ASPART 100 UNIT/ML IJ SOLN
0.0000 [IU] | Freq: Three times a day (TID) | INTRAMUSCULAR | Status: DC
  Administered 2021-07-03: 3 [IU] via SUBCUTANEOUS
  Filled 2021-07-02: qty 1

## 2021-07-02 MED ORDER — IOHEXOL 350 MG/ML SOLN
75.0000 mL | Freq: Once | INTRAVENOUS | Status: AC | PRN
  Administered 2021-07-02: 75 mL via INTRAVENOUS

## 2021-07-02 MED ORDER — HEPARIN BOLUS VIA INFUSION
4700.0000 [IU] | Freq: Once | INTRAVENOUS | Status: AC
  Administered 2021-07-02: 4700 [IU] via INTRAVENOUS
  Filled 2021-07-02: qty 4700

## 2021-07-02 MED ORDER — ACETAMINOPHEN 325 MG PO TABS: 650.0000 mg | ORAL_TABLET | ORAL | Status: DC | PRN

## 2021-07-02 MED ORDER — ACETAMINOPHEN 160 MG/5ML PO SOLN
650.0000 mg | ORAL | Status: DC | PRN
  Filled 2021-07-02: qty 20.3

## 2021-07-02 NOTE — Assessment & Plan Note (Signed)
CPAP nightly if desired

## 2021-07-02 NOTE — Consult Note (Signed)
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 it is interpreted as acute versus chronic on CTV. it certainly is possible the patient's transient neurologic symptoms earlier today were the result of this thrombus although it is unusual she does not have an impressive headache and that she's feeling better without any intervention or anticoagulation. Agree with starting heparin drip for now especially with reassuring MRI of the brain without any evidence of stroke. The patient is clinically back to baseline and doing very well. Would observe overnight given her several hours worth of word finding difficulty. Recommend hypercoagulable workup with factor v mutation protein c and s antithrombin mutation. APL ab. TSH.  Our recommendations are outlined below.  DVT Prophylaxis: SCDs, Pneumatic Compression.  Disposition: Neurology will follow.    ----------------------------------------------------------------------------------------------------  Metrics: [facility] Notification Time: 07/02/2021 20:25:55 Stamp Time: 07/02/2021 40:34:74 Callback Response Time: 07/02/2021 20:26:43   CT HEAD: As Per Radiologist CT Head Showed No Acute Hemorrhage or Acute Core Infarct    ----------------------------------------------------------------------------------------------------  Chief Complaint: trouble getting her words out.  History of Present Illness: Patient is a 51 year old Female. The patient is a 51 year old woman who had a transient spell of feeling foggy and dizzy and lightheaded with shaky hands. She had trouble getting her words out. this lasted  from approximately 9:45 in the morning until 3 PM she started to feel better. She denies any other focal weakness numbness or tingling. 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 she had the diagnosis of the tumor secondary to abnormal periods and she started producing breast milk. she does not take any blood thinners at home. She only has a mild headache that is not atypical for her. the patient had a CT venogram showing irregular filling defect in the right transverse sinus. MRI of the brain did not show any evidence of acute ischemic stroke.   Past Medical History:      Hypertension      Diabetes Mellitus      There is no history of Atrial Fibrillation  Medications:  No Anticoagulant use  No Antiplatelet use Reviewed EMR for current medications  Allergies:  Reviewed  Social History: Drug Use: No  Family History:  There Is Family History Of: reviewed and noncontributory There is no family history of premature cerebrovascular disease pertinent to this consultation  ROS : 14 Points Review of Systems was performed and was negative except mentioned in HPI.  Past Surgical History: There Is No Surgical History Contributory To Todays Visit   Examination: BP(133/91), Pulse(75), Blood Glucose(102) 1A: Level of Consciousness - Alert; keenly responsive + 0 1B: Ask Month and Age - Both Questions Right + 0 1C: Blink Eyes & Squeeze Hands - Performs Both Tasks + 0 2: Test Horizontal Extraocular Movements - Normal + 0 3: Test Visual Fields - No Visual Loss + 0 4: Test Facial Palsy (Use Grimace if Obtunded) - Normal symmetry + 0 5A: Test Left Arm Motor Drift - No Drift for 10 Seconds + 0 5B: Test Right Arm Motor Drift - No Drift for 10 Seconds + 0 6A: Test Left Leg Motor Drift - No  Drift for 5 Seconds + 0 6B: Test Right Leg Motor Drift - No Drift for 5 Seconds + 0 7: Test Limb Ataxia (FNF/Heel-Shin) - No Ataxia + 0 8: Test Sensation - Normal; No sensory loss + 0 9:  Test Language/Aphasia - Normal; No aphasia + 0 10: Test Dysarthria - Normal + 0 11: Test Extinction/Inattention - No abnormality + 0  NIHSS Score: 0     Patient / Family was informed the Neurology Consult would occur via TeleHealth consult by way of interactive audio and video telecommunications and consented to receiving care in this manner.  Patient is being evaluated for possible acute neurologic impairment and high probability of imminent or life - threatening deterioration.I spent total of 35 minutes providing care to this patient, including time for face to face visit via telemedicine, review of medical records, imaging studies and discussion of findings with providers, the patient and / or family.   Dr Katina Degree   [facility] [phone]  Case [id number]

## 2021-07-02 NOTE — ED Notes (Signed)
Tele neuro in room now

## 2021-07-02 NOTE — Assessment & Plan Note (Addendum)
CT venogram filling defect right transverse sinus approximally 2.5 cm in length compatible with thrombus. This could be acute or chronic Heparin infusion Hypercoagulability work-up, with factor V Leiden, protein C&S, APL and TSH Consider hematology consult Neurology to follow

## 2021-07-02 NOTE — Assessment & Plan Note (Deleted)
Continue diltiazem and bisoprolol

## 2021-07-02 NOTE — Assessment & Plan Note (Signed)
Chronic pain Continue opiates and gabapentin

## 2021-07-02 NOTE — ED Provider Notes (Signed)
[facility] Provider Note  Patient Contact: 3:33 PM (approximate)   History   Dizziness   HPI  Breanna Ramos is a 51 y.o. female who presents the emergency department for evaluation of lightheadedness, dizziness, difficulty formulating thoughts and words, difficulty formulating typical tasks.  Patient states that she was at work roughly 930 this morning.  She started feeling like her blood sugar had dropped.  She had just eaten but she tried eating some M&Ms to ensure that she had an of sugar.  Patient states that the symptoms persisted.  She noticed the symptoms primarily when trying to speak words.  She states that she knew that she could not find the right word but was on able to verbalize certain words.  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  She switched to another program on a computer that she has used every single day, states that she cannot remember or figure out how to use the program at the time.  She denied any unilateral weakness.  She did not have any slurring, just difficulty finding words.  Patient denied any headache, visual changes, facial droop, unilateral weakness.  No history of CVA or TIA.  Patient does have a history of diabetes, SVT, hypertension, as well as a pituitary tumor/microprolactinoma.  Patient states that she takes medication for the prolactinoma but states that she is not always compliant with same medication.  No recent illnesses.  No head trauma.  She states that symptoms lasted roughly 6 hours and then resolved as quickly as they had started.  Patient states that she feels back to her baseline at this time.  The patient's husband is in the room and corroborates the story with the patient appearing slightly altered, having difficulty formulating words but states that she is now back to her baseline at this time.     Physical Exam   Triage Vital  Signs: ED Triage Vitals [07/02/21 1415]  Enc Vitals Group     BP 127/90     Pulse Rate 95     Resp 18     Temp 98.5 F (36.9 C)     Temp Source Oral     SpO2 97 %     Weight 197 lb 15.6 oz (89.8 kg)     Height 5' 6"  (1.676 m)     Head Circumference      Peak Flow      Pain Score 3     Pain Loc      Pain Edu?      Excl. in [place]?     Most recent vital signs: Vitals:   07/02/21 1950 07/02/21 2000  BP: (!) 133/91 127/73  Pulse: 72 70  Resp: 19 19  Temp:    SpO2: 97% 96%     General: Alert and in no acute distress. Eyes:  PERRL. EOMI. Head: No acute traumatic findings  Neck: No stridor. No cervical spine tenderness to palpation. Hematological/Lymphatic/Immunilogical: No cervical lymphadenopathy. Cardiovascular:  Good peripheral perfusion.  Normal rate and rhythm.  No murmurs, rubs, gallops. Respiratory: Normal respiratory effort without tachypnea or retractions. Lungs CTAB. Good air entry to the bases with no decreased or absent breath sounds. Musculoskeletal: Full range of motion to all extremities.  Neurologic:  No gross focal neurologic deficits are appreciated.  Cranial nerves II through XII grossly intact.  Negative Romberg's and pronator drift.  Equal grip strength in the upper extremities. Skin:   No rash noted Other:   ED Results / Procedures / Treatments   Labs (all labs ordered are listed, but only abnormal results are displayed) Labs Reviewed  BASIC METABOLIC PANEL - Abnormal; Notable for the following components:      Result Value   Glucose, Bld 102 (*)    All other components within normal limits  URINALYSIS, ROUTINE W REFLEX MICROSCOPIC - Abnormal; Notable for the following components:   Color, Urine YELLOW (*)    APPearance HAZY (*)    Leukocytes,Ua TRACE (*)    Bacteria, UA FEW (*)    All other components within normal limits  CBC  APTT  PROTIME-INR  HEPARIN LEVEL (UNFRACTIONATED)  CBC  CBG MONITORING, ED  POC URINE PREG, ED      EKG     RADIOLOGY  I personally viewed and evaluated these images as part of my medical decision making, as well as reviewing the written report by the radiologist.  I discussed the results with neurology as well.  ED Provider Interpretation: CT scan of the head without contrast revealed no acute intracranial abnormality.  MRI brain revealed no evidence of infarct or ischemia but did have findings consistent with a thrombus in the venous sinus on the right side.  CT venogram reveals a confirmed transverse venous sinus thrombosis.  CT HEAD WO CONTRAST  Result Date: 07/02/2021 CLINICAL DATA:  Dizziness, non-specific EXAM: CT HEAD WITHOUT CONTRAST TECHNIQUE: Contiguous axial images were obtained from the base of the skull through the vertex without intravenous contrast. RADIATION DOSE REDUCTION: This exam was performed according to the departmental dose-optimization program which includes automated exposure control, adjustment of the mA and/or kV according to patient size and/or use of iterative reconstruction technique. COMPARISON:  None. FINDINGS: Brain: There is no acute intracranial hemorrhage, mass effect, or edema. Gray-white differentiation is preserved. There is no extra-axial fluid collection. Ventricles and sulci are within normal limits in size and configuration. Vascular: No hyperdense vessel or unexpected calcification. Skull: Calvarium is unremarkable. Sinuses/Orbits: No acute finding. Other: None. IMPRESSION: No acute intracranial abnormality. Electronically Signed   By: Macy Mis M.D.   On: 07/02/2021 14:53   MR BRAIN W WO CONTRAST  Result Date: 07/02/2021 CLINICAL DATA:  Provided history: CVA/TIA symptoms. History of microprolactinoma. Additional history provided: Patient reports dizziness and bilateral leg weakness since 9 a.m. History of microprolactinoma with surgery in 2013. EXAM: MRI HEAD WITHOUT AND WITH CONTRAST TECHNIQUE: Multiplanar, multiecho pulse sequences of the  brain and surrounding structures were obtained without and with intravenous contrast. CONTRAST:  71m GADAVIST GADOBUTROL 1 MMOL/ML IV SOLN COMPARISON:  Head CT 07/02/2021. FINDINGS: Mild intermittent motion degradation. Brain: Dedicated pituitary imaging reveals a partially empty sella turcica. No focal pituitary lesion is identified. The suprasellar cistern is patent. The pituitary stalk is midline and is not abnormally thickened. Cerebral volume is normal. No cortical encephalomalacia is identified. There is no acute infarct. No chronic intracranial blood products. No extra-axial fluid collection. No midline shift. Vascular: Maintained flow voids within the proximal large arterial vessels. Possible small developmental venous anomaly within the periventricular right frontal lobe (anatomic variant). Curvilinear focus of hypoenhancement within the distal transverse and proximal sigmoid dural venous sinuses on the right (for instance as seen on series 26, image 37) (series 100, image 169). This may reflect flow artifact or a small nonocclusive thrombus. Skull and upper cervical spine: No focal suspicious marrow lesion. Susceptibility artifact arising from cervical  spinal fusion hardware. Incompletely assessed cervical spondylosis. Sinuses/Orbits: Visualized orbits show no acute finding. Small mucous retention cyst within the left maxillary sinus. Trace mucosal thickening within the bilateral ethmoid sinuses. Impression #3 was called by telephone at the time of interpretation on 07/02/2021 at 5:38 pm to provider Dr. Ellender Hose, who verbally acknowledged these results. IMPRESSION: 1. No evidence of acute infarct. 2. No focal pituitary lesion is appreciated. 3. Curvilinear focus of hypoenhancement within the right transverse and sigmoid dural venous sinuses, which may reflect flow artifact or a small non-occlusive thrombus. CT venography of the head may be helpful for further characterization. 4. Partially empty sella turcica.  While this finding often reflects incidental anatomic variation, it can also be associated with idiopathic intracranial hypertension (pseudotumor cerebri). 5. Otherwise unremarkable MRI appearance of the brain. 6. Mild paranasal sinus disease, as described. 7. Incompletely assessed cervical spondylosis and postsurgical changes. Electronically Signed   By: Kellie Simmering D.O.   On: 07/02/2021 17:40   CT VENOGRAM HEAD  Result Date: 07/02/2021 CLINICAL DATA:  CVA/TIA symptoms. Dizziness and bilateral leg weakness. Possible dural venous thrombosis on MRI today. EXAM: CT VENOGRAM HEAD TECHNIQUE: Venographic phase images of the brain were obtained following the administration of intravenous contrast. Multiplanar reformats and maximum intensity projections were generated. RADIATION DOSE REDUCTION: This exam was performed according to the departmental dose-optimization program which includes automated exposure control, adjustment of the mA and/or kV according to patient size and/or use of iterative reconstruction technique. CONTRAST:  30m OMNIPAQUE IOHEXOL 350 MG/ML SOLN COMPARISON:  MRI head with contrast 07/02/2021.  CT head 07/02/2021 FINDINGS: Head CT not repeated. Postcontrast imaging of the brain reveals no enhancing lesion identified. Right transverse sinus is dominant. There is an irregular filling defect in the right transverse sinus as noted on MRI. This is most consistent with thrombus and could be acute or chronic. Hypoplastic left transverse sinus without filling defect. Superior sagittal sinus is patent. IMPRESSION: Filling defect right transverse sinus approximally 2.5 cm in length compatible with thrombus. This could be acute or chronic. This correlates with the MRI earlier today. Electronically Signed   By: CFranchot GalloM.D.   On: 07/02/2021 19:41    PROCEDURES:  Critical Care performed: Yes, see critical care procedure note(s)  .Critical Care Performed by: CDarletta Moll PA-C Authorized  by: CDarletta Moll PA-C   Critical care provider statement:    Critical care time (minutes):  78   Critical care time was exclusive of:  Separately billable procedures and treating other patients and teaching time   Critical care was necessary to treat or prevent imminent or life-threatening deterioration of the following conditions:  CNS failure or compromise   Critical care was time spent personally by me on the following activities:  Obtaining history from patient or surrogate, examination of patient, discussions with consultants, development of treatment plan with patient or surrogate, ordering and performing treatments and interventions, ordering and review of laboratory studies, ordering and review of radiographic studies, re-evaluation of patient's condition and review of old charts   I assumed direction of critical care for this patient from another provider in my specialty: no     Care discussed with: admitting provider     MEDICATIONS ORDERED IN ED: Medications  heparin ADULT infusion 100 units/mL (25000 units/2537m (1,300 Units/hr Intravenous New Bag/Given 07/02/21 2211)  gadobutrol (GADAVIST) 1 MMOL/ML injection 9 mL (9 mLs Intravenous Contrast Given 07/02/21 1715)  iohexol (OMNIPAQUE) 350 MG/ML injection 75 mL (75 mLs Intravenous Contrast  Given 07/02/21 1902)  heparin bolus via infusion 4,700 Units (4,700 Units Intravenous Bolus from Bag 07/02/21 2211)     IMPRESSION / MDM / ASSESSMENT AND PLAN / ED COURSE  I reviewed the triage vital signs and the nursing notes.                              Differential diagnosis includes, but is not limited to, CVA, TIA, migraine, hypoglycemia   Patient's diagnosis is consistent with transverse venous sinus thrombosis.  Patient presented to the emergency department complaining of difficulty with word simulation.  Patient states that this morning she had a near syncopal feeling where she felt like she was hypoglycemic.  She is a diabetic so  she ate some candy to have a rush of sugar.  She states that this did not improve her symptoms.  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  She cannot go out how to spell the words or what supports she needed to say.  She states that when she was trying to talk to coworkers she had the same difficulty.  She had no headache, visual changes, unilateral weakness.  The symptoms lasted roughly 6 hours and then resolved as quickly as they had started.  No personal history of any coagulopathies, no history of DVT, PE, CVA.  Familial history of strokes.  Initial work-up was reassuring with a CT scan of the head revealing no intracranial hemorrhage.  Given her history of microprolactinoma I felt that the patient would warrant an MRI given her symptoms and her medical history.  MRI of the brain revealed no evidence of infarct but did have findings concerning for a possible sinus thrombosis.  I discussed the patient with Dr. Rory Percy with neurology who advised to proceed with a CT venogram and admit regardless of the result.  CT venogram did reveal a right-sided transverse venous sinus thrombosis.  After resolution of symptoms for approximately 30 minutes prior to arrival patient has had no return of symptoms.  Currently she is neurologically intact with no obvious deficits on exam.  I discussed the patient with the on-call teleneurologist and given the findings they do recommend heparinizing the patient at this time.  Patient will be started on heparin, admitted to the hospital service at this time.  Neurology will follow the patient while admitted..     Clinical Course as of 07/02/21 [place] Jul 02, 2021  1600 Patient presented to the emergency department with CVA/TIA-like symptoms.  She had difficulty formulating words, difficulty completing typical tasks and was feeling dizzy and near syncopal.  Patient is back to her baseline at this time.   Neurologically intact.  Patient's labs are reassuring.  CT scan of the head was reassuring with no acute intracranial hemorrhage.  She does have a history of pituitary tumor/micro prolactinoma.  She is on medications for same but does not always take the medications as prescribed.  Given the symptoms, I feel that the patient would warrant imaging with MRI.  This is ordered at this time. [JC]  1845 CT venogram returns with evidence of dural sinus thrombosis.  At this time I reached out to teleneurology who will evaluate the patient and give recommendations whether we should start anticoagulation/heparin at this time. [JC]    Clinical Course User Index [JC] Ketura Sirek, Charline Bills, PA-C     FINAL CLINICAL IMPRESSION(S) / ED  DIAGNOSES   Final diagnoses:  Acute cerebral venous sinus thrombosis     Rx / DC Orders   ED Discharge Orders     None        Note:  This document was prepared using Dragon voice recognition software and may include unintentional dictation errors.   Brynda Peon 07/02/21 2235    Duffy Bruce, MD 07/10/21 [phone]

## 2021-07-02 NOTE — Assessment & Plan Note (Signed)
On cabergoline twice weekly

## 2021-07-02 NOTE — Assessment & Plan Note (Signed)
Nicotine patch if agreeable

## 2021-07-02 NOTE — Assessment & Plan Note (Addendum)
Echocardiogram and cardiac monitoring to evaluate for arrhythmia

## 2021-07-02 NOTE — ED Triage Notes (Signed)
Pt here with dizziness that started this morning while she was at work. Pt states that she was unable to preform things at work that she normally can. Pt states she feels a weird sensation down both her legs similar to numbness. Pt LKW was 0940. Pt in NAD in triage.

## 2021-07-02 NOTE — Progress Notes (Signed)
ANTICOAGULATION CONSULT NOTE  Pharmacy Consult for heparin Indication:  venous sinus thrombosis  Allergies  Allergen Reactions   Codeine Other (See Comments)    Cough syrup with codeine caused hyperactivity    Patient Measurements: Height: 5' 6"  (167.6 cm) Weight: 89.8 kg (197 lb 15.6 oz) IBW/kg (Calculated) : 59.3 Heparin Dosing Weight: 78.8 kg  Vital Signs: Temp: 98.5 F (36.9 C) (02/07 1415) Temp Source: Oral (02/07 1415) BP: 127/73 (02/07 2000) Pulse Rate: 70 (02/07 2000)  Labs: Recent Labs    07/02/21 1417  HGB 13.0  HCT 39.5  PLT 354  CREATININE 0.67    Estimated Creatinine Clearance: 96 mL/min (by C-G formula based on SCr of 0.67 mg/dL).   Medical History: Past Medical History:  Diagnosis Date   Arthritis    Diabetes mellitus without complication ([place])    Fibromyalgia    H/O cardiac radiofrequency ablation    Hypertension    Microprolactinoma [facility])     Assessment: 51 year old female presented with dizziness and inability to perform tasks at work that she is normally able to do. Concern for stroke, seen by neurologist. CTV positive for venous sinus thrombosis, acute vs chronic. Pharmacy consulted for heparin management. She is not on any anticoagulation PTA.  Goal of Therapy:  Heparin level 0.3-0.7 units/ml Monitor platelets by anticoagulation protocol: Yes   Plan:  --Heparin 4700 unit bolus --Heparin infusion at 1300 units/hr --Check HL 2/8 at 0400 --CBC daily while on heparin infusion  Tawnya Crook, PharmD, BCPS Clinical Pharmacist 07/02/2021 9:24 PM

## 2021-07-02 NOTE — H&P (Signed)
History and Physical    Patient: Breanna Ramos PVX:[id number] DOB: [birth year] DOA: 07/02/2021 DOS: the patient was seen and examined on 07/03/2021 PCP: Gladstone Lighter, MD  Patient coming from: Home  Chief Complaint:  Chief Complaint  Patient presents with   Dizziness    HPI: Breanna Ramos is a 51 y.o. 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  Thinking she might be hypoglycemic she had some candy but symptoms did not resolve and she presented to the ED.  She denies recent illness and was in her usual state of health until the onset of symptoms at 0930, completely resolving 6 hours later, prior to evaluation in the ED  ED course: BP 133/91 with otherwise normal vitals Blood work: CBC and BMP unremarkable Urinalysis unremarkable  EKG, personally viewed and interpreted: NSR at 94 with no acute ST-T wave changes  Imaging: MRI brain no evidence of acute infarct, no focal pituitary lesion appreciated, abnormality right transverse sigmoid dural venous sinus with recommendation for CT venography CT venogram: Filling defect right transverse sinus approximately 2.5 cm in length compatible with thrombus.  This could be acute or chronic  Teleneurology consulted from the ED with recommendation for heparin drip, overnight observation and hypercoagulable work-up with factor V Leiden, protein C&S, antiphospholipid antibodies and TSH Patient initiated on a  heparin drip.  Hospitalist consulted for admission.   Review of Systems: As mentioned in the history of present illness. All other systems reviewed and are negative. Past Medical History:  Diagnosis Date   Arthritis    Diabetes mellitus without complication (HCC)    Fibromyalgia    H/O cardiac radiofrequency ablation    Hypertension    Microprolactinoma ([place])    Past Surgical History:  Procedure Laterality Date   BACK SURGERY     x 2   CARDIAC ELECTROPHYSIOLOGY STUDY AND ABLATION     CHOLECYSTECTOMY     COLONOSCOPY WITH PROPOFOL N/A 03/07/2020   Procedure: COLONOSCOPY WITH PROPOFOL;  Surgeon: Lin Landsman, MD;  Location: [facility] ENDOSCOPY;  Service: Gastroenterology;  Laterality: N/A;   FRACTURE SURGERY     Left Ankle   Social History:  reports that she has been smoking. She has been smoking an average of .25 packs per day. She has never used smokeless tobacco. She reports current alcohol use. She reports that she does not use drugs.  Allergies  Allergen Reactions   Codeine Other (See Comments)    Cough syrup with codeine caused hyperactivity    Family History  Problem Relation Age of Onset   Stroke Mother    Diabetes Mother    Heart disease Mother    Breast cancer Mother 25   Stroke Father     Prior to Admission medications   Medication Sig Start Date End Date Taking? Authorizing Provider  ascorbic acid (VITAMIN C) 500 MG tablet Take 500 mg by mouth daily.     [provider]  bisoprolol (ZEBETA) 5 MG tablet Take 2.5 mg by mouth daily.  12/19/19   [provider]  buPROPion (  WELLBUTRIN XL) 300 MG 24 hr tablet Take 300 mg by mouth daily.  02/07/20 02/06/21  [provider]  cabergoline (DOSTINEX) 0.5 MG tablet Take 0.5 mg by mouth 2 (two) times a week. Tuesday/Saturday 09/18/19   [provider]  celecoxib (CELEBREX) 200 MG capsule Take 200 mg by mouth at bedtime.     [provider]  Cholecalciferol 50 MCG (2000 UT) CAPS  Take 2,000 Units by mouth daily.     [provider]  cyclobenzaprine (FLEXERIL) 10 MG tablet Take 10 mg by mouth 2 (two) times daily.  03/01/16   [provider]  diltiazem (TIAZAC) 240 MG 24 hr capsule Take 240 mg by mouth daily.     [provider]  Docusate Sodium (DSS) 100 MG CAPS docusate sodium 100 mg capsule    [provider]  FLUoxetine (PROZAC) 40 MG capsule Take 40 mg by mouth daily.    [provider]  gabapentin (NEURONTIN) 300 MG capsule Take 300 mg by mouth daily.  09/26/19   [provider]  loratadine (CLARITIN) 10 MG tablet Take 10 mg by mouth daily.     [provider]  LORazepam (ATIVAN) 1 MG tablet Take 1 mg by mouth daily as needed for anxiety.     [provider]  lubiprostone (AMITIZA) 8 MCG capsule lubiprostone 8 mcg capsule  TAKE 1 CAPSULE (8 MCG TOTAL) BY MOUTH 2 (TWO) TIMES DAILY WITH MEALS FOR 30 DAYS    [provider]  Magnesium Oxide 500 MG TABS Take 1 tablet by mouth daily.    [provider]  Melatonin 5 MG CAPS Take 5 mg by mouth at bedtime.     [provider]  mesalamine (CANASA) 1000 MG suppository Place 1 suppository (1,000 mg total) rectally at bedtime. 03/12/20   Lin Landsman, MD  metFORMIN (GLUCOPHAGE) 500 MG tablet Take 1,000 mg by mouth 2 (two) times daily with a meal.    [provider]  Multiple Vitamin (MULTI-VITAMIN) tablet Take 1 tablet by mouth daily.    [provider]  ondansetron (ZOFRAN) 4 MG tablet Take 4 mg by mouth every 8 (eight) hours as needed for nausea or vomiting.    [provider]  oxyCODONE-acetaminophen (PERCOCET) 10-325 MG tablet Take 1 tablet by mouth every 6 (six) hours as needed for pain.  03/02/16   [provider]  pantoprazole (PROTONIX) 40 MG tablet Take 1 tablet (40 mg total) by mouth 2 (two) times daily before a meal. 09/13/20 09/13/21  Lin Landsman, MD    Physical Exam: Vitals:    07/02/21 1800 07/02/21 1830 07/02/21 1950 07/02/21 2000  BP: 137/88 (!) 131/94 (!) 133/91 127/73  Pulse: 74 72 72 70  Resp:  18 19 19   Temp:      TempSrc:      SpO2: 98% 97% 97% 96%  Weight:      Height:       Physical Exam Vitals and nursing note reviewed.  Constitutional:      General: She is not in acute distress.    Appearance: Normal appearance.  HENT:     Head: Normocephalic and atraumatic.  Cardiovascular:     Rate and Rhythm: Normal rate and regular rhythm.     Pulses: Normal pulses.     Heart sounds: Normal heart sounds. No murmur heard. Pulmonary:     Effort: Pulmonary effort is normal.     Breath sounds: Normal breath sounds. No wheezing or  rhonchi.  Abdominal:     General: Bowel sounds are normal.     Palpations: Abdomen is soft.     Tenderness: There is no abdominal tenderness.  Musculoskeletal:        General: No swelling or tenderness. Normal range of motion.     Cervical back: Normal range of motion and neck supple.  Skin:    General: Skin is warm and dry.  Neurological:     General: No focal deficit present.     Mental Status: She is alert. Mental status is at baseline.  Psychiatric:        Mood and Affect: Mood normal.        Behavior: Behavior normal.     Data Reviewed: Notes from primary care and specialist visits, past discharge summaries. Prior diagnostic testing as applicable to current admission diagnoses Updated medications and problem lists for reconciliation ED course, including vitals, labs, imaging, treatment and response to treatment Triage notes and ED providers notes   Assessment and Plan: * Acute focal neurologic deficit with complete resolution Etiology possibly related to cerebral venous thrombosis, possible TIA MRI negative for stroke Neurologic checks Stroke work-up: Echocardiogram, carotid Doppler, continuous cardiac monitoring No antiplatelets as patient will be on systemic anticoagulation. Lipid profile  ordered  Cerebral venous sinus thrombosis CT venogram filling defect right transverse sinus approximally 2.5 cm in length compatible with thrombus. This could be acute or chronic Heparin infusion Hypercoagulability work-up, with factor V Leiden, protein C&S, APL and TSH Consider hematology consult Neurology to follow  History of rectal bleeding, 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 08/2020 Monitor for bleeding in the setting of heparin infusion  Chronic, continuous use of opioids Management as above  Nicotine dependence, cigarettes, uncomplicated Nicotine patch if agreeable  OSA on CPAP CPAP nightly if desired  H/O cardiac radiofrequency ablation for PSVT 2018 Echocardiogram and cardiac monitoring to evaluate for arrhythmia  Depression- (present on admission) Continue fluoxetine and bupropion  Cervical post-laminectomy syndrome- (present on admission) Chronic pain Continue opiates and gabapentin  Essential hypertension- (present on admission) Continue diltiazem and bisoprolol  Microprolactinoma diagnosed 2016 ([place])- (present on admission) On cabergoline twice weekly  Diabetes mellitus without complication ([place]) Sliding scale insulin coverage     Advance Care Planning:   Code Status: Full Code   Consults: neurology  Family Communication: none  Severity of Illness: The appropriate patient status for this patient is OBSERVATION. Observation status is judged to be reasonable and necessary in order to provide the required intensity of service to ensure the patient's safety. The patient's presenting symptoms, physical exam findings, and initial radiographic and laboratory data in the context of their medical condition is felt to place them at decreased risk for further clinical deterioration. Furthermore, it is anticipated that the patient will be medically stable for discharge from the hospital within 2 midnights of admission.   Author: Athena Masse, MD 07/03/2021 1:41  AM  For on call review [email or web address].

## 2021-07-02 NOTE — Assessment & Plan Note (Addendum)
Etiology possibly related to cerebral venous thrombosis, possible TIA MRI negative for stroke Neurologic checks Stroke work-up: Echocardiogram, carotid Doppler, continuous cardiac monitoring No antiplatelets as patient will be on systemic anticoagulation. Lipid profile ordered

## 2021-07-02 NOTE — Assessment & Plan Note (Signed)
Continue fluoxetine and bupropion

## 2021-07-02 NOTE — Assessment & Plan Note (Signed)
Sliding scale insulin coverage 

## 2021-07-02 NOTE — Assessment & Plan Note (Signed)
Management as above

## 2021-07-03 ENCOUNTER — Encounter: Payer: Self-pay | Admitting: Internal Medicine

## 2021-07-03 ENCOUNTER — Observation Stay: Payer: BLUE CROSS/BLUE SHIELD

## 2021-07-03 ENCOUNTER — Observation Stay (HOSPITAL_BASED_OUTPATIENT_CLINIC_OR_DEPARTMENT_OTHER)
Admit: 2021-07-03 | Discharge: 2021-07-03 | Disposition: A | Discharge status: Home / Self Care | Payer: BLUE CROSS/BLUE SHIELD | Attending: Internal Medicine | Admitting: Internal Medicine

## 2021-07-03 DIAGNOSIS — G08 Intracranial and intraspinal phlebitis and thrombophlebitis: Secondary | ICD-10-CM

## 2021-07-03 DIAGNOSIS — Z8719 Personal history of other diseases of the digestive system: Secondary | ICD-10-CM

## 2021-07-03 DIAGNOSIS — Z87898 Personal history of other specified conditions: Secondary | ICD-10-CM | POA: Diagnosis not present

## 2021-07-03 DIAGNOSIS — G459 Transient cerebral ischemic attack, unspecified: Secondary | ICD-10-CM

## 2021-07-03 LAB — ECHOCARDIOGRAM COMPLETE
AR max vel: 2.77 cm2
AV Area VTI: 3.52 cm2
AV Area mean vel: 2.56 cm2
AV Mean grad: 3 mmHg
AV Peak grad: 5.7 mmHg
Ao pk vel: 1.19 m/s
Area-P 1/2: 3.95 cm2
Height: 66 in
MV VTI: 3.46 cm2
S' Lateral: 2.85 cm
Weight: 3280.44 oz

## 2021-07-03 LAB — CBC
HCT: 41.2 % (ref 36.0–46.0)
Hemoglobin: 13.5 g/dL (ref 12.0–15.0)
MCH: 30.1 pg (ref 26.0–34.0)
MCHC: 32.8 g/dL (ref 30.0–36.0)
MCV: 92 fL (ref 80.0–100.0)
Platelets: 316 10*3/uL (ref 150–400)
RBC: 4.48 MIL/uL (ref 3.87–5.11)
RDW: 13 % (ref 11.5–15.5)
WBC: 6.7 10*3/uL (ref 4.0–10.5)
nRBC: 0 % (ref 0.0–0.2)

## 2021-07-03 LAB — GLUCOSE, CAPILLARY
Glucose-Capillary: 100 mg/dL — ABNORMAL HIGH (ref 70–99)
Glucose-Capillary: 163 mg/dL — ABNORMAL HIGH (ref 70–99)
Glucose-Capillary: 91 mg/dL (ref 70–99)
Glucose-Capillary: 121 mg/dL — ABNORMAL HIGH (ref 70–99)

## 2021-07-03 LAB — HIV ANTIBODY (ROUTINE TESTING W REFLEX): HIV Screen 4th Generation wRfx: NONREACTIVE

## 2021-07-03 LAB — HEMOGLOBIN A1C
Hgb A1c MFr Bld: 5.1 % (ref 4.8–5.6)
Mean Plasma Glucose: 99.67 mg/dL

## 2021-07-03 LAB — LIPID PANEL
Cholesterol: 208 mg/dL — ABNORMAL HIGH (ref 0–200)
Cholesterol: 210 mg/dL — ABNORMAL HIGH (ref 0–200)
HDL: 60 mg/dL (ref >40)
HDL: 60 mg/dL (ref >40)
LDL Cholesterol: 131 mg/dL — ABNORMAL HIGH (ref 0–99)
LDL Cholesterol: 137 mg/dL — ABNORMAL HIGH (ref 0–99)
Total CHOL/HDL Ratio: 3.5 RATIO
Total CHOL/HDL Ratio: 3.5 RATIO
Triglycerides: 66 mg/dL (ref <150)
Triglycerides: 85 mg/dL (ref <150)
VLDL: 13 mg/dL (ref 0–40)
VLDL: 17 mg/dL (ref 0–40)

## 2021-07-03 LAB — RESP PANEL BY RT-PCR (FLU A&B, COVID) ARPGX2
Influenza A by PCR: NEGATIVE
Influenza B by PCR: NEGATIVE
SARS Coronavirus 2 by RT PCR: NEGATIVE

## 2021-07-03 LAB — TSH: TSH: 2.121 u[IU]/mL (ref 0.350–4.500)

## 2021-07-03 LAB — HEPARIN LEVEL (UNFRACTIONATED): Heparin Unfractionated: 0.26 IU/mL — ABNORMAL LOW (ref 0.30–0.70)

## 2021-07-03 MED ORDER — VENLAFAXINE HCL ER 75 MG PO CP24
75.0000 mg | ORAL_CAPSULE | Freq: Every day | ORAL | Status: DC
  Administered 2021-07-03 – 2021-07-04 (×2): 75 mg via ORAL
  Filled 2021-07-03 (×2): qty 1

## 2021-07-03 MED ORDER — CYCLOBENZAPRINE HCL 10 MG PO TABS
10.0000 mg | ORAL_TABLET | Freq: Three times a day (TID) | ORAL | Status: DC | PRN
  Administered 2021-07-03 (×2): 10 mg via ORAL
  Filled 2021-07-03 (×2): qty 1

## 2021-07-03 MED ORDER — MESALAMINE 1000 MG RE SUPP
1000.0000 mg | Freq: Every evening | RECTAL | Status: DC | PRN
  Filled 2021-07-03: qty 1

## 2021-07-03 MED ORDER — OXYCODONE-ACETAMINOPHEN 5-325 MG PO TABS
2.0000 | ORAL_TABLET | Freq: Four times a day (QID) | ORAL | Status: DC | PRN
  Administered 2021-07-03 – 2021-07-04 (×3): 2 via ORAL
  Filled 2021-07-03 (×3): qty 2

## 2021-07-03 MED ORDER — APIXABAN 5 MG PO TABS: 5.0000 mg | ORAL_TABLET | Freq: Two times a day (BID) | ORAL | Status: DC

## 2021-07-03 MED ORDER — DOCUSATE SODIUM 100 MG PO CAPS
100.0000 mg | ORAL_CAPSULE | Freq: Every day | ORAL | Status: DC | PRN
  Administered 2021-07-03: 100 mg via ORAL
  Filled 2021-07-03: qty 1

## 2021-07-03 MED ORDER — APIXABAN 5 MG PO TABS
10.0000 mg | ORAL_TABLET | Freq: Two times a day (BID) | ORAL | Status: DC
  Administered 2021-07-03 – 2021-07-04 (×3): 10 mg via ORAL
  Filled 2021-07-03 (×3): qty 2

## 2021-07-03 MED ORDER — MAGNESIUM OXIDE -MG SUPPLEMENT 400 (240 MG) MG PO TABS
400.0000 mg | ORAL_TABLET | Freq: Every day | ORAL | Status: DC
  Administered 2021-07-03 – 2021-07-04 (×2): 400 mg via ORAL
  Filled 2021-07-03 (×2): qty 1

## 2021-07-03 MED ORDER — MELATONIN 5 MG PO TABS
5.0000 mg | ORAL_TABLET | Freq: Every day | ORAL | Status: DC
  Administered 2021-07-03: 5 mg via ORAL
  Filled 2021-07-03: qty 1

## 2021-07-03 MED ORDER — OXYCODONE-ACETAMINOPHEN 5-325 MG PO TABS
1.0000 | ORAL_TABLET | Freq: Once | ORAL | Status: AC
  Administered 2021-07-03: 1 via ORAL
  Filled 2021-07-03: qty 1

## 2021-07-03 MED ORDER — ADULT MULTIVITAMIN W/MINERALS CH
1.0000 | ORAL_TABLET | Freq: Every day | ORAL | Status: DC
  Administered 2021-07-03 – 2021-07-04 (×2): 1 via ORAL
  Filled 2021-07-03 (×3): qty 1

## 2021-07-03 MED ORDER — BUPROPION HCL ER (XL) 150 MG PO TB24
300.0000 mg | ORAL_TABLET | Freq: Every day | ORAL | Status: DC
  Administered 2021-07-03 – 2021-07-04 (×2): 300 mg via ORAL
  Filled 2021-07-03 (×2): qty 2

## 2021-07-03 MED ORDER — VITAMIN D 25 MCG (1000 UNIT) PO TABS
2000.0000 [IU] | ORAL_TABLET | Freq: Every day | ORAL | Status: DC
  Administered 2021-07-03 – 2021-07-04 (×2): 2000 [IU] via ORAL
  Filled 2021-07-03 (×2): qty 2

## 2021-07-03 MED ORDER — GABAPENTIN 300 MG PO CAPS
300.0000 mg | ORAL_CAPSULE | Freq: Every day | ORAL | Status: DC
  Administered 2021-07-03 – 2021-07-04 (×2): 300 mg via ORAL
  Filled 2021-07-03 (×2): qty 1

## 2021-07-03 MED ORDER — FLUOXETINE HCL 20 MG PO CAPS
40.0000 mg | ORAL_CAPSULE | Freq: Every day | ORAL | Status: DC
  Administered 2021-07-03 – 2021-07-04 (×2): 40 mg via ORAL
  Filled 2021-07-03 (×2): qty 2

## 2021-07-03 MED ORDER — PANTOPRAZOLE SODIUM 40 MG PO TBEC
40.0000 mg | DELAYED_RELEASE_TABLET | Freq: Two times a day (BID) | ORAL | Status: DC
  Administered 2021-07-03 – 2021-07-04 (×2): 40 mg via ORAL
  Filled 2021-07-03 (×2): qty 1

## 2021-07-03 NOTE — Progress Notes (Signed)
SLP Cancellation Note  Patient Details Name: Raneen Jaffer MRN: [id number] DOB: 09/09/1970   Cancelled treatment:       Reason Eval/Treat Not Completed: SLP screened, no needs identified, will sign off (chart reviewed; consulted pt and family in room, NSG) Pt denied any difficulty swallowing and is currently on a regular diet; tolerates swallowing pills w/ water per NSG. Pt conversed in conversation w/out expressive/receptive deficits noted; pt denied any speech-language deficits. Speech clear. Pt was on her cell phone during session w/ no deficits reported. No further skilled ST services indicated as pt appears at her baseline. Pt agreed. NSG to reconsult if any change in status while admitted.      Orinda Kenner, MS, CCC-SLP Speech Language Pathologist Rehab Services; [place] [phone] (ascom) Ayomide Zuleta 07/03/2021, 12:21 PM

## 2021-07-03 NOTE — Hospital Course (Signed)
Breanna Ramos is a 51 y.o. 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

## 2021-07-03 NOTE — Care Plan (Addendum)
Stroke swallow screen administered.  Pt able to open and close mouth, stick out tongue and smile, without issue.  No coughing or choking noted with 3 ounces of liquid.  Patient passed screening.

## 2021-07-03 NOTE — Evaluation (Signed)
Physical Therapy Evaluation Patient Details Name: Breanna Ramos MRN: [id number] DOB: [birth year] Today's Date: 07/03/2021  History of Present Illness  Rheagan Ramos is a 51 y.o. 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 Imaging negative for acute infarct.  CTV positive for venous sinus thrombosis, acute vs chronic. Heparin infusion initiated 07/02/21. Pharmacy and Neurology following.   Clinical Impression  Patient received in bed, OT present in room. She is agreeable to assessment. Reports no difficulties. Significant other present in room. Patient is independent with all bed mobility and transfers. Ambulated 300 feet with no AD, pushing IV pole. No difficulties or abnormalities noted. Patient has no further PT needs at this time. Will sign off.        Recommendations for follow up therapy are one component of a multi-disciplinary discharge planning process, led by the attending physician.  Recommendations may be updated based on patient status, additional functional criteria and insurance authorization.  Follow Up Recommendations No PT follow up    Assistance Recommended at Discharge None  Patient can return home with the following       Equipment Recommendations    Recommendations for Other Services       Functional Status Assessment Patient has not had a recent decline in their functional status     Precautions / Restrictions Precautions Precautions: None Precaution Comments: Low fall Restrictions Weight Bearing Restrictions: No       Mobility  Bed Mobility Overal bed mobility: Independent Bed Mobility: Supine to Sit     Supine to sit: Independent          Transfers Overall transfer level: Independent Equipment used: None               General transfer comment: Good safety awareness and confidence with mobility.    Ambulation/Gait Ambulation/Gait assistance: Independent Gait Distance (Feet): 300 Feet Assistive device: None, IV Pole Gait Pattern/deviations: WFL(Within Functional Limits) Gait velocity: normal     General Gait Details: normal ambulation  Stairs            Wheelchair Mobility    Modified Rankin (Stroke Patients Only)       Balance Overall balance assessment: Independent                                           Pertinent Vitals/Pain Pain Assessment Pain Assessment: No/denies pain    Home Living Family/patient expects to be discharged to:: Private residence Living Arrangements: Spouse/significant other Available Help at Discharge: Family;Available PRN/intermittently Type of Home: House Home Access: Level entry       Home Layout: One level Home Equipment: None      Prior Function Prior Level of Function : Independent/Modified Independent;Working/employed;Driving                     Hand Dominance   Dominant Hand: Right    Extremity/Trunk Assessment   Upper Extremity Assessment Upper Extremity Assessment: Defer to OT evaluation    Lower Extremity Assessment Lower Extremity Assessment: Overall [facility]  for tasks assessed    Cervical / Trunk Assessment Cervical / Trunk Assessment: Normal  Communication   Communication: No difficulties  Cognition Arousal/Alertness: Awake/alert Behavior During Therapy: WFL for tasks assessed/performed Overall Cognitive Status: Within Functional Limits for tasks assessed                                 General Comments: Pleasant, conversational, A&Ox4.        General  Comments      Exercises     Assessment/Plan    PT Assessment Patient does not need any further PT services  PT Problem List         PT Treatment Interventions      PT Goals (Current goals can be found in the Care Plan section)  Acute Rehab PT Goals Patient Stated Goal: to return home tomorrow PT Goal Formulation: With patient Time For Goal Achievement: 07/04/21 Potential to Achieve Goals: Good    Frequency       Co-evaluation               AM-PAC PT "6 Clicks" Mobility  Outcome Measure Help needed turning from your back to your side while in a flat bed without using bedrails?: None Help needed moving from lying on your back to sitting on the side of a flat bed without using bedrails?: None Help needed moving to and from a bed to a chair (including a wheelchair)?: None Help needed standing up from a chair using your arms (e.g., wheelchair or bedside chair)?: None Help needed to walk in hospital room?: None Help needed climbing 3-5 steps with a railing? : None 6 Click Score: 24    End of Session   Activity Tolerance: Patient tolerated treatment well Patient left: in bed Nurse Communication: Mobility status      Time: 3794-3276 PT Time Calculation (min) (ACUTE ONLY): 11 min   Charges:   PT Evaluation $PT Eval Low Complexity: 1 Low          Latonya Knight, PT, GCS 07/03/21,10:16 AM

## 2021-07-03 NOTE — Consult Note (Signed)
Neurology Consultation  Reason for Consult: Strokelike symptoms, dural venous sinus thrombosis Referring Physician: Dr. Posey Pronto  CC: Speech issues  History is obtained from: Patient, chart  HPI: Breanna Ramos is a 51 y.o. 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  This was followed by trouble getting words out.  Last known well was approximately 945 yesterday morning and symptoms lasted till about 3 PM.  Denied any tingling numbness or weakness.  Her procalcitonin producing tumor has led to lactation as well as menorrhagia.  She has a history of SVT status post ablation. No prior history of similar symptoms. MRI brain was performed which did not reveal any acute changes but there was concern for a filling defect in the right transverse sinus.  This was followed by CT venogram which confirmed the filling defect as a dural venous sinus thrombus.  Telemedicine neurology was consulted overnight who recommended that she started be on be started on heparin IV which she was.  Hypercoagulable panel was also recommended unfortunately it was sent out after the heparin had been on for a few hours. Denies any chest pain shortness of breath.   LKW: 9:45 AM on 07/02/2021 tpa given?: no, was outside the window at the time of presentation as well as symptoms are completely resolved Premorbid modified Rankin scale (mRS): 0   ROS: Full ROS was performed and is negative except as noted in the HPI.   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 She has been smoking an average of .25 packs per day. She has never used smokeless tobacco. She reports current alcohol use. She reports that she does not use drugs.  Current smoker  Medications  Current Facility-Administered Medications:     stroke: mapping our early stages of recovery book, , Does not apply, Once, Athena Masse, MD   acetaminophen (TYLENOL) tablet 650 mg, 650 mg, Oral, Q4H PRN **OR** acetaminophen (TYLENOL) 160 MG/5ML solution 650 mg, 650 mg, Per Tube, Q4H PRN **OR** acetaminophen (TYLENOL) suppository 650 mg, 650 mg, Rectal, Q4H PRN, Athena Masse, MD   buPROPion (WELLBUTRIN XL) 24 hr tablet 300 mg, 300 mg, Oral, Daily, Fritzi Mandes, MD   Cholecalciferol CAPS 2,000 Units, 2,000 Units, Oral, Daily, Fritzi Mandes, MD   cyclobenzaprine (FLEXERIL) tablet 10 mg, 10 mg, Oral, TID PRN, Fritzi Mandes, MD   FLUoxetine (PROZAC) capsule 40 mg, 40 mg, Oral, Daily, Fritzi Mandes, MD   gabapentin (NEURONTIN) capsule 300 mg, 300 mg, Oral, Daily, Fritzi Mandes, MD   heparin ADULT infusion 100 units/mL (25000 units/267m), 1,450 Units/hr, Intravenous, Continuous, POswald Hillock RPH, Last Rate: 14.5 mL/hr at 07/03/21 0848, 1,450 Units/hr at 07/03/21 0848   insulin aspart (novoLOG) injection 0-15 Units, 0-15 Units, Subcutaneous, TID WC, DDamita Dunnings Hazel V, MD   insulin aspart (novoLOG) injection 0-5 Units, 0-5 Units, Subcutaneous, QHS, DAthena Masse MD   Magnesium  Oxide TABS 500 mg, 1 tablet, Oral, Daily, Fritzi Mandes, MD   Melatonin CAPS 5 mg, 5 mg, Oral, QHS, Fritzi Mandes, MD   mesalamine (CANASA) suppository 1,000 mg, 1,000 mg, Rectal, QHS PRN, Fritzi Mandes, MD   Multi-Vitamin 1 tablet, 1 tablet, Oral, Daily, Fritzi Mandes, MD   oxyCODONE-acetaminophen (PERCOCET) 10-325 MG per tablet 1 tablet, 1 tablet, Oral, Q6H PRN, Fritzi Mandes, MD   pantoprazole (PROTONIX) EC tablet 40 mg, 40 mg, Oral, BID AC, Fritzi Mandes,  MD   venlafaxine XR (EFFEXOR-XR) 24 hr capsule 75 mg, 75 mg, Oral, Daily, Fritzi Mandes, MD   Exam: Current vital signs: BP 116/87 (BP Location: Right Arm)    Pulse 93    Temp 98.3 F (36.8 C) (Oral)    Resp 18    Ht 5' 6"  (1.676 m)    Wt 93 kg    SpO2 97%    BMI 33.09 kg/m  Vital signs in last 24 hours: Temp:  [97.7 F (36.5 C)-98.5 F (36.9 C)] 98.3 F (36.8 C) (02/08 0723) Pulse Rate:  [69-95] 93 (02/08 0723) Resp:  [18-19] 18 (02/08 0723) BP: (116-139)/(73-94) 116/87 (02/08 0723) SpO2:  [91 %-99 %] 97 % (02/08 0723) Weight:  [89.8 kg-93 kg] 93 kg (02/08 0429)  GENERAL: Awake, alert in NAD HEENT: - Normocephalic and atraumatic, dry mm, no LN++, no Thyromegally LUNGS - Clear to auscultation bilaterally with no wheezes CV - S1S2 RRR, no m/r/g, equal pulses bilaterally. ABDOMEN - Soft, nontender, nondistended with normoactive BS Ext: warm, well perfused, intact peripheral pulses, __ edema  NEURO:  Mental Status: AA&Ox3  Language: speech is normal without dysarthria  Naming, repetition, fluency, and comprehension intact. Cranial Nerves: PERRL EOMI, visual fields full, no facial asymmetry, facial sensation intact, hearing intact, tongue/uvula/soft palate midline, normal sternocleidomastoid and trapezius muscle strength. No evidence of tongue atrophy or fibrillations Motor: 5/5 without Tone: is normal and bulk is normal Sensation- Intact to light touch bilaterally Coordination: FTN intact bilaterally, no ataxia in BLE. Gait- deferred  NIHSS0   Labs I have reviewed labs in epic and the results pertinent to this consultation are: 0  CBC    Component Value Date/Time   WBC 6.7 07/03/2021 0835   RBC 4.48 07/03/2021 0835   HGB 13.5 07/03/2021 0835   HGB 14.1 02/17/2020 1026   HCT 41.2 07/03/2021 0835   HCT 41.6 02/17/2020 1026   PLT 316 07/03/2021 0835   PLT 406 02/17/2020 1026   MCV 92.0 07/03/2021 0835   MCV 88 02/17/2020 1026   MCH 30.1 07/03/2021 0835   MCHC 32.8  07/03/2021 0835   RDW 13.0 07/03/2021 0835   RDW 12.9 02/17/2020 1026    CMP     Component Value Date/Time   NA 136 07/02/2021 1417   K 3.9 07/02/2021 1417   CL 101 07/02/2021 1417   CO2 28 07/02/2021 1417   GLUCOSE 102 (H) 07/02/2021 1417   BUN 18 07/02/2021 1417   CREATININE 0.67 07/02/2021 1417   CALCIUM 9.2 07/02/2021 1417   PROT 7.0 02/13/2020 1617   ALBUMIN 4.0 02/13/2020 1617   AST 20 02/13/2020 1617   ALT 25 02/13/2020 1617   ALKPHOS 80 02/13/2020 1617   BILITOT 0.6 02/13/2020 1617   GFRNONAA >60 07/02/2021 1417   GFRAA >60 02/15/2020 0718    Lipid Panel  No results found for: CHOL, TRIG, HDL, CHOLHDL, VLDL, LDLCALC, LDLDIRECT   Imaging I have reviewed the images obtained:  CT-head-no acute changes MRI of the  brain-no evidence of acute stroke.  Concern for abnormal signal in the right transverse sinus. CT venogram with filling defect in the right transverse sinus consistent with dural venous sinus thrombus   Assessment:  51 year old with above past medical history presenting for a 5 to 6-hour episode of difficulty with word finding and difficulty with comprehension without any motor or sensory deficits, on imaging noted to have a right transverse dural venous sinus thrombus. I am not sure how her presenting symptoms fit with the venous sinus thrombus-which could have been subacute as well.  Presenting symptoms most likely left hemispheric TIA.  ABCD2 score 4  Currently on heparin drip for the dural venous sinus thrombus.  Impression: TIA Cerebral venous sinus thrombosis  Recommendations: Full stroke work-up Lipid panel pending A1c pending Echocardiogram ordered-pending PT OT speech therapy Currently on heparin drip.  Transition to Eliquis p.o.  Watch overnight and potentially discharge tomorrow after completion of the stroke work-up. I discussed in detail the importance of smoking cessation which puts her at high risk for strokes as well as venous  thrombosis.  She verbalized understanding.  She will work with her outpatient primary care physician to explore options and have plans for smoking cessation. Will need outpatient neurology follow-up in 4 to 6 weeks. I would also recommend following up with outpatient hematology-the hypercoagulable panel that was collected was after her being on heparin drip for multiple hours and I do not think those results could be considered reliable. Outpatient primary care follow-up in 2 to 4 weeks after discharge. I will follow-up on the above pending test results with you. Plan was discussed with the pharmacist as well as attending hospitalist Dr. Posey Pronto.  -- Amie [place], MD Neurologist [facility] Pager: [phone]

## 2021-07-03 NOTE — Progress Notes (Signed)
[place] for heparin Indication:  venous sinus thrombosis  Allergies  Allergen Reactions   Codeine Other (See Comments)    Cough syrup with codeine caused hyperactivity    Patient Measurements: Height: 5' 6"  (167.6 cm) Weight: 93 kg (205 lb 0.4 oz) IBW/kg (Calculated) : 59.3 Heparin Dosing Weight: 78.8 kg  Vital Signs: Temp: 98.3 F (36.8 C) (02/08 0723) Temp Source: Oral (02/08 0723) BP: 116/87 (02/08 0723) Pulse Rate: 93 (02/08 0723)  Labs: Recent Labs    07/02/21 1417 07/02/21 2205 07/03/21 0632  HGB 13.0  --   --   HCT 39.5  --   --   PLT 354  --   --   APTT  --  26  --   LABPROT  --  12.2  --   INR  --  0.9  --   HEPARINUNFRC  --   --  0.26*  CREATININE 0.67  --   --      Estimated Creatinine Clearance: 97.8 mL/min (by C-G formula based on SCr of 0.67 mg/dL).   Medical History: Past Medical History:  Diagnosis Date   Arthritis    Diabetes mellitus without complication ([place])    Fibromyalgia    H/O cardiac radiofrequency ablation    Hypertension    Microprolactinoma [facility])     Assessment: 51 year old female presented with dizziness and inability to perform tasks at work that she is normally able to do. Concern for stroke, seen by neurologist. CTV positive for venous sinus thrombosis, acute vs chronic. Pharmacy consulted for heparin management. She is not on any anticoagulation PTA. Per notes reassuring MRI of the brain without any evidence of stroke.   2/8 0632 HL 0.26   Goal of Therapy:  Heparin level 0.3-0.7 units/ml Monitor platelets by anticoagulation protocol: Yes   Plan:  Heparin level is subtherapeutic. Will increase heparin infusion to 1450 units/hr. Recheck heparin level in 6 hours. CB daily while on heparin.   Oswald Hillock, PharmD, BCPS Clinical Pharmacist 07/03/2021 7:59 AM

## 2021-07-03 NOTE — Progress Notes (Signed)
*  PRELIMINARY RESULTS* Echocardiogram 2D Echocardiogram has been performed.  Sherrie Sport 07/03/2021, 9:52 AM

## 2021-07-03 NOTE — Evaluation (Signed)
Occupational Therapy Evaluation Patient Details Name: Jerusalen Mateja MRN: [id number] DOB: 1970-11-13 Today's Date: 07/03/2021   History of Present Illness Anginette Espejo is a 51 y.o. 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 Imaging negative for acute infarct.  CTV positive for venous sinus thrombosis, acute vs chronic. Heparin infusion initiated 07/02/21. Pharmacy and Neurology following.   Clinical Impression   Ms. Masson was seen for OT evaluation this date. Prior to hospital admission, pt was independent in all aspects of ADL/IADL, working, driving, and denies falls history in past 12 months. Pt lives with fiance and two dogs in a 1 story home with a level entry. Currently pt reporting symptoms have completely resolved. Pt demonstrates baseline independence to perform ADL and mobility tasks and no strength, sensory, coordination, cognitive, or visual deficits appreciated with assessment. No skilled OT needs identified. Will sign off. Please re-consult if additional OT needs arise during this admission.        Recommendations for follow up therapy are one component of a multi-disciplinary discharge planning process, led by the attending physician.  Recommendations may be updated based on patient status, additional functional criteria and insurance authorization.   Follow Up Recommendations  No OT follow up    Assistance Recommended at Discharge None  Patient can return home with the following      Functional Status Assessment  Patient has not had a recent  decline in their functional status  Equipment Recommendations  None recommended by OT    Recommendations for Other Services       Precautions / Restrictions Precautions Precautions: None Precaution Comments: Low fall Restrictions Weight Bearing Restrictions: No      Mobility Bed Mobility Overal bed mobility: Needs Assistance Bed Mobility: Supine to Sit     Supine to sit: Independent          Transfers Overall transfer level: Independent Equipment used: None               General transfer comment: Good safety awareness and confidence with mobility.      Balance Overall balance assessment: Independent, No apparent balance deficits (not formally assessed)                                         ADL either performed or assessed with clinical judgement   ADL Overall ADL's : At baseline;Independent                                       General ADL Comments: Pt able to don shoes, perfom bed/functional mobility independently. She is up ad lib in room, performing all self-care tasks independently. She reports all symptoms have resolved and she is back to her baseline level of functional independence.     Vision Baseline Vision/History: 1 Wears glasses Ability to See in Adequate Light: 1 Impaired Patient Visual Report: No change from baseline       Perception  Praxis      Pertinent Vitals/Pain Pain Assessment Pain Assessment: No/denies pain     Hand Dominance Right   Extremity/Trunk Assessment Upper Extremity Assessment Upper Extremity Assessment: Defer to OT evaluation   Lower Extremity Assessment Lower Extremity Assessment: Overall WFL for tasks assessed   Cervical / Trunk Assessment Cervical / Trunk Assessment: Normal   Communication Communication Communication: No difficulties   Cognition Arousal/Alertness: Awake/alert Behavior During Therapy: WFL for tasks assessed/performed Overall Cognitive Status:  Within Functional Limits for tasks assessed                                 General Comments: Pleasant, conversational, A&Ox4.     General Comments       Exercises Other Exercises Other Exercises: Pt educated on falls prevention strategies for home and hospital.   Shoulder Instructions      Home Living Family/patient expects to be discharged to:: Private residence Living Arrangements: Spouse/significant other Available Help at Discharge: Family;Available PRN/intermittently Type of Home: House Home Access: Level entry     Home Layout: One level     Bathroom Shower/Tub: Teacher, early years/pre: Standard     Home Equipment: None          Prior Functioning/Environment Prior Level of Function : Independent/Modified Independent;Working/employed;Driving                        OT Problem List: Decreased safety awareness;Impaired sensation;Decreased knowledge of use of DME or AE      OT Treatment/Interventions:      OT Goals(Current goals can be found in the care plan section) Acute Rehab OT Goals Patient Stated Goal: To get back to my regular routines. OT Goal Formulation: All assessment and education complete, DC therapy Time For Goal Achievement: 07/03/21 Potential to Achieve Goals: Good  OT Frequency:      Co-evaluation              AM-PAC OT "6 Clicks" Daily Activity     Outcome Measure Help from another person eating meals?: None Help from another person taking care of personal grooming?: None Help from another person toileting, which includes using toliet, bedpan, or urinal?: None Help from another person bathing (including washing, rinsing, drying)?: None Help from another person to put on and taking off regular upper body clothing?: None Help from another person to put on and taking off regular lower body clothing?: None 6 Click Score: 24   End of Session    Activity Tolerance: Patient tolerated treatment  well Patient left: in bed;with family/visitor present;with call bell/phone within reach  OT Visit Diagnosis: Other abnormalities of gait and mobility (R26.89);Other symptoms and signs involving the nervous system (R29.898)                Time: 8416-6063 OT Time Calculation (min): 20 min Charges:  OT General Charges $OT Visit: 1 Visit OT Evaluation $OT Eval Low Complexity: [place], M.S., OTR/L [facility] - [facility] Ascom: [phone] 07/03/21, 10:16 AM

## 2021-07-03 NOTE — Assessment & Plan Note (Signed)
Monitor for bleeding in the setting of heparin infusion

## 2021-07-03 NOTE — Progress Notes (Addendum)
[place] [facility] [place] NAME: Breanna Ramos    MR#:  [id number]  DATE OF BIRTH:  Oct 15, 1970  SUBJECTIVE:  patient came in with dizziness feeling foggy and word finding yesterday. Today appears to be stable. Denies any weakness and speech appears clear. Worked with physical therapy earlier. No new complaints. Family at bedside.  Assessment and Plan  Breanna Ramos is a 51 y.o. 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  This was followed by trouble getting words out.  Acute focus neurologic deficit with provement of symptom suspected due to TIA central venous sinus thrombosis -- patient coming with feeling  dizzy and trouble word finding -- MRI negative for stroke -- CT venogram--Filling defect right transverse sinus approximally 2.5 cm in length compatible with thrombus. This could be acute or chronic.  -- Patient was seen by neurology Dr. Rory Percy. She was on IV heparin drip okay to switch to PO eliquis -- hypoechoic blood work was drawn while patient was on heparin. Patient will need outpatient hyper quick workup with oncology. Should follow-up with Dr. Grayland Ormond and [facility]. This was discussed with patient. -- Neurology currently stable  Hypertension -- continue be bisoprolol and diltiazem  obstructive sleep apnea on CPAP  Type-2 diabetes without complication -- new sliding scale  VITALS:  Blood pressure 108/87, pulse 88, temperature 98.3 F (36.8 C), temperature source Oral, resp. rate 18, height 5' 6"  (1.676 m), weight 93 kg, SpO2 97 %.  PHYSICAL EXAMINATION:   GENERAL:  51 y.o.-year-old patient lying in the bed with no acute distress.  HEENT: Head atraumatic, normocephalic. Oropharynx and nasopharynx clear.  LUNGS: Normal breath sounds  bilaterally, no wheezing, rales, rhonchi.  CARDIOVASCULAR: S1, S2 normal. No murmurs, rubs, or gallops.  ABDOMEN: Soft, nontender, nondistended. Bowel sounds present.  EXTREMITIES: No  edema b/l.    NEUROLOGIC: nonfocal PSYCHIATRIC:  patient is alert and awake SKIN: No obvious rash, lesion, or ulcer.   LABORATORY PANEL:  CBC Recent Labs  Lab 07/03/21 0835  WBC 6.7  HGB 13.5  HCT 41.2  PLT 316    Chemistries  Recent Labs  Lab 07/02/21 1417  NA 136  K 3.9  CL 101  CO2 28  GLUCOSE 102*  BUN 18  CREATININE 0.67  CALCIUM 9.2   Cardiac Enzymes No results for input(s): TROPONINI in the last 168 hours. RADIOLOGY:  CT HEAD WO CONTRAST  Result Date: 07/02/2021 CLINICAL DATA:  Dizziness, non-specific EXAM: CT HEAD WITHOUT CONTRAST TECHNIQUE: Contiguous axial images were obtained from the base of the skull through the vertex without intravenous contrast. RADIATION DOSE REDUCTION: This exam was performed according to the departmental dose-optimization program which includes automated exposure control, adjustment of the mA and/or kV according to patient size and/or use of iterative reconstruction technique. COMPARISON:  None. FINDINGS: Brain: There is no acute intracranial hemorrhage, mass effect, or edema. Gray-white differentiation is preserved. There is no extra-axial fluid collection. Ventricles and sulci are within normal limits in size and configuration. Vascular: No hyperdense vessel or unexpected calcification. Skull: Calvarium is unremarkable. Sinuses/Orbits: No acute finding. Other: None. IMPRESSION: No acute intracranial abnormality. Electronically Signed   By: Macy Mis M.D.   On: 07/02/2021 14:53   MR BRAIN W WO CONTRAST  Result Date: 07/02/2021 CLINICAL DATA:  Provided history: CVA/TIA symptoms. History of microprolactinoma. Additional  history provided: Patient reports dizziness and bilateral leg weakness since 9 a.m. History of microprolactinoma with surgery in 2013.  EXAM: MRI HEAD WITHOUT AND WITH CONTRAST TECHNIQUE: Multiplanar, multiecho pulse sequences of the brain and surrounding structures were obtained without and with intravenous contrast. CONTRAST:  45m GADAVIST GADOBUTROL 1 MMOL/ML IV SOLN COMPARISON:  Head CT 07/02/2021. FINDINGS: Mild intermittent motion degradation. Brain: Dedicated pituitary imaging reveals a partially empty sella turcica. No focal pituitary lesion is identified. The suprasellar cistern is patent. The pituitary stalk is midline and is not abnormally thickened. Cerebral volume is normal. No cortical encephalomalacia is identified. There is no acute infarct. No chronic intracranial blood products. No extra-axial fluid collection. No midline shift. Vascular: Maintained flow voids within the proximal large arterial vessels. Possible small developmental venous anomaly within the periventricular right frontal lobe (anatomic variant). Curvilinear focus of hypoenhancement within the distal transverse and proximal sigmoid dural venous sinuses on the right (for instance as seen on series 26, image 37) (series 100, image 169). This may reflect flow artifact or a small nonocclusive thrombus. Skull and upper cervical spine: No focal suspicious marrow lesion. Susceptibility artifact arising from cervical spinal fusion hardware. Incompletely assessed cervical spondylosis. Sinuses/Orbits: Visualized orbits show no acute finding. Small mucous retention cyst within the left maxillary sinus. Trace mucosal thickening within the bilateral ethmoid sinuses. Impression #3 was called by telephone at the time of interpretation on 07/02/2021 at 5:38 pm to provider Dr. IEllender Hose who verbally acknowledged these results. IMPRESSION: 1. No evidence of acute infarct. 2. No focal pituitary lesion is appreciated. 3. Curvilinear focus of hypoenhancement within the right transverse and sigmoid dural venous sinuses, which may reflect flow artifact or a small non-occlusive thrombus. CT  venography of the head may be helpful for further characterization. 4. Partially empty sella turcica. While this finding often reflects incidental anatomic variation, it can also be associated with idiopathic intracranial hypertension (pseudotumor cerebri). 5. Otherwise unremarkable MRI appearance of the brain. 6. Mild paranasal sinus disease, as described. 7. Incompletely assessed cervical spondylosis and postsurgical changes. Electronically Signed   By: KKellie SimmeringD.O.   On: 07/02/2021 17:40   UKoreaCarotid Bilateral  Result Date: 07/03/2021 CLINICAL DATA:  Transient ischemic attack EXAM: BILATERAL CAROTID DUPLEX ULTRASOUND TECHNIQUE: GPearline Cablesscale imaging, color Doppler and duplex ultrasound were performed of bilateral carotid and vertebral arteries in the neck. COMPARISON:  None. FINDINGS: Criteria: Quantification of carotid stenosis is based on velocity parameters that correlate the residual internal carotid diameter with NASCET-based stenosis levels, using the diameter of the distal internal carotid lumen as the denominator for stenosis measurement. The following velocity measurements were obtained: RIGHT ICA: 50/23 cm/sec CCA: 683/72cm/sec SYSTOLIC ICA/CCA RATIO:  0.8 ECA: 71 cm/sec LEFT ICA: 76/34 cm/sec CCA: 790/21cm/sec SYSTOLIC ICA/CCA RATIO:  1.1 ECA: 70 cm/sec RIGHT CAROTID ARTERY: No significant atherosclerotic plaque RIGHT VERTEBRAL ARTERY:  Antegrade LEFT CAROTID ARTERY:  No significant atherosclerotic plaque LEFT VERTEBRAL ARTERY:  Antegrade IMPRESSION: No evidence of hemodynamically significant stenosis involving the carotid bifurcations bilaterally. Electronically Signed   By: AFidela SalisburyM.D.   On: 07/03/2021 04:12   ECHOCARDIOGRAM COMPLETE  Result Date: 07/03/2021    ECHOCARDIOGRAM REPORT   Patient Name:   Breanna MOULTONDate of Exam: 07/03/2021 Medical Rec #:  [id number]   Height:       66.0 in Accession #:    [id number]  Weight:       205.0 lb Date of Birth:  107/13/1972  BSA:          2.021  m Patient Age:    73 years     BP:           116/87 mmHg Patient Gender: F            HR:           93 bpm. Exam Location:  [facility] Procedure: 2D Echo, Cardiac Doppler and Color Doppler Indications:     Stroke I63.9  History:         Patient has no prior history of Echocardiogram examinations.                  Risk Factors:Diabetes and Hypertension.  Sonographer:     Sherrie Sport Referring Phys:  [id number] Athena Masse Diagnosing Phys: Kate Sable MD IMPRESSIONS  1. Left ventricular ejection fraction, by estimation, is 55 to 60%. The left ventricle has normal function. The left ventricle has no regional wall motion abnormalities. Left ventricular diastolic parameters are consistent with Grade I diastolic dysfunction (impaired relaxation).  2. Right ventricular systolic function is normal. The right ventricular size is normal.  3. The mitral valve is normal in structure. No evidence of mitral valve regurgitation.  4. The aortic valve was not well visualized. Aortic valve regurgitation is not visualized.  5. The inferior vena cava is normal in size with greater than 50% respiratory variability, suggesting right atrial pressure of 3 mmHg. FINDINGS  Left Ventricle: Left ventricular ejection fraction, by estimation, is 55 to 60%. The left ventricle has normal function. The left ventricle has no regional wall motion abnormalities. The left ventricular internal cavity size was normal in size. There is  no left ventricular hypertrophy. Left ventricular diastolic parameters are consistent with Grade I diastolic dysfunction (impaired relaxation). Right Ventricle: The right ventricular size is normal. No increase in right ventricular wall thickness. Right ventricular systolic function is normal. Left Atrium: Left atrial size was normal in size. Right Atrium: Right atrial size was normal in size. Pericardium: There is no evidence of pericardial effusion. Mitral Valve: The mitral valve is normal in structure. No evidence of  mitral valve regurgitation. MV peak gradient, 3.2 mmHg. The mean mitral valve gradient is 2.0 mmHg. Tricuspid Valve: The tricuspid valve is normal in structure. Tricuspid valve regurgitation is not demonstrated. Aortic Valve: The aortic valve was not well visualized. Aortic valve regurgitation is not visualized. Aortic valve mean gradient measures 3.0 mmHg. Aortic valve peak gradient measures 5.7 mmHg. Aortic valve area, by VTI measures 3.52 cm. Pulmonic Valve: The pulmonic valve was not well visualized. Pulmonic valve regurgitation is not visualized. Aorta: The aortic root is normal in size and structure. Venous: The inferior vena cava is normal in size with greater than 50% respiratory variability, suggesting right atrial pressure of 3 mmHg. IAS/Shunts: No atrial level shunt detected by color flow Doppler.  LEFT VENTRICLE PLAX 2D LVIDd:         4.40 cm   Diastology LVIDs:         2.85 cm   LV e' medial:    5.55 cm/s LV PW:         1.15 cm   LV E/e' medial:  10.7 LV IVS:        1.15 cm   LV e' lateral:   7.29 cm/s LVOT diam:     2.10 cm   LV E/e' lateral: 8.2 LV SV:         70 LV SV  Index:   35 LVOT Area:     3.46 cm  RIGHT VENTRICLE RV S prime:     17.10 cm/s TAPSE (M-mode): 2.5 cm LEFT ATRIUM             Index        RIGHT ATRIUM           Index LA diam:        2.85 cm 1.41 cm/m   RA Area:     12.40 cm LA Vol (A2C):   50.8 ml 25.14 ml/m  RA Volume:   30.60 ml  15.14 ml/m LA Vol (A4C):   37.9 ml 18.75 ml/m LA Biplane Vol: 45.5 ml 22.51 ml/m  AORTIC VALVE                    PULMONIC VALVE AV Area (Vmax):    2.77 cm     PV Vmax:        0.70 m/s AV Area (Vmean):   2.56 cm     PV Vmean:       46.200 cm/s AV Area (VTI):     3.52 cm     PV VTI:         0.126 m AV Vmax:           119.00 cm/s  PV Peak grad:   1.9 mmHg AV Vmean:          84.100 cm/s  PV Mean grad:   1.0 mmHg AV VTI:            0.199 m      RVOT Peak grad: 3 mmHg AV Peak Grad:      5.7 mmHg AV Mean Grad:      3.0 mmHg LVOT Vmax:         95.30  cm/s LVOT Vmean:        62.200 cm/s LVOT VTI:          0.202 m LVOT/AV VTI ratio: 1.02  AORTA Ao Root diam: 3.37 cm MITRAL VALVE               TRICUSPID VALVE MV Area (PHT): 3.95 cm    TR Peak grad:   18.8 mmHg MV Area VTI:   3.46 cm    TR Vmax:        217.00 cm/s MV Peak grad:  3.2 mmHg MV Mean grad:  2.0 mmHg    SHUNTS MV Vmax:       0.89 m/s    Systemic VTI:  0.20 m MV Vmean:      65.0 cm/s   Systemic Diam: 2.10 cm MV Decel Time: 192 msec    Pulmonic VTI:  0.148 m MV E velocity: 59.60 cm/s MV A velocity: 71.60 cm/s MV E/A ratio:  0.83 Kate Sable MD Electronically signed by Kate Sable MD Signature Date/Time: 07/03/2021/2:50:27 PM    Final    CT VENOGRAM HEAD  Result Date: 07/02/2021 CLINICAL DATA:  CVA/TIA symptoms. Dizziness and bilateral leg weakness. Possible dural venous thrombosis on MRI today. EXAM: CT VENOGRAM HEAD TECHNIQUE: Venographic phase images of the brain were obtained following the administration of intravenous contrast. Multiplanar reformats and maximum intensity projections were generated. RADIATION DOSE REDUCTION: This exam was performed according to the departmental dose-optimization program which includes automated exposure control, adjustment of the mA and/or kV according to patient size and/or use of iterative reconstruction technique. CONTRAST:  96m OMNIPAQUE IOHEXOL 350 MG/ML SOLN COMPARISON:  MRI head with contrast  07/02/2021.  CT head 07/02/2021 FINDINGS: Head CT not repeated. Postcontrast imaging of the brain reveals no enhancing lesion identified. Right transverse sinus is dominant. There is an irregular filling defect in the right transverse sinus as noted on MRI. This is most consistent with thrombus and could be acute or chronic. Hypoplastic left transverse sinus without filling defect. Superior sagittal sinus is patent. IMPRESSION: Filling defect right transverse sinus approximally 2.5 cm in length compatible with thrombus. This could be acute or chronic. This  correlates with the MRI earlier today. Electronically Signed   By: Franchot Gallo M.D.   On: 07/02/2021 19:41      Procedures: Family communication : family at bedside Consults : neurology CODE STATUS: full DVT Prophylaxis : eliquis Level of care: Progressive Status is: Observation The patient remains OBS appropriate and will d/c before 2 midnights. Anticipate discharge tomorrow if remains stable patient is in agreement with plan         TOTAL TIME TAKING CARE OF THIS PATIENT: 25 minutes.  >50% time spent on counselling and coordination of care  Note: This dictation was prepared with Dragon dictation along with smaller phrase technology. Any transcriptional errors that result from this process are unintentional.  Fritzi Mandes M.D    [facility]   CC: Primary care physician; Gladstone Lighter, MD

## 2021-07-04 ENCOUNTER — Encounter: Payer: Self-pay | Admitting: Internal Medicine

## 2021-07-04 DIAGNOSIS — Z87898 Personal history of other specified conditions: Secondary | ICD-10-CM | POA: Diagnosis not present

## 2021-07-04 DIAGNOSIS — G08 Intracranial and intraspinal phlebitis and thrombophlebitis: Secondary | ICD-10-CM | POA: Diagnosis not present

## 2021-07-04 LAB — CBC
HCT: 39.4 % (ref 36.0–46.0)
Hemoglobin: 13.3 g/dL (ref 12.0–15.0)
MCH: 30.2 pg (ref 26.0–34.0)
MCHC: 33.8 g/dL (ref 30.0–36.0)
MCV: 89.5 fL (ref 80.0–100.0)
Platelets: 316 10*3/uL (ref 150–400)
RBC: 4.4 MIL/uL (ref 3.87–5.11)
RDW: 12.9 % (ref 11.5–15.5)
WBC: 7.6 10*3/uL (ref 4.0–10.5)
nRBC: 0 % (ref 0.0–0.2)

## 2021-07-04 LAB — PROTEIN C AG + PROTEIN S AG
Protein C, Total: 135 % (ref 60–150)
Protein S Ag, Free: 94 % (ref 61–136)
Protein S Ag, Total: 85 % (ref 60–150)

## 2021-07-04 LAB — GLUCOSE, CAPILLARY: Glucose-Capillary: 114 mg/dL — ABNORMAL HIGH (ref 70–99)

## 2021-07-04 MED ORDER — ATORVASTATIN CALCIUM 80 MG PO TABS: 80.0000 mg | ORAL_TABLET | Freq: Every day | ORAL | 3 refills | Status: DC

## 2021-07-04 MED ORDER — APIXABAN 5 MG PO TABS: 10.0000 mg | ORAL_TABLET | Freq: Two times a day (BID) | ORAL | 3 refills | Status: DC

## 2021-07-04 MED ORDER — ATORVASTATIN CALCIUM 80 MG PO TABS
80.0000 mg | ORAL_TABLET | Freq: Every day | ORAL | Status: DC
  Administered 2021-07-04: 80 mg via ORAL
  Filled 2021-07-04: qty 1

## 2021-07-04 NOTE — Progress Notes (Signed)
Neurology Progress Note   S:// Seen and examined. No complaints. ROS negative this AM.   O:// Current vital signs: BP (!) 116/99 (BP Location: Right Arm)    Pulse 99    Temp 98.3 F (36.8 C) (Oral)    Resp 20    Ht 5' 6"  (1.676 m)    Wt 93 kg    SpO2 99%    BMI 33.09 kg/m  Vital signs in last 24 hours: Temp:  [97.7 F (36.5 C)-98.3 F (36.8 C)] 98.3 F (36.8 C) (02/09 0801) Pulse Rate:  [72-99] 99 (02/09 0801) Resp:  [17-20] 20 (02/09 0801) BP: (108-156)/(69-99) 116/99 (02/09 0801) SpO2:  [95 %-99 %] 99 % (02/09 0801) GENERAL: Awake, alert in NAD HEENT: - Normocephalic and atraumatic, dry mm, no LN++, no Thyromegally LUNGS - Clear to auscultation bilaterally with no wheezes CV - S1S2 RRR, no m/r/g, equal pulses bilaterally. ABDOMEN - Soft, nontender, nondistended with normoactive BS Ext: warm, well perfused, intact peripheral pulses   NEURO:  Mental Status: AA&Ox3  Language: speech is normal without dysarthria  Naming, repetition, fluency, and comprehension intact. Cranial Nerves: PERRL EOMI, visual fields full, no facial asymmetry, facial sensation intact, hearing intact, tongue/uvula/soft palate midline, normal sternocleidomastoid and trapezius muscle strength. No evidence of tongue atrophy or fibrillations Motor: 5/5 without Tone: is normal and bulk is normal Sensation- Intact to light touch bilaterally Coordination: FTN intact bilaterally, no ataxia in BLE. Gait- deferred   NIHSS-0-remains unchanged from yesterday  Medications  Current Facility-Administered Medications:     stroke: mapping our early stages of recovery book, , Does not apply, Once, Athena Masse, MD   acetaminophen (TYLENOL) tablet 650 mg, 650 mg, Oral, Q4H PRN **OR** acetaminophen (TYLENOL) 160 MG/5ML solution 650 mg, 650 mg, Per Tube, Q4H PRN **OR** acetaminophen (TYLENOL) suppository 650 mg, 650 mg, Rectal, Q4H PRN, Athena Masse, MD   apixaban (ELIQUIS) tablet 10 mg, 10 mg, Oral, BID, 10 mg  at 07/03/21 2120 **FOLLOWED BY** [START ON 07/10/2021] apixaban (ELIQUIS) tablet 5 mg, 5 mg, Oral, BID, Fritzi Mandes, MD   buPROPion (WELLBUTRIN XL) 24 hr tablet 300 mg, 300 mg, Oral, Daily, Fritzi Mandes, MD, 300 mg at 07/03/21 1033   cholecalciferol (VITAMIN D3) tablet 2,000 Units, 2,000 Units, Oral, Daily, Fritzi Mandes, MD, 2,000 Units at 07/03/21 1034   cyclobenzaprine (FLEXERIL) tablet 10 mg, 10 mg, Oral, TID PRN, Fritzi Mandes, MD, 10 mg at 07/03/21 2120   docusate sodium (COLACE) capsule 100 mg, 100 mg, Oral, Daily PRN, Foust, Katy L, NP, 100 mg at 07/03/21 2120   FLUoxetine (PROZAC) capsule 40 mg, 40 mg, Oral, Daily, Fritzi Mandes, MD, 40 mg at 07/03/21 1033   gabapentin (NEURONTIN) capsule 300 mg, 300 mg, Oral, Daily, Fritzi Mandes, MD, 300 mg at 07/03/21 1034   insulin aspart (novoLOG) injection 0-15 Units, 0-15 Units, Subcutaneous, TID WC, Judd Gaudier V, MD, 3 Units at 07/03/21 1211   insulin aspart (novoLOG) injection 0-5 Units, 0-5 Units, Subcutaneous, QHS, Judd Gaudier V, MD   magnesium oxide (MAG-OX) tablet 400 mg, 400 mg, Oral, Daily, Fritzi Mandes, MD, 400 mg at 07/03/21 1033   melatonin tablet 5 mg, 5 mg, Oral, QHS, Fritzi Mandes, MD, 5 mg at 07/03/21 2120   mesalamine (CANASA) suppository 1,000 mg, 1,000 mg, Rectal, QHS PRN, Fritzi Mandes, MD   multivitamin with minerals tablet 1 tablet, 1 tablet, Oral, Daily, Fritzi Mandes, MD, 1 tablet at 07/03/21 1033   oxyCODONE-acetaminophen (PERCOCET/ROXICET) 5-325 MG per tablet 2 tablet, 2  tablet, Oral, Q6H PRN, Fritzi Mandes, MD, 2 tablet at 07/03/21 1750   pantoprazole (PROTONIX) EC tablet 40 mg, 40 mg, Oral, BID Eldridge Dace, MD, 40 mg at 07/03/21 1750   venlafaxine XR (EFFEXOR-XR) 24 hr capsule 75 mg, 75 mg, Oral, Daily, Fritzi Mandes, MD, 75 mg at 07/03/21 1033 Labs CBC    Component Value Date/Time   WBC 7.6 07/04/2021 0521   RBC 4.40 07/04/2021 0521   HGB 13.3 07/04/2021 0521   HGB 14.1 02/17/2020 1026   HCT 39.4 07/04/2021 0521   HCT 41.6  02/17/2020 1026   PLT 316 07/04/2021 0521   PLT 406 02/17/2020 1026   MCV 89.5 07/04/2021 0521   MCV 88 02/17/2020 1026   MCH 30.2 07/04/2021 0521   MCHC 33.8 07/04/2021 0521   RDW 12.9 07/04/2021 0521   RDW 12.9 02/17/2020 1026    CMP     Component Value Date/Time   NA 136 07/02/2021 1417   K 3.9 07/02/2021 1417   CL 101 07/02/2021 1417   CO2 28 07/02/2021 1417   GLUCOSE 102 (H) 07/02/2021 1417   BUN 18 07/02/2021 1417   CREATININE 0.67 07/02/2021 1417   CALCIUM 9.2 07/02/2021 1417   PROT 7.0 02/13/2020 1617   ALBUMIN 4.0 02/13/2020 1617   AST 20 02/13/2020 1617   ALT 25 02/13/2020 1617   ALKPHOS 80 02/13/2020 1617   BILITOT 0.6 02/13/2020 1617   GFRNONAA >60 07/02/2021 1417   GFRAA >60 02/15/2020 0718   Lipid Panel     Component Value Date/Time   CHOL 210 (H) 07/03/2021 0835   CHOL 208 (H) 07/03/2021 0835   TRIG 66 07/03/2021 0835   TRIG 85 07/03/2021 0835   HDL 60 07/03/2021 0835   HDL 60 07/03/2021 0835   CHOLHDL 3.5 07/03/2021 0835   CHOLHDL 3.5 07/03/2021 0835   VLDL 13 07/03/2021 0835   VLDL 17 07/03/2021 0835   LDLCALC 137 (H) 07/03/2021 0835   LDLCALC 131 (H) 07/03/2021 0835   LDL 137 A1c 5.1 2D echo-LVEF 55 to 60%.  Grade 1 diastolic dysfunction.  Mitral valve normal.  Aortic valve not well visualized.  Left atrium size normal.  No atrial level shunt detected by color-flow Doppler.  Imaging I have reviewed images in epic and the results pertinent to this consultation are: MRI brain negative for stroke CT venogram with right transverse sinus venous thrombosis.  Assessment: 51 year old presenting for 5 to 6-hour episode of difficulty with word finding and difficulty with comprehension of words without any motor or sensory or cranial nerve deficits reported by history and on presentation exam normal with NIH of 0. Head imaging with right transverse dural venous sinus thrombosis. Unclear etiology of the thrombus Presenting symptoms more consistent with  left hemispheric TIA.  Impression: Left hemispheric TIA DVST  Recommendations: -LDL above goal-start high intensity statin atorvastatin 80 now and daily for goal LDL less than 70. -A1c at goal -Continue Eliquis-was transition from heparin drip yesterday.  Risks and benefits for anticoagulation discussed with the patient in detail and answered all questions. -Reiterated smoking cessation. -Outpatient hematology, neurology and outpatient primary care follow-up. -Will need repeat hypercoagulable work-up after off of anticoagulation due to the hypercoagulable panel being drawn after being on heparin for significant duration of time in the hospital and results might not be fully usable. -Primary hospitalist Dr. Posey Pronto has reached out to the hematologist for outpatient follow-up-appreciate their prompt scheduling of the patient  Discussed with Dr. Posey Pronto on the floor.  Please call with questions.   -- Amie Portland, MD Neurologist [facility] Pager: [phone]

## 2021-07-04 NOTE — Progress Notes (Signed)
Nursing Discharge Note   Admit Date: 07/02/2021  Discharge date: 07/04/2021   Breanna Ramos is  to be discharged home per MD order.  AVS completed. Reviewed with patient and family at bedside. Highlighted copy provided for patient to take home.  Patient/caregiver able to verbalize understanding of discharge instructions. PIV removed. Patient stable upon discharge.   Discharge Instructions     Diet - low sodium heart healthy   Complete by: As directed    Increase activity slowly   Complete by: As directed       Allergies as of 07/04/2021       Reactions   Codeine Other (See Comments)   Cough syrup with codeine caused hyperactivity        Medication List     TAKE these medications    apixaban 5 MG Tabs tablet Commonly known as: ELIQUIS Take 2 tablets (10 mg total) by mouth 2 (two) times daily. From 07/10/2021 take 5 mg (1 tab) twice a day   ascorbic acid 500 MG tablet Commonly known as: VITAMIN C Take 500 mg by mouth daily.   atorvastatin 80 MG tablet Commonly known as: LIPITOR Take 1 tablet (80 mg total) by mouth daily.   bisoprolol 10 MG tablet Commonly known as: ZEBETA Take 10 mg by mouth daily.   buPROPion 300 MG 24 hr tablet Commonly known as: WELLBUTRIN XL Take 300 mg by mouth daily.   cabergoline 0.5 MG tablet Commonly known as: DOSTINEX Take 0.5 mg by mouth 2 (two) times a week. Tuesday/Saturday   celecoxib 200 MG capsule Commonly known as: CELEBREX Take 200 mg by mouth at bedtime.   Cholecalciferol 50 MCG (2000 UT) Caps Take 2,000 Units by mouth daily.   cyclobenzaprine 10 MG tablet Commonly known as: FLEXERIL Take 10 mg by mouth 3 (three) times daily as needed for muscle spasms.   diltiazem 240 MG 24 hr capsule Commonly known as: TIAZAC Take 240 mg by mouth daily.   FLUoxetine 40 MG capsule Commonly known as: PROZAC Take 40 mg by mouth daily.   gabapentin 300 MG capsule Commonly known as: NEURONTIN Take 300 mg by mouth daily.   loratadine  10 MG tablet Commonly known as: CLARITIN Take 10 mg by mouth daily.   Magnesium Oxide 500 MG Tabs Take 1 tablet by mouth daily.   Melatonin 5 MG Caps Take 5 mg by mouth at bedtime.   mesalamine 1000 MG suppository Commonly known as: CANASA Place 1 suppository (1,000 mg total) rectally at bedtime. What changed:  when to take this reasons to take this   metFORMIN 500 MG tablet Commonly known as: GLUCOPHAGE Take 1,000 mg by mouth 2 (two) times daily with a meal.   Multi-Vitamin tablet Take 1 tablet by mouth daily.   ondansetron 4 MG tablet Commonly known as: ZOFRAN Take 4 mg by mouth every 12 (twelve) hours as needed for nausea or vomiting.   oxyCODONE-acetaminophen 10-325 MG tablet Commonly known as: PERCOCET Take 1 tablet by mouth every 6 (six) hours as needed for pain.   pantoprazole 40 MG tablet Commonly known as: PROTONIX Take 1 tablet (40 mg total) by mouth 2 (two) times daily before a meal.   venlafaxine XR 75 MG 24 hr capsule Commonly known as: EFFEXOR-XR Take 75 mg by mouth daily.        Discharge Instructions/ Education: Discharge instructions given to patient/family with verbalized understanding. Discharge education completed with patient/family including: follow up instructions, medication list, discharge activities, and limitations if  indicated. Patient ambulatory and able to walk off unit accompanied by significant other and discharged home via private automobile.

## 2021-07-04 NOTE — Progress Notes (Signed)
Discharge instructions explained to pt/ verbalized an understanding/ ivs and tele removed/ Eliquis coupon provided for pt / will transport off unit when ride arrives

## 2021-07-04 NOTE — Consult Note (Signed)
Patient was counseled on apixaban and given coupons (30-day free trial and 10-day co-pay). Pt states she understood the side effects and understand how to take it.   Thanks, Eleonore Chiquito, PharmD

## 2021-07-04 NOTE — Discharge Summary (Addendum)
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  This was followed by trouble getting words out.   Acute focus neurologic deficit with provement of symptom suspected due to TIA Central venous sinus thrombosis -- patient coming with feeling  dizzy and trouble word finding -- MRI negative for stroke -- CT venogram--Filling defect right transverse sinus approximally 2.5 cm in length compatible with thrombus. This could be acute or chronic.  -- Patient was seen by neurology Dr. Rory Percy. She was on IV heparin drip okay to switch to PO eliquis -- hypoechoic blood work was drawn while patient was on heparin. Patient will need outpatient hypercoaguable workup with oncology. Should follow-up with Dr. Grayland Ormond at [facility]. This was discussed with patient. -- Neurology wise pt is currently stable  --Dr Grayland Ormond is informed about pt needing f/u  Hyperlipidemia --started Atorvastatin  Hypertension -- continue bisoprolol and diltiazem   Obstructive sleep apnea on CPAP   Type-2 diabetes without complication --on sliding scale  Overall stable NO PT/OT follow up needed D/c home. Pt agreeable CONSULTS OBTAINED:  Neurology  DISCHARGE MEDICATIONS:   Allergies as  of 07/04/2021       Reactions   Codeine Other (See Comments)   Cough syrup with codeine caused hyperactivity        Medication List     TAKE these medications    apixaban 5 MG Tabs tablet Commonly known as: ELIQUIS Take 2 tablets (10 mg total) by mouth 2 (two) times daily. From 07/10/2021 take 5 mg (1 tab) twice a day   ascorbic acid 500 MG tablet Commonly known as: VITAMIN C Take 500 mg by mouth daily.   atorvastatin 80 MG tablet Commonly known as: LIPITOR Take 1 tablet (80 mg total) by mouth daily.   bisoprolol 10 MG tablet Commonly known as: ZEBETA Take 10 mg by mouth daily.   buPROPion 300 MG 24 hr tablet Commonly known as: WELLBUTRIN XL Take 300 mg by mouth daily.   cabergoline 0.5 MG tablet Commonly known as: DOSTINEX Take 0.5 mg by mouth 2 (two) times a week. Tuesday/Saturday   celecoxib 200 MG capsule Commonly known as: CELEBREX Take 200 mg by mouth at bedtime.   Cholecalciferol 50 MCG (2000 UT) Caps Take 2,000 Units by mouth daily.   cyclobenzaprine 10 MG tablet Commonly known as: FLEXERIL Take 10 mg by mouth 3 (three) times daily as needed for muscle spasms.   diltiazem 240 MG 24 hr capsule Commonly known as: TIAZAC Take 240 mg by mouth daily.   FLUoxetine 40 MG capsule Commonly known as: PROZAC Take 40 mg by mouth daily.   gabapentin 300 MG capsule Commonly known as: NEURONTIN Take 300 mg by mouth daily.   loratadine 10 MG tablet Commonly known as: CLARITIN Take 10 mg by mouth daily.   Magnesium  Oxide 500 MG Tabs Take 1 tablet by mouth daily.   Melatonin 5 MG Caps Take 5 mg by mouth at bedtime.   mesalamine 1000 MG suppository Commonly known as: CANASA Place 1 suppository (1,000 mg total) rectally at bedtime. What changed:  when to take this reasons to take this   metFORMIN 500 MG tablet Commonly known as: GLUCOPHAGE Take 1,000 mg by mouth 2 (two) times daily with a meal.   Multi-Vitamin tablet Take 1 tablet by mouth daily.    ondansetron 4 MG tablet Commonly known as: ZOFRAN Take 4 mg by mouth every 12 (twelve) hours as needed for nausea or vomiting.   oxyCODONE-acetaminophen 10-325 MG tablet Commonly known as: PERCOCET Take 1 tablet by mouth every 6 (six) hours as needed for pain.   pantoprazole 40 MG tablet Commonly known as: PROTONIX Take 1 tablet (40 mg total) by mouth 2 (two) times daily before a meal.   venlafaxine XR 75 MG 24 hr capsule Commonly known as: EFFEXOR-XR Take 75 mg by mouth daily.        If you experience worsening of your admission symptoms, develop shortness of breath, life threatening emergency, suicidal or homicidal thoughts you must seek medical attention immediately by calling 911 or calling your MD immediately  if symptoms less severe.  You Must read complete instructions/literature along with all the possible adverse reactions/side effects for all the Medicines you take and that have been prescribed to you. Take any new Medicines after you have completely understood and accept all the possible adverse reactions/side effects.   Please note  You were cared for by a hospitalist during your hospital stay. If you have any questions about your discharge medications or the care you received while you were in the hospital after you are discharged, you can call the unit and asked to speak with the hospitalist on call if the hospitalist that took care of you is not available. Once you are discharged, your primary care physician will handle any further medical issues. Please note that NO REFILLS for any discharge medications will be authorized once you are discharged, as it is imperative that you return to your primary care physician (or establish a relationship with a primary care physician if you do not have one) for your aftercare needs so that they can reassess your need for medications and monitor your lab values. Today   SUBJECTIVE   No new symptoms At baseline No family in the  room  VITAL SIGNS:  Blood pressure (!) 116/99, pulse 99, temperature 98.3 F (36.8 C), temperature source Oral, resp. rate 20, height 5' 6"  (1.676 m), weight 93 kg, SpO2 99 %.  I/O:   Intake/Output Summary (Last 24 hours) at 07/04/2021 0819 Last data filed at 07/03/2021 1816 Gross per 24 hour  Intake 240 ml  Output --  Net 240 ml    PHYSICAL EXAMINATION:  GENERAL:  51 y.o.-year-old patient lying in the bed with no acute distress.  LUNGS: Normal breath sounds bilaterally, no wheezing, rales,rhonchi or crepitation.  CARDIOVASCULAR: S1, S2 normal. No murmurs, rubs, or gallops.  ABDOMEN: Soft, non-tender, non-distended. Bowel sounds present.  EXTREMITIES: No  clubbing.  NEUROLOGIC: non-focal PSYCHIATRIC:  patient is alert and awake  SKIN: No obvious rash, lesion, or ulcer.   DATA REVIEW:   CBC  Recent Labs  Lab 07/04/21 0521  WBC 7.6  HGB 13.3  HCT 39.4  PLT 316    Chemistries  Recent Labs  Lab 07/02/21 1417  NA 136  K 3.9  CL 101  CO2 28  GLUCOSE 102*  BUN 18  CREATININE 0.67  CALCIUM 9.2    Microbiology Results   Recent Results (from the past 240 hour(s))  Resp Panel by RT-PCR (Flu A&B, Covid) Nasopharyngeal Swab     Status: None   Collection Time: 07/03/21 12:35 AM   Specimen: Nasopharyngeal Swab; Nasopharyngeal(NP) swabs in vial transport medium  Result Value Ref Range Status   SARS Coronavirus 2 by RT PCR NEGATIVE NEGATIVE Final    Comment: (NOTE) SARS-CoV-2 target nucleic acids are NOT DETECTED.  The SARS-CoV-2 RNA is generally detectable in upper respiratory specimens during the acute phase of infection. The lowest concentration of SARS-CoV-2 viral copies this assay can detect is 138 copies/mL. A negative result does not preclude SARS-Cov-2 infection and should not be used as the sole basis for treatment or other patient management decisions. A negative result may occur with  improper specimen collection/handling, submission of specimen other than  nasopharyngeal swab, presence of viral mutation(s) within the areas targeted by this assay, and inadequate number of viral copies(<138 copies/mL). A negative result must be combined with clinical observations, patient history, and epidemiological information. The expected result is Negative.  Fact Sheet for Patients:  EntrepreneurPulse.com.au  Fact Sheet for Healthcare Providers:  IncredibleEmployment.be  This test is no t yet approved or cleared by the [place] FDA and  has been authorized for detection and/or diagnosis of SARS-CoV-2 by FDA under an Emergency Use Authorization (EUA). This EUA will remain  in effect (meaning this test can be used) for the duration of the COVID-19 declaration under Section 564(b)(1) of the Act, 21 U.S.C.section 360bbb-3(b)(1), unless the authorization is terminated  or revoked sooner.       Influenza A by PCR NEGATIVE NEGATIVE Final   Influenza B by PCR NEGATIVE NEGATIVE Final    Comment: (NOTE) The Xpert Xpress SARS-CoV-2/FLU/RSV plus assay is intended as an aid in the diagnosis of influenza from Nasopharyngeal swab specimens and should not be used as a sole basis for treatment. Nasal washings and aspirates are unacceptable for Xpert Xpress SARS-CoV-2/FLU/RSV testing.  Fact Sheet for Patients: EntrepreneurPulse.com.au  Fact Sheet for Healthcare Providers: IncredibleEmployment.be  This test is not yet approved or cleared by the [place] FDA and has been authorized for detection and/or diagnosis of SARS-CoV-2 by FDA under an Emergency Use Authorization (EUA). This EUA will remain in effect (meaning this test can be used) for the duration of the COVID-19 declaration under Section 564(b)(1) of the Act, 21 U.S.C. section 360bbb-3(b)(1), unless the authorization is terminated or revoked.  Performed at [facility], [address] [place], [place]  [address]     RADIOLOGY:  CT HEAD WO CONTRAST  Result Date: 07/02/2021 CLINICAL DATA:  Dizziness, non-specific EXAM: CT HEAD WITHOUT CONTRAST TECHNIQUE: Contiguous axial images were obtained from the base of the skull through the vertex without intravenous contrast. RADIATION DOSE REDUCTION: This exam was performed according to the departmental dose-optimization program which includes automated exposure control, adjustment of the mA and/or kV according to patient size and/or use of iterative reconstruction technique. COMPARISON:  None. FINDINGS: Brain: There is no acute intracranial hemorrhage, mass effect, or edema. Gray-white differentiation is preserved. There is no extra-axial fluid collection. Ventricles and sulci are within normal limits in size and configuration. Vascular: No hyperdense vessel or unexpected calcification. Skull: Calvarium is unremarkable. Sinuses/Orbits: No acute finding. Other: None. IMPRESSION: No acute intracranial abnormality. Electronically Signed   By: Macy Mis  M.D.   On: 07/02/2021 14:53   MR BRAIN W WO CONTRAST  Result Date: 07/02/2021 CLINICAL DATA:  Provided history: CVA/TIA symptoms. History of microprolactinoma. Additional history provided: Patient reports dizziness and bilateral leg weakness since 9 a.m. History of microprolactinoma with surgery in 2013. EXAM: MRI HEAD WITHOUT AND WITH CONTRAST TECHNIQUE: Multiplanar, multiecho pulse sequences of the brain and surrounding structures were obtained without and with intravenous contrast. CONTRAST:  33m GADAVIST GADOBUTROL 1 MMOL/ML IV SOLN COMPARISON:  Head CT 07/02/2021. FINDINGS: Mild intermittent motion degradation. Brain: Dedicated pituitary imaging reveals a partially empty sella turcica. No focal pituitary lesion is identified. The suprasellar cistern is patent. The pituitary stalk is midline and is not abnormally thickened. Cerebral volume is normal. No cortical encephalomalacia is identified. There is no acute  infarct. No chronic intracranial blood products. No extra-axial fluid collection. No midline shift. Vascular: Maintained flow voids within the proximal large arterial vessels. Possible small developmental venous anomaly within the periventricular right frontal lobe (anatomic variant). Curvilinear focus of hypoenhancement within the distal transverse and proximal sigmoid dural venous sinuses on the right (for instance as seen on series 26, image 37) (series 100, image 169). This may reflect flow artifact or a small nonocclusive thrombus. Skull and upper cervical spine: No focal suspicious marrow lesion. Susceptibility artifact arising from cervical spinal fusion hardware. Incompletely assessed cervical spondylosis. Sinuses/Orbits: Visualized orbits show no acute finding. Small mucous retention cyst within the left maxillary sinus. Trace mucosal thickening within the bilateral ethmoid sinuses. Impression #3 was called by telephone at the time of interpretation on 07/02/2021 at 5:38 pm to provider Dr. IEllender Hose who verbally acknowledged these results. IMPRESSION: 1. No evidence of acute infarct. 2. No focal pituitary lesion is appreciated. 3. Curvilinear focus of hypoenhancement within the right transverse and sigmoid dural venous sinuses, which may reflect flow artifact or a small non-occlusive thrombus. CT venography of the head may be helpful for further characterization. 4. Partially empty sella turcica. While this finding often reflects incidental anatomic variation, it can also be associated with idiopathic intracranial hypertension (pseudotumor cerebri). 5. Otherwise unremarkable MRI appearance of the brain. 6. Mild paranasal sinus disease, as described. 7. Incompletely assessed cervical spondylosis and postsurgical changes. Electronically Signed   By: KKellie SimmeringD.O.   On: 07/02/2021 17:40   UKoreaCarotid Bilateral  Result Date: 07/03/2021 CLINICAL DATA:  Transient ischemic attack EXAM: BILATERAL CAROTID DUPLEX  ULTRASOUND TECHNIQUE: GPearline Cablesscale imaging, color Doppler and duplex ultrasound were performed of bilateral carotid and vertebral arteries in the neck. COMPARISON:  None. FINDINGS: Criteria: Quantification of carotid stenosis is based on velocity parameters that correlate the residual internal carotid diameter with NASCET-based stenosis levels, using the diameter of the distal internal carotid lumen as the denominator for stenosis measurement. The following velocity measurements were obtained: RIGHT ICA: 50/23 cm/sec CCA: 656/43cm/sec SYSTOLIC ICA/CCA RATIO:  0.8 ECA: 71 cm/sec LEFT ICA: 76/34 cm/sec CCA: 732/95cm/sec SYSTOLIC ICA/CCA RATIO:  1.1 ECA: 70 cm/sec RIGHT CAROTID ARTERY: No significant atherosclerotic plaque RIGHT VERTEBRAL ARTERY:  Antegrade LEFT CAROTID ARTERY:  No significant atherosclerotic plaque LEFT VERTEBRAL ARTERY:  Antegrade IMPRESSION: No evidence of hemodynamically significant stenosis involving the carotid bifurcations bilaterally. Electronically Signed   By: AFidela SalisburyM.D.   On: 07/03/2021 04:12   ECHOCARDIOGRAM COMPLETE  Result Date: 07/03/2021    ECHOCARDIOGRAM REPORT   Patient Name:   JRON JUNCODate of Exam: 07/03/2021 Medical Rec #:  [id number]   Height:  66.0 in Accession #:    [id number]   Weight:       205.0 lb Date of Birth:  [birth year]    BSA:          2.021 m Patient Age:    56 years     BP:           116/87 mmHg Patient Gender: F            HR:           93 bpm. Exam Location:  [facility] Procedure: 2D Echo, Cardiac Doppler and Color Doppler Indications:     Stroke I63.9  History:         Patient has no prior history of Echocardiogram examinations.                  Risk Factors:Diabetes and Hypertension.  Sonographer:     Sherrie Sport Referring Phys:  [id number] Athena Masse Diagnosing Phys: Kate Sable MD IMPRESSIONS  1. Left ventricular ejection fraction, by estimation, is 55 to 60%. The left ventricle has normal function. The left ventricle has no regional wall motion  abnormalities. Left ventricular diastolic parameters are consistent with Grade I diastolic dysfunction (impaired relaxation).  2. Right ventricular systolic function is normal. The right ventricular size is normal.  3. The mitral valve is normal in structure. No evidence of mitral valve regurgitation.  4. The aortic valve was not well visualized. Aortic valve regurgitation is not visualized.  5. The inferior vena cava is normal in size with greater than 50% respiratory variability, suggesting right atrial pressure of 3 mmHg. FINDINGS  Left Ventricle: Left ventricular ejection fraction, by estimation, is 55 to 60%. The left ventricle has normal function. The left ventricle has no regional wall motion abnormalities. The left ventricular internal cavity size was normal in size. There is  no left ventricular hypertrophy. Left ventricular diastolic parameters are consistent with Grade I diastolic dysfunction (impaired relaxation). Right Ventricle: The right ventricular size is normal. No increase in right ventricular wall thickness. Right ventricular systolic function is normal. Left Atrium: Left atrial size was normal in size. Right Atrium: Right atrial size was normal in size. Pericardium: There is no evidence of pericardial effusion. Mitral Valve: The mitral valve is normal in structure. No evidence of mitral valve regurgitation. MV peak gradient, 3.2 mmHg. The mean mitral valve gradient is 2.0 mmHg. Tricuspid Valve: The tricuspid valve is normal in structure. Tricuspid valve regurgitation is not demonstrated. Aortic Valve: The aortic valve was not well visualized. Aortic valve regurgitation is not visualized. Aortic valve mean gradient measures 3.0 mmHg. Aortic valve peak gradient measures 5.7 mmHg. Aortic valve area, by VTI measures 3.52 cm. Pulmonic Valve: The pulmonic valve was not well visualized. Pulmonic valve regurgitation is not visualized. Aorta: The aortic root is normal in size and structure. Venous: The  inferior vena cava is normal in size with greater than 50% respiratory variability, suggesting right atrial pressure of 3 mmHg. IAS/Shunts: No atrial level shunt detected by color flow Doppler.  LEFT VENTRICLE PLAX 2D LVIDd:         4.40 cm   Diastology LVIDs:         2.85 cm   LV e' medial:    5.55 cm/s LV PW:         1.15 cm   LV E/e' medial:  10.7 LV IVS:        1.15 cm   LV e' lateral:   7.29 cm/s  LVOT diam:     2.10 cm   LV E/e' lateral: 8.2 LV SV:         70 LV SV Index:   35 LVOT Area:     3.46 cm  RIGHT VENTRICLE RV S prime:     17.10 cm/s TAPSE (M-mode): 2.5 cm LEFT ATRIUM             Index        RIGHT ATRIUM           Index LA diam:        2.85 cm 1.41 cm/m   RA Area:     12.40 cm LA Vol (A2C):   50.8 ml 25.14 ml/m  RA Volume:   30.60 ml  15.14 ml/m LA Vol (A4C):   37.9 ml 18.75 ml/m LA Biplane Vol: 45.5 ml 22.51 ml/m  AORTIC VALVE                    PULMONIC VALVE AV Area (Vmax):    2.77 cm     PV Vmax:        0.70 m/s AV Area (Vmean):   2.56 cm     PV Vmean:       46.200 cm/s AV Area (VTI):     3.52 cm     PV VTI:         0.126 m AV Vmax:           119.00 cm/s  PV Peak grad:   1.9 mmHg AV Vmean:          84.100 cm/s  PV Mean grad:   1.0 mmHg AV VTI:            0.199 m      RVOT Peak grad: 3 mmHg AV Peak Grad:      5.7 mmHg AV Mean Grad:      3.0 mmHg LVOT Vmax:         95.30 cm/s LVOT Vmean:        62.200 cm/s LVOT VTI:          0.202 m LVOT/AV VTI ratio: 1.02  AORTA Ao Root diam: 3.37 cm MITRAL VALVE               TRICUSPID VALVE MV Area (PHT): 3.95 cm    TR Peak grad:   18.8 mmHg MV Area VTI:   3.46 cm    TR Vmax:        217.00 cm/s MV Peak grad:  3.2 mmHg MV Mean grad:  2.0 mmHg    SHUNTS MV Vmax:       0.89 m/s    Systemic VTI:  0.20 m MV Vmean:      65.0 cm/s   Systemic Diam: 2.10 cm MV Decel Time: 192 msec    Pulmonic VTI:  0.148 m MV E velocity: 59.60 cm/s MV A velocity: 71.60 cm/s MV E/A ratio:  0.83 Kate Sable MD Electronically signed by Kate Sable MD Signature  Date/Time: 07/03/2021/2:50:27 PM    Final    CT VENOGRAM HEAD  Result Date: 07/02/2021 CLINICAL DATA:  CVA/TIA symptoms. Dizziness and bilateral leg weakness. Possible dural venous thrombosis on MRI today. EXAM: CT VENOGRAM HEAD TECHNIQUE: Venographic phase images of the brain were obtained following the administration of intravenous contrast. Multiplanar reformats and maximum intensity projections were generated. RADIATION DOSE REDUCTION: This exam was performed according to the departmental dose-optimization program which includes automated exposure control, adjustment of the  mA and/or kV according to patient size and/or use of iterative reconstruction technique. CONTRAST:  59m OMNIPAQUE IOHEXOL 350 MG/ML SOLN COMPARISON:  MRI head with contrast 07/02/2021.  CT head 07/02/2021 FINDINGS: Head CT not repeated. Postcontrast imaging of the brain reveals no enhancing lesion identified. Right transverse sinus is dominant. There is an irregular filling defect in the right transverse sinus as noted on MRI. This is most consistent with thrombus and could be acute or chronic. Hypoplastic left transverse sinus without filling defect. Superior sagittal sinus is patent. IMPRESSION: Filling defect right transverse sinus approximally 2.5 cm in length compatible with thrombus. This could be acute or chronic. This correlates with the MRI earlier today. Electronically Signed   By: CFranchot GalloM.D.   On: 07/02/2021 19:41     CODE STATUS:     Code Status Orders  (From admission, onward)           Start     Ordered   07/02/21 2251  Full code  Continuous        07/02/21 2251           Code Status History     Date Active Date Inactive Code Status Order ID Comments User Context   02/13/2020 2113 02/15/2020 2219 Full Code [id number] PLenore Cordia MD ED        TOTAL TIME TAKING CARE OF THIS PATIENT: 35 minutes.    SFritzi MandesM.D  [facility]    CC: Primary care physician; KGladstone Lighter MD

## 2021-07-05 LAB — ANTIPHOSPHOLIPID SYNDROME PROF
Anticardiolipin IgG: <9 GPL U/mL (ref 0–14)
Anticardiolipin IgM: 17 MPL U/mL — ABNORMAL HIGH (ref 0–12)
DRVVT: 30.4 s (ref 0.0–47.0)
PTT Lupus Anticoagulant: 49.8 s — ABNORMAL HIGH (ref 0.0–43.5)

## 2021-07-05 LAB — PTT-LA MIX: PTT-LA Mix: 44 s — ABNORMAL HIGH (ref 0.0–40.5)

## 2021-07-05 LAB — HEXAGONAL PHASE PHOSPHOLIPID: Hexagonal Phase Phospholipid: 3 s (ref 0–11)

## 2021-07-08 LAB — FACTOR 5 LEIDEN

## 2021-07-09 ENCOUNTER — Encounter: Payer: Self-pay | Admitting: *Deleted

## 2021-07-15 NOTE — Progress Notes (Signed)
[place]  Telephone:([phone] [phone]  ID: Breanna Ramos OB: [birth year]  MR#: [id number]  FYT#:[id number]  Patient Care Team: Gladstone Lighter, MD as PCP - General (Internal Medicine) Lloyd Huger, MD as Consulting Physician (Hematology and Oncology)  CHIEF COMPLAINT: Cerebral sinus thrombosis.  INTERVAL HISTORY: Patient is a 51 year old female who was recently in the hospital with TIA like symptoms and was noted to have a cerebral sinus thrombus.  She has no personal history of DVT or blood clot.  She reports both of her parents had multiple CVAs.  She currently feels well and is back to her baseline.  She has no neurologic complaints.  She denies any recent fevers.  She has good appetite and denies weight loss.  She has no chest pain, shortness of breath, cough, or hemoptysis.  She denies any nausea, vomiting, constipation, or diarrhea.  She has no urinary complaints.  Patient offers no further specific complaints today.  REVIEW OF SYSTEMS:   Review of Systems  Constitutional: Negative.  Negative for fever, malaise/fatigue and weight loss.  Respiratory: Negative.  Negative for cough, hemoptysis and shortness of breath.   Cardiovascular: Negative.  Negative for chest pain and leg swelling.  Gastrointestinal: Negative.  Negative for abdominal pain.  Genitourinary: Negative.  Negative for dysuria.  Musculoskeletal: Negative.  Negative for back pain.  Skin: Negative.  Negative for rash.  Neurological:  Negative for dizziness, speech change, focal weakness, weakness and headaches.  Psychiatric/Behavioral:  The patient is not nervous/anxious.    As per HPI. Otherwise, a complete review of systems is negative.  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   Nicotine dependence    Normocytic anemia    OSA on CPAP     PAST SURGICAL HISTORY: Past Surgical History:  Procedure Laterality Date   BACK SURGERY     x 2   CARDIAC ELECTROPHYSIOLOGY STUDY AND ABLATION     CHOLECYSTECTOMY     COLONOSCOPY WITH PROPOFOL N/A 03/07/2020   Procedure: COLONOSCOPY WITH PROPOFOL;  Surgeon: Lin Landsman, MD;  Location: [facility] ENDOSCOPY;  Service: Gastroenterology;  Laterality: N/A;   FRACTURE SURGERY     Left Ankle    FAMILY HISTORY: Family History  Problem Relation Age of Onset   Stroke Mother    Diabetes Mother    Heart disease Mother    Breast cancer Mother 50   Stroke Father     ADVANCED DIRECTIVES (Y/N):  N  HEALTH MAINTENANCE: Social History   Tobacco Use   Smoking status: Every Day    Packs/day: 0.25    Types: Cigarettes   Smokeless tobacco: Never  Substance Use Topics   Alcohol use: Yes    Comment: ~1 beer/week   Drug use: Never     Colonoscopy:  PAP:  Bone density:  Lipid panel:  Allergies  Allergen Reactions   Codeine Other (See Comments)    Cough syrup with codeine caused hyperactivity    Current Outpatient Medications  Medication Sig Dispense Refill   apixaban (ELIQUIS) 5 MG TABS tablet  Take 2 tablets (10 mg total) by mouth 2 (two) times daily. From 07/10/2021 take 5 mg (1 tab) twice a day 60 tablet 3   ascorbic acid (VITAMIN C) 500 MG tablet Take 500 mg by mouth daily.      atorvastatin (LIPITOR) 80 MG tablet Take 1 tablet (80 mg total) by mouth daily. 30 tablet 3   bisoprolol (ZEBETA) 10 MG tablet Take 10 mg by mouth daily.     buPROPion (WELLBUTRIN XL) 300 MG 24 hr tablet Take 300 mg by mouth daily.      cabergoline (DOSTINEX) 0.5 MG tablet Take 0.5 mg by mouth 2 (two) times a week. Tuesday/Saturday      celecoxib (CELEBREX) 200 MG capsule Take 200 mg by mouth at bedtime.      Cholecalciferol 50 MCG (2000 UT) CAPS Take 2,000 Units by mouth daily.      cyclobenzaprine (FLEXERIL) 10 MG tablet Take 10 mg by mouth 3 (three) times daily as needed for muscle spasms.     diltiazem (TIAZAC) 240 MG 24 hr capsule Take 240 mg by mouth daily.      FLUoxetine (PROZAC) 40 MG capsule Take 40 mg by mouth daily.     gabapentin (NEURONTIN) 300 MG capsule Take 300 mg by mouth daily.      loratadine (CLARITIN) 10 MG tablet Take 10 mg by mouth daily.      Magnesium Oxide 500 MG TABS Take 1 tablet by mouth daily.     Melatonin 5 MG CAPS Take 5 mg by mouth at bedtime.      mesalamine (CANASA) 1000 MG suppository Place 1 suppository (1,000 mg total) rectally at bedtime. (Patient taking differently: Place 1,000 mg rectally at bedtime as needed.) 30 suppository 1   metFORMIN (GLUCOPHAGE) 500 MG tablet Take 1,000 mg by mouth 2 (two) times daily with a meal.     Multiple Vitamin (MULTI-VITAMIN) tablet Take 1 tablet by mouth daily.     ondansetron (ZOFRAN) 4 MG tablet Take 4 mg by mouth every 12 (twelve) hours as needed for nausea or vomiting.     oxyCODONE-acetaminophen (PERCOCET) 10-325 MG tablet Take 1 tablet by mouth every 6 (six) hours as needed for pain.      pantoprazole (PROTONIX) 40 MG tablet Take 1 tablet (40 mg total) by mouth 2 (two) times daily before a meal. 180 tablet 3   venlafaxine XR (EFFEXOR-XR) 75 MG 24 hr capsule Take 75 mg by mouth daily.     No current facility-administered medications for this visit.    OBJECTIVE: Vitals:   07/16/21 1336  Temp: 98.6 F (37 C)     Body mass index is 31.67 kg/m.    ECOG FS:0 - Asymptomatic  General: Well-developed, well-nourished, no acute distress. Eyes: Pink conjunctiva, anicteric sclera. HEENT: Normocephalic, moist mucous membranes. Lungs: No audible wheezing or coughing. Heart: Regular rate and rhythm. Abdomen: Soft, nontender, no obvious  distention. Musculoskeletal: No edema, cyanosis, or clubbing. Neuro: Alert, answering all questions appropriately. Cranial nerves grossly intact. Skin: No rashes or petechiae noted. Psych: Normal affect. Lymphatics: No cervical, calvicular, axillary or inguinal LAD.   LAB RESULTS:  Lab Results  Component Value Date   NA 136 07/02/2021   K 3.9 07/02/2021   CL 101 07/02/2021   CO2 28 07/02/2021   GLUCOSE 102 (H) 07/02/2021   BUN 18 07/02/2021   CREATININE 0.67 07/02/2021   CALCIUM 9.2 07/02/2021   PROT 7.0 02/13/2020   ALBUMIN 4.0 02/13/2020   AST 20  02/13/2020   ALT 25 02/13/2020   ALKPHOS 80 02/13/2020   BILITOT 0.6 02/13/2020   GFRNONAA >60 07/02/2021   GFRAA >60 02/15/2020    Lab Results  Component Value Date   WBC 7.6 07/04/2021   HGB 13.3 07/04/2021   HCT 39.4 07/04/2021   MCV 89.5 07/04/2021   PLT 316 07/04/2021     STUDIES: CT HEAD WO CONTRAST  Result Date: 07/02/2021 CLINICAL DATA:  Dizziness, non-specific EXAM: CT HEAD WITHOUT CONTRAST TECHNIQUE: Contiguous axial images were obtained from the base of the skull through the vertex without intravenous contrast. RADIATION DOSE REDUCTION: This exam was performed according to the departmental dose-optimization program which includes automated exposure control, adjustment of the mA and/or kV according to patient size and/or use of iterative reconstruction technique. COMPARISON:  None. FINDINGS: Brain: There is no acute intracranial hemorrhage, mass effect, or edema. Gray-white differentiation is preserved. There is no extra-axial fluid collection. Ventricles and sulci are within normal limits in size and configuration. Vascular: No hyperdense vessel or unexpected calcification. Skull: Calvarium is unremarkable. Sinuses/Orbits: No acute finding. Other: None. IMPRESSION: No acute intracranial abnormality. Electronically Signed   By: Macy Mis M.D.   On: 07/02/2021 14:53   MR BRAIN W WO CONTRAST  Result Date:  07/02/2021 CLINICAL DATA:  Provided history: CVA/TIA symptoms. History of microprolactinoma. Additional history provided: Patient reports dizziness and bilateral leg weakness since 9 a.m. History of microprolactinoma with surgery in 2013. EXAM: MRI HEAD WITHOUT AND WITH CONTRAST TECHNIQUE: Multiplanar, multiecho pulse sequences of the brain and surrounding structures were obtained without and with intravenous contrast. CONTRAST:  19m GADAVIST GADOBUTROL 1 MMOL/ML IV SOLN COMPARISON:  Head CT 07/02/2021. FINDINGS: Mild intermittent motion degradation. Brain: Dedicated pituitary imaging reveals a partially empty sella turcica. No focal pituitary lesion is identified. The suprasellar cistern is patent. The pituitary stalk is midline and is not abnormally thickened. Cerebral volume is normal. No cortical encephalomalacia is identified. There is no acute infarct. No chronic intracranial blood products. No extra-axial fluid collection. No midline shift. Vascular: Maintained flow voids within the proximal large arterial vessels. Possible small developmental venous anomaly within the periventricular right frontal lobe (anatomic variant). Curvilinear focus of hypoenhancement within the distal transverse and proximal sigmoid dural venous sinuses on the right (for instance as seen on series 26, image 37) (series 100, image 169). This may reflect flow artifact or a small nonocclusive thrombus. Skull and upper cervical spine: No focal suspicious marrow lesion. Susceptibility artifact arising from cervical spinal fusion hardware. Incompletely assessed cervical spondylosis. Sinuses/Orbits: Visualized orbits show no acute finding. Small mucous retention cyst within the left maxillary sinus. Trace mucosal thickening within the bilateral ethmoid sinuses. Impression #3 was called by telephone at the time of interpretation on 07/02/2021 at 5:38 pm to provider Dr. IEllender Hose who verbally acknowledged these results. IMPRESSION: 1. No evidence  of acute infarct. 2. No focal pituitary lesion is appreciated. 3. Curvilinear focus of hypoenhancement within the right transverse and sigmoid dural venous sinuses, which may reflect flow artifact or a small non-occlusive thrombus. CT venography of the head may be helpful for further characterization. 4. Partially empty sella turcica. While this finding often reflects incidental anatomic variation, it can also be associated with idiopathic intracranial hypertension (pseudotumor cerebri). 5. Otherwise unremarkable MRI appearance of the brain. 6. Mild paranasal sinus disease, as described. 7. Incompletely assessed cervical spondylosis and postsurgical changes. Electronically Signed   By: KKellie SimmeringD.O.   On: 07/02/2021 17:40   [place]otid Bilateral  Result Date: 07/03/2021 CLINICAL DATA:  Transient ischemic attack EXAM: BILATERAL CAROTID DUPLEX ULTRASOUND TECHNIQUE: Pearline Cables scale imaging, color Doppler and duplex ultrasound were performed of bilateral carotid and vertebral arteries in the neck. COMPARISON:  None. FINDINGS: Criteria: Quantification of carotid stenosis is based on velocity parameters that correlate the residual internal carotid diameter with NASCET-based stenosis levels, using the diameter of the distal internal carotid lumen as the denominator for stenosis measurement. The following velocity measurements were obtained: RIGHT ICA: 50/23 cm/sec CCA: 51/76 cm/sec SYSTOLIC ICA/CCA RATIO:  0.8 ECA: 71 cm/sec LEFT ICA: 76/34 cm/sec CCA: 16/07 cm/sec SYSTOLIC ICA/CCA RATIO:  1.1 ECA: 70 cm/sec RIGHT CAROTID ARTERY: No significant atherosclerotic plaque RIGHT VERTEBRAL ARTERY:  Antegrade LEFT CAROTID ARTERY:  No significant atherosclerotic plaque LEFT VERTEBRAL ARTERY:  Antegrade IMPRESSION: No evidence of hemodynamically significant stenosis involving the carotid bifurcations bilaterally. Electronically Signed   By: Fidela Salisbury M.D.   On: 07/03/2021 04:12   ECHOCARDIOGRAM COMPLETE  Result Date:  07/03/2021    ECHOCARDIOGRAM REPORT   Patient Name:   ANAB VIVAR Date of Exam: 07/03/2021 Medical Rec #:  [id number]    Height:       66.0 in Accession #:    [id number]   Weight:       205.0 lb Date of Birth:  [birth year]    BSA:          2.021 m Patient Age:    33 years     BP:           116/87 mmHg Patient Gender: F            HR:           93 bpm. Exam Location:  [facility] Procedure: 2D Echo, Cardiac Doppler and Color Doppler Indications:     Stroke I63.9  History:         Patient has no prior history of Echocardiogram examinations.                  Risk Factors:Diabetes and Hypertension.  Sonographer:     Sherrie Sport Referring Phys:  [id number] Athena Masse Diagnosing Phys: Kate Sable MD IMPRESSIONS  1. Left ventricular ejection fraction, by estimation, is 55 to 60%. The left ventricle has normal function. The left ventricle has no regional wall motion abnormalities. Left ventricular diastolic parameters are consistent with Grade I diastolic dysfunction (impaired relaxation).  2. Right ventricular systolic function is normal. The right ventricular size is normal.  3. The mitral valve is normal in structure. No evidence of mitral valve regurgitation.  4. The aortic valve was not well visualized. Aortic valve regurgitation is not visualized.  5. The inferior vena cava is normal in size with greater than 50% respiratory variability, suggesting right atrial pressure of 3 mmHg. FINDINGS  Left Ventricle: Left ventricular ejection fraction, by estimation, is 55 to 60%. The left ventricle has normal function. The left ventricle has no regional wall motion abnormalities. The left ventricular internal cavity size was normal in size. There is  no left ventricular hypertrophy. Left ventricular diastolic parameters are consistent with Grade I diastolic dysfunction (impaired relaxation). Right Ventricle: The right ventricular size is normal. No increase in right ventricular wall thickness. Right ventricular systolic function is  normal. Left Atrium: Left atrial size was normal in size. Right Atrium: Right atrial size was normal in size. Pericardium: There is no evidence of pericardial effusion. Mitral Valve: The mitral valve is normal in structure. No evidence of mitral valve regurgitation.  MV peak gradient, 3.2 mmHg. The mean mitral valve gradient is 2.0 mmHg. Tricuspid Valve: The tricuspid valve is normal in structure. Tricuspid valve regurgitation is not demonstrated. Aortic Valve: The aortic valve was not well visualized. Aortic valve regurgitation is not visualized. Aortic valve mean gradient measures 3.0 mmHg. Aortic valve peak gradient measures 5.7 mmHg. Aortic valve area, by VTI measures 3.52 cm. Pulmonic Valve: The pulmonic valve was not well visualized. Pulmonic valve regurgitation is not visualized. Aorta: The aortic root is normal in size and structure. Venous: The inferior vena cava is normal in size with greater than 50% respiratory variability, suggesting right atrial pressure of 3 mmHg. IAS/Shunts: No atrial level shunt detected by color flow Doppler.  LEFT VENTRICLE PLAX 2D LVIDd:         4.40 cm   Diastology LVIDs:         2.85 cm   LV e' medial:    5.55 cm/s LV PW:         1.15 cm   LV E/e' medial:  10.7 LV IVS:        1.15 cm   LV e' lateral:   7.29 cm/s LVOT diam:     2.10 cm   LV E/e' lateral: 8.2 LV SV:         70 LV SV Index:   35 LVOT Area:     3.46 cm  RIGHT VENTRICLE RV S prime:     17.10 cm/s TAPSE (M-mode): 2.5 cm LEFT ATRIUM             Index        RIGHT ATRIUM           Index LA diam:        2.85 cm 1.41 cm/m   RA Area:     12.40 cm LA Vol (A2C):   50.8 ml 25.14 ml/m  RA Volume:   30.60 ml  15.14 ml/m LA Vol (A4C):   37.9 ml 18.75 ml/m LA Biplane Vol: 45.5 ml 22.51 ml/m  AORTIC VALVE                    PULMONIC VALVE AV Area (Vmax):    2.77 cm     PV Vmax:        0.70 m/s AV Area (Vmean):   2.56 cm     PV Vmean:       46.200 cm/s AV Area (VTI):     3.52 cm     PV VTI:         0.126 m AV Vmax:            119.00 cm/s  PV Peak grad:   1.9 mmHg AV Vmean:          84.100 cm/s  PV Mean grad:   1.0 mmHg AV VTI:            0.199 m      RVOT Peak grad: 3 mmHg AV Peak Grad:      5.7 mmHg AV Mean Grad:      3.0 mmHg LVOT Vmax:         95.30 cm/s LVOT Vmean:        62.200 cm/s LVOT VTI:          0.202 m LVOT/AV VTI ratio: 1.02  AORTA Ao Root diam: 3.37 cm MITRAL VALVE               TRICUSPID VALVE MV Area (PHT): 3.95  cm    TR Peak grad:   18.8 mmHg MV Area VTI:   3.46 cm    TR Vmax:        217.00 cm/s MV Peak grad:  3.2 mmHg MV Mean grad:  2.0 mmHg    SHUNTS MV Vmax:       0.89 m/s    Systemic VTI:  0.20 m MV Vmean:      65.0 cm/s   Systemic Diam: 2.10 cm MV Decel Time: 192 msec    Pulmonic VTI:  0.148 m MV E velocity: 59.60 cm/s MV A velocity: 71.60 cm/s MV E/A ratio:  0.83 Kate Sable MD Electronically signed by Kate Sable MD Signature Date/Time: 07/03/2021/2:50:27 PM    Final    CT VENOGRAM HEAD  Result Date: 07/02/2021 CLINICAL DATA:  CVA/TIA symptoms. Dizziness and bilateral leg weakness. Possible dural venous thrombosis on MRI today. EXAM: CT VENOGRAM HEAD TECHNIQUE: Venographic phase images of the brain were obtained following the administration of intravenous contrast. Multiplanar reformats and maximum intensity projections were generated. RADIATION DOSE REDUCTION: This exam was performed according to the departmental dose-optimization program which includes automated exposure control, adjustment of the mA and/or kV according to patient size and/or use of iterative reconstruction technique. CONTRAST:  58m OMNIPAQUE IOHEXOL 350 MG/ML SOLN COMPARISON:  MRI head with contrast 07/02/2021.  CT head 07/02/2021 FINDINGS: Head CT not repeated. Postcontrast imaging of the brain reveals no enhancing lesion identified. Right transverse sinus is dominant. There is an irregular filling defect in the right transverse sinus as noted on MRI. This is most consistent with thrombus and could be acute or chronic.  Hypoplastic left transverse sinus without filling defect. Superior sagittal sinus is patent. IMPRESSION: Filling defect right transverse sinus approximally 2.5 cm in length compatible with thrombus. This could be acute or chronic. This correlates with the MRI earlier today. Electronically Signed   By: CFranchot GalloM.D.   On: 07/02/2021 19:41    ASSESSMENT: Cerebral sinus thrombosis.  PLAN:    Cerebral sinus thrombosis: CT venogram of the head on July 02, 2021 reviewed independently and reported as above with a 2.5 cm filling defect in the right transverse sinus consistent with thrombus, although unclear if this is acute or chronic.  Patient was placed on Eliquis and is tolerating treatment well.  Partial hypercoagulable work-up while inpatient was negative.  Will complete work-up today.  Patient will have video-assisted telemedicine visit in 4 weeks to discuss the results and whether or not to remain on anticoagulation lifelong.  I spent a total of 45 minutes reviewing chart data, face-to-face evaluation with the patient, counseling and coordination of care as detailed above.   Patient expressed understanding and was in agreement with this plan. She also understands that She can call clinic at any time with any questions, concerns, or complaints.    TLloyd Huger MD   07/17/2021 9:13 AM

## 2021-07-16 ENCOUNTER — Inpatient Hospital Stay: Payer: BLUE CROSS/BLUE SHIELD | Attending: Oncology | Admitting: Oncology

## 2021-07-16 ENCOUNTER — Inpatient Hospital Stay: Payer: BLUE CROSS/BLUE SHIELD

## 2021-07-16 ENCOUNTER — Other Ambulatory Visit: Payer: Self-pay

## 2021-07-16 ENCOUNTER — Encounter: Payer: Self-pay | Admitting: Oncology

## 2021-07-16 VITALS — Temp 98.6°F | Wt 196.2 lb

## 2021-07-16 DIAGNOSIS — I10 Essential (primary) hypertension: Secondary | ICD-10-CM | POA: Diagnosis not present

## 2021-07-16 DIAGNOSIS — Z7901 Long term (current) use of anticoagulants: Secondary | ICD-10-CM | POA: Insufficient documentation

## 2021-07-16 DIAGNOSIS — G08 Intracranial and intraspinal phlebitis and thrombophlebitis: Secondary | ICD-10-CM | POA: Insufficient documentation

## 2021-07-16 DIAGNOSIS — E119 Type 2 diabetes mellitus without complications: Secondary | ICD-10-CM | POA: Diagnosis not present

## 2021-07-16 DIAGNOSIS — Z803 Family history of malignant neoplasm of breast: Secondary | ICD-10-CM | POA: Diagnosis not present

## 2021-07-16 DIAGNOSIS — F17210 Nicotine dependence, cigarettes, uncomplicated: Secondary | ICD-10-CM | POA: Insufficient documentation

## 2021-07-16 NOTE — Progress Notes (Signed)
Patient and her Fiance got let go from their job this morning. Patient's mom passed away this past week. Patient Is really worried about finances and her own health. Patient states that she has always has a high platelet count since 2015. I told patient about the resources that we offer here at the cancer center.

## 2021-07-17 LAB — HOMOCYSTEINE: Homocysteine: 5.4 umol/L (ref 0.0–14.5)

## 2021-07-17 LAB — ANTITHROMBIN III: AntiThromb III Func: 115 % (ref 75–120)

## 2021-07-18 LAB — CARDIOLIPIN ANTIBODIES, IGG, IGM, IGA
Anticardiolipin IgA: <9 APL U/mL (ref 0–11)
Anticardiolipin IgG: <9 GPL U/mL (ref 0–14)
Anticardiolipin IgM: <9 MPL U/mL (ref 0–12)

## 2021-07-18 LAB — BETA-2-GLYCOPROTEIN I ABS, IGG/M/A
Beta-2 Glyco I IgG: <9 GPI IgG units (ref 0–20)
Beta-2-Glycoprotein I IgA: <9 GPI IgA units (ref 0–25)
Beta-2-Glycoprotein I IgM: <9 GPI IgM units (ref 0–32)

## 2021-07-18 LAB — LUPUS ANTICOAGULANT PANEL
DRVVT: 43.1 s (ref 0.0–47.0)
PTT Lupus Anticoagulant: 34.5 s (ref 0.0–43.5)

## 2021-07-22 LAB — PROTHROMBIN GENE MUTATION

## 2021-07-31 ENCOUNTER — Encounter (INDEPENDENT_AMBULATORY_CARE_PROVIDER_SITE_OTHER): Payer: Self-pay

## 2021-08-11 NOTE — Progress Notes (Signed)
?[place]  ?[phone] [phone] ? ?ID: Breanna Ramos OB: 11/20/1970  MR#: [id number]  XTG#:[id number] ? ?Patient Care Team: ?Gladstone Lighter, MD as PCP - General (Internal Medicine) ?Lloyd Huger, MD as Consulting Physician (Hematology and Oncology) ? ?I connected with Breanna Ramos on 08/15/21 at  3:30 PM EDT by video enabled telemedicine visit and verified that I am speaking with the correct person using two identifiers.  ? ?I discussed the limitations, risks, security and privacy concerns of performing an evaluation and management service by telemedicine and the availability of in-person appointments. I also discussed with the patient that there may be a patient responsible charge related to this service. The patient expressed understanding and agreed to proceed.  ? ?Other persons participating in the visit and their role in the encounter: Patient, MD. ? ?Patient?s location: Home. ?Provider?s location: Clinic. ? ?CHIEF COMPLAINT: Cerebral sinus thrombosis. ? ?INTERVAL HISTORY: Patient agreed to video assisted telemedicine visit for further evaluation and discussion of her laboratory results.  She currently feels well and is asymptomatic.  She is tolerating Eliquis without significant side effects.  She has no neurologic complaints.  She denies any recent fevers.  She has good appetite and denies weight loss.  She has no chest pain, shortness of breath, cough, or hemoptysis.  She denies any nausea, vomiting, constipation, or diarrhea.  She has no urinary complaints.  Patient offers no specific complaints today. ? ?REVIEW OF SYSTEMS:   ?Review of Systems  ?Constitutional: Negative.  Negative for fever, malaise/fatigue and weight loss.  ?Respiratory: Negative.  Negative for cough, hemoptysis and shortness of breath.   ?Cardiovascular: Negative.  Negative for chest pain and leg swelling.  ?Gastrointestinal: Negative.  Negative for abdominal pain.  ?Genitourinary:  Negative.  Negative for dysuria.  ?Musculoskeletal: Negative.  Negative for back pain.  ?Skin: Negative.  Negative for rash.  ?Neurological:  Negative for dizziness, speech change, focal weakness, weakness and headaches.  ?Psychiatric/Behavioral:  The patient is not nervous/anxious.   ? ?As per HPI. Otherwise, a complete review of systems is negative. ? ?PAST MEDICAL HISTORY: ?Past Medical History:  ?Diagnosis Date  ? Acute focal neurologic deficit with complete resolution   ? Arthritis   ? Carpal tunnel syndrome   ? Cerebral venous sinus thrombosis   ? Chronic, continuous use of opioids   ? Depression   ? Diabetes mellitus without complication ([place])   ? Fibromyalgia   ? H/O cardiac radiofrequency ablation   ? History of rectal bleeding 08/2020  ? Hypertension   ? 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 Nicotine dependence   ? Normocytic anemia   ? OSA on CPAP   ? ? ?PAST SURGICAL HISTORY: ?Past Surgical History:  ?Procedure Laterality Date  ? BACK SURGERY    ? x 2  ? CARDIAC ELECTROPHYSIOLOGY STUDY AND ABLATION    ? CHOLECYSTECTOMY    ? COLONOSCOPY WITH PROPOFOL N/A 03/07/2020  ? Procedure: COLONOSCOPY WITH PROPOFOL;  Surgeon: Lin Landsman, MD;  Location: [facility] ENDOSCOPY;  Service: Gastroenterology;  Laterality: N/A;  ? FRACTURE SURGERY    ? Left Ankle  ? ? ?FAMILY HISTORY: ?Family History  ?Problem Relation Age of Onset  ? Stroke Mother   ? Diabetes Mother   ? Heart disease Mother   ? Breast cancer Mother 54  ? Stroke Father   ? ? ?ADVANCED DIRECTIVES (Y/N):  N ? ?HEALTH MAINTENANCE: ?Social History  ? ?Tobacco Use  ? Smoking status: Every Day  ?  Packs/day: 0.25  ?  Types: Cigarettes  ? Smokeless tobacco: Never  ?Substance Use Topics  ? Alcohol use: Yes  ?  Comment: ~1 beer/week  ? Drug use: Never  ? ? ? Colonoscopy: ? PAP: ? Bone density: ? Lipid panel: ? ?Allergies  ?Allergen Reactions  ? Codeine Other (See Comments)  ?  Cough syrup with codeine caused hyperactivity  ? ? ?Current Outpatient Medications   ?Medication Sig Dispense Refill  ? apixaban (ELIQUIS) 5 MG TABS tablet Take 2 tablets (10 mg total) by mouth 2 (two) times daily. From 07/10/2021 take 5 mg (1 tab) twice a day 60 tablet 3  ? ascorbic acid (VITAMIN C) 500 MG tablet Take 500 mg by mouth daily.     ? atorvastatin (LIPITOR) 80 MG tablet Take 1 tablet (80 mg total) by mouth daily. 30 tablet 3  ? bisoprolol (ZEBETA) 10 MG tablet Take 10 mg by mouth daily.    ? cabergoline (DOSTINEX) 0.5 MG tablet Take 0.5 mg by mouth 2 (two) times a week. Tuesday/Saturday    ? celecoxib (CELEBREX) 200 MG capsule Take 200 mg by mouth at bedtime.     ? Cholecalciferol 50 MCG (2000 UT) CAPS Take 2,000 Units by mouth daily.     ? clonazePAM (KLONOPIN) 0.5 MG tablet Take 0.5 mg by mouth 2 (two) times daily as needed.    ? cyclobenzaprine (FLEXERIL) 10 MG tablet Take 10 mg by mouth 3 (three) times daily as needed for muscle spasms.    ? diltiazem (TIAZAC) 240 MG 24 hr capsule Take 240 mg by mouth daily.     ? FLUoxetine (PROZAC) 40 MG capsule Take 40 mg by mouth daily.    ? gabapentin (NEURONTIN) 300 MG capsule Take 300 mg by mouth daily.     ? loratadine (CLARITIN) 10 MG tablet Take 10 mg by mouth daily.     ? Magnesium Oxide 500 MG TABS Take 1 tablet by mouth daily.    ? Melatonin 5 MG CAPS Take 5 mg by mouth at bedtime.     ? metFORMIN (GLUCOPHAGE) 500 MG tablet Take 1,000 mg by mouth 2 (two) times daily with a meal.    ? Multiple Vitamin (MULTI-VITAMIN) tablet Take 1 tablet by mouth daily.    ? ondansetron (ZOFRAN) 4 MG tablet Take 4 mg by mouth every 12 (twelve) hours as needed for nausea or vomiting.    ? oxyCODONE-acetaminophen (PERCOCET) 10-325 MG tablet Take 1 tablet by mouth every 6 (six) hours as needed for pain.     ? pantoprazole (PROTONIX) 40 MG tablet Take 1 tablet (40 mg total) by mouth 2 (two) times daily before a meal. 180 tablet 3  ? ranitidine (ZANTAC) 75 MG tablet Take 75 mg by mouth daily as needed for heartburn. Pt takes for nausea prn    ? varenicline  (CHANTIX PAK) 0.5 MG X 11 & 1 MG X 42 tablet See admin instructions. follow package directions    ? venlafaxine XR (EFFEXOR-XR) 75 MG 24 hr capsule Take 75 mg by mouth daily.    ? buPROPion (WELLBUTRIN XL) 300 MG 24 hr tablet Take 300 mg by mouth daily.     ? mesalamine (CANASA) 1000 MG suppository Place 1 suppository (1,000 mg total) rectally at bedtime. (Patient not taking: Reported on 08/13/2021) 30 suppository 1  ? ?No current facility-administered medications for this visit.  ? ? ?OBJECTIVE: ?There were no vitals filed for this visit. ?   There is no height or weight on file to  calculate BMI.    ECOG FS:0 - Asymptomatic ? ?General: Well-developed, well-nourished, no acute distress. ?HEENT: Normocephalic. ?Neuro: Alert, answering all questions appropriately. Cranial nerves grossly intact. ?Psych: Normal affect. ? ? ?LAB RESULTS: ? ?Lab Results  ?Component Value Date  ? NA 136 07/02/2021  ? K 3.9 07/02/2021  ? CL 101 07/02/2021  ? CO2 28 07/02/2021  ? GLUCOSE 102 (H) 07/02/2021  ? BUN 18 07/02/2021  ? CREATININE 0.67 07/02/2021  ? CALCIUM 9.2 07/02/2021  ? PROT 7.0 02/13/2020  ? ALBUMIN 4.0 02/13/2020  ? AST 20 02/13/2020  ? ALT 25 02/13/2020  ? ALKPHOS 80 02/13/2020  ? BILITOT 0.6 02/13/2020  ? GFRNONAA >60 07/02/2021  ? GFRAA >60 02/15/2020  ? ? ?Lab Results  ?Component Value Date  ? WBC 7.6 07/04/2021  ? HGB 13.3 07/04/2021  ? HCT 39.4 07/04/2021  ? MCV 89.5 07/04/2021  ? PLT 316 07/04/2021  ? ? ? ?STUDIES: ?No results found. ? ?ASSESSMENT: Cerebral sinus thrombosis. ? ?PLAN:   ? ?Cerebral sinus thrombosis: CT venogram of the head on July 02, 2021 reviewed independently with a 2.5 cm filling defect in the right transverse sinus consistent with thrombus, although unclear if this was acute or chronic.  Patient was placed on Eliquis and is tolerating treatment well.  Full hypercoag work-up has been completed and is negative.  No further intervention is needed at this time.  Have recommended a full year of  anticoagulation completing in February 2024.  Patient will have video-assisted telemedicine visit at that time to further discuss whether lifelong anticoagulation is warranted.  ? ?I provided 30 minutes of face-to-face

## 2021-08-13 ENCOUNTER — Encounter: Payer: Self-pay | Admitting: Oncology

## 2021-08-13 ENCOUNTER — Other Ambulatory Visit: Payer: Self-pay

## 2021-08-13 ENCOUNTER — Inpatient Hospital Stay: Payer: BLUE CROSS/BLUE SHIELD | Attending: Oncology | Admitting: Oncology

## 2021-08-13 DIAGNOSIS — I10 Essential (primary) hypertension: Secondary | ICD-10-CM | POA: Diagnosis not present

## 2021-08-13 DIAGNOSIS — G08 Intracranial and intraspinal phlebitis and thrombophlebitis: Secondary | ICD-10-CM | POA: Diagnosis not present

## 2021-08-13 NOTE — Progress Notes (Signed)
Pt reports dizziness when changing from sitting to standing position. Denies any falls. C/o nausea. Also states she has had poor appetite d/t increased depressive mood. Finds it difficult to motivate herself to get out of bed. Denies SI. ?

## 2021-09-04 ENCOUNTER — Encounter (INDEPENDENT_AMBULATORY_CARE_PROVIDER_SITE_OTHER): Payer: Self-pay

## 2021-09-05 ENCOUNTER — Other Ambulatory Visit: Payer: Self-pay | Admitting: Gastroenterology

## 2021-10-02 ENCOUNTER — Other Ambulatory Visit: Payer: Self-pay | Admitting: Internal Medicine

## 2021-10-02 DIAGNOSIS — R7989 Other specified abnormal findings of blood chemistry: Secondary | ICD-10-CM

## 2021-10-08 ENCOUNTER — Ambulatory Visit
Admission: RE | Admit: 2021-10-08 | Discharge: 2021-10-08 | Disposition: A | Discharge status: Home / Self Care | Payer: BLUE CROSS/BLUE SHIELD | Source: Ambulatory Visit | Attending: Internal Medicine | Admitting: Internal Medicine

## 2021-10-08 DIAGNOSIS — R7989 Other specified abnormal findings of blood chemistry: Secondary | ICD-10-CM | POA: Insufficient documentation

## 2021-10-30 ENCOUNTER — Other Ambulatory Visit: Payer: Self-pay | Admitting: Physician Assistant

## 2021-10-30 DIAGNOSIS — G08 Intracranial and intraspinal phlebitis and thrombophlebitis: Secondary | ICD-10-CM

## 2021-12-09 ENCOUNTER — Ambulatory Visit: Payer: BLUE CROSS/BLUE SHIELD

## 2021-12-10 ENCOUNTER — Ambulatory Visit: Payer: BLUE CROSS/BLUE SHIELD

## 2021-12-10 ENCOUNTER — Ambulatory Visit
Admission: RE | Admit: 2021-12-10 | Discharge: 2021-12-10 | Disposition: A | Discharge status: Home / Self Care | Payer: BLUE CROSS/BLUE SHIELD | Source: Ambulatory Visit | Attending: Physician Assistant | Admitting: Physician Assistant

## 2021-12-10 DIAGNOSIS — G08 Intracranial and intraspinal phlebitis and thrombophlebitis: Secondary | ICD-10-CM | POA: Insufficient documentation

## 2021-12-31 ENCOUNTER — Encounter: Payer: Self-pay | Admitting: Oncology

## 2022-01-06 ENCOUNTER — Ambulatory Visit: Payer: BC Managed Care – PPO | Attending: Neurology | Admitting: Speech Pathology

## 2022-01-06 ENCOUNTER — Encounter: Payer: Self-pay | Admitting: Speech Pathology

## 2022-01-06 DIAGNOSIS — F8081 Childhood onset fluency disorder: Secondary | ICD-10-CM | POA: Diagnosis present

## 2022-01-06 DIAGNOSIS — R41841 Cognitive communication deficit: Secondary | ICD-10-CM | POA: Diagnosis present

## 2022-01-06 DIAGNOSIS — R4789 Other speech disturbances: Secondary | ICD-10-CM | POA: Insufficient documentation

## 2022-01-06 NOTE — Therapy (Signed)
OUTPATIENT SPEECH LANGUAGE PATHOLOGY EVALUATION   Patient Name: Breanna Ramos MRN: [id number] DOB:06/29/1970, 51 y.o., female Today's Date: 01/06/2022  PCP: Gladstone Lighter, MD  REFERRING PROVIDER: Gurney Maxin, MD/ Luella Cook, PA   End of Session - 01/06/22 1127     Visit Number 1    Number of Visits 5    Date for SLP Re-Evaluation 03/07/22   60 days to allow for scheduling   SLP Start Time 1106    SLP Stop Time  1200    SLP Time Calculation (min) 54 min    Activity Tolerance Patient tolerated treatment well             Past Medical History:  Diagnosis Date   Acute focal neurologic deficit with complete resolution    Arthritis    Carpal tunnel syndrome    Cerebral venous sinus thrombosis    Chronic, continuous use of opioids    Depression    Diabetes mellitus without complication (HCC)    Fibromyalgia    H/O cardiac radiofrequency ablation    History of rectal bleeding 08/2020   Hypertension    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   Nicotine dependence    Normocytic anemia    OSA on CPAP    Past Surgical History:  Procedure Laterality Date   BACK SURGERY     x 2   CARDIAC ELECTROPHYSIOLOGY STUDY AND ABLATION     CHOLECYSTECTOMY     COLONOSCOPY WITH PROPOFOL N/A 03/07/2020   Procedure: COLONOSCOPY WITH PROPOFOL;  Surgeon: Lin Landsman, MD;  Location: [facility] ENDOSCOPY;  Service: Gastroenterology;  Laterality: N/A;   FRACTURE SURGERY     Left Ankle   Patient Active Problem List   Diagnosis Date Noted   History of rectal bleeding, 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 08/2020 07/03/2021   Cerebral venous sinus thrombosis 07/02/2021   History of PSVT (paroxysmal supraventricular tachycardia)    OSA on CPAP    Nicotine dependence, cigarettes, uncomplicated    Acute focal neurologic deficit with complete resolution    Chronic pain    Chronic, continuous use of opioids    Rectal bleeding    On long term drug therapy 02/17/2020   Anxiety 02/17/2020   Depression  02/17/2020   Inflammation of sacroiliac joint ([place]) 02/17/2020   Low back pain 02/17/2020   Muscle atrophy 02/17/2020   Myofascial pain 02/17/2020   Neck pain 02/17/2020   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   SBO (small bowel obstruction) ([place]) 02/14/2020   Partial small bowel obstruction ([place]) 02/13/2020   Diabetes mellitus without complication (HCC)    Essential hypertension    Paroxysmal SVT (supraventricular tachycardia) ([place])     ONSET DATE: 12/23/2021 (referral date) pt reports rare intermittent difficulties prior to TIA in Feb 2023, with return to normal post TIA, and worsening in late May/June with stuttering  REFERRING DIAG: wordfinding difficulty  THERAPY DIAG:  Stuttering  Other speech disturbance  Cognitive communication deficit  Rationale for Evaluation and Treatment Rehabilitation  SUBJECTIVE:   SUBJECTIVE STATEMENT: "This doesn't happen all the time... sssssome days I talk almost like a normal person jusssst a little bit slower." Pt accompanied by: self  PERTINENT HISTORY: Patient is a 51 y.o. female presenting for evaluation of wordfinding difficulties. Past medical history is noted for TIA, pituitary adenoma, central venous sinus thrombosis, anxiety, depression, sleeping problem, tremors, and falls. Patient presented to [facility] ED 07/04/21 due to "transient spell of feeling foggy and dizzy along with lightheadedness and shaky hands. This was followed by trouble  getting words out," and underwent w/u for TIA. Pt reports her speech returned to baseline after this event. Reports onset of stuttering and difficulty with wordfinding, inconsistent but worsening since late May or early June 2023. Currently seeing psychiatry, several recent stressors including loss of her mother and job loss in that past 6 months.  DIAGNOSTIC FINDINGS: MRI 07/02/21: 1. No evidence of acute infarct.2. No focal pituitary lesion is appreciated. 3. Curvilinear focus of hypoenhancement within the right  transverse and sigmoid dural venous sinuses, which may reflect flow artifact or a small non-occlusive thrombus. CT venography of the head may be helpful for further characterization. 4. Partially empty sella turcica. While this finding often reflect incidental anatomic variation, it can also be associated with idiopathic intracranial hypertension (pseudotumor cerebri) 5. Otherwise unremarkable MRI appearance of the brain. 6. Mild paranasal sinus disease, as described. 7. Incompletely assessed cervical spondylosis and postsurgical changes.  PAIN:  Are you having pain? Yes: NPRS scale: 3/10 Pain location: back Relieving factors: pain medication   FALLS: Has patient fallen in last 6 months?  Yes, Number of falls: 10  Had PT referral but decided not to pursue. Pt reports she feels it is medication-related  LIVING ENVIRONMENT: Lives with: lives with their daughter and fiance Lives in: House/apartment  PLOF:  Level of assistance: Independent with IADLs Employment: Full-time employment   PATIENT GOALS wordfinding and stuttering  OBJECTIVE:   COGNITION: Overall cognitive status:  not formally assessed; MD has recommended neuropsychological evaluation Areas of impairment:  Pt reports short term memory difficulties Functional deficits: misplacing items, forgetful: needs to write things down  COGNITIVE COMMUNICATION Following directions: Follows multi-step commands consistently  Auditory comprehension: WFL Verbal expression: WFL Functional communication: WFL  ORAL MOTOR EXAMINATION Facial : WFL Lingual: WFL Velum: WFL Mandible: WFL Cough: WFL Voice: WFL   STANDARDIZED ASSESSMENTS: [place] Naming Test  The [employer] was administered. Pt scored 57/60. This is WNL  of 56.8 +/- 3  for age range of 36-49.   Number of spontaneously given correct responses: 56 Number of stimulus cues given: 4 Number of correct responses following a stimulus cue: 1 Number of phonemic  cues: 3 Number of correct responses following a phonemic cue: 1 Number of multiple choices given: 0 Number of correct choices: n/a  [place] Phonological: 0 Verbal: 0 Neologistic: 0 Multi-word: 0 Perceptual:0   MOTOR SPEECH EVALUATION   RESPIRATION: WNL  PHONATION:  Voice quality: normal, hoarse, low vocal intensity, and variable  Maximum phonation time for sustained "ah": 7.7 seconds  Pitch range in conversation: 112-178 Hz  S/Z ratio: 1.46    RESONANCE: WNL  ARTICULATION: Alternating Motion Rate: 16 in 5 seconds (30-35 repetitions/ 5 seconds average for /p/) Sequential Motion Rate: Slow, able to increase slightly with cues for faster rate (from 5 reps of buttercup to 7 reps) Connected Speech characteristics: Irregular articulatory breakdowns, Dysfluency, Excess and equal stress, hoarse vocal quality (all intermittent/irregular)  Intelligibility: For trained listener with context in a quiet environment, intelligibility rated as approximately 100 % Non-verbal oral apraxia: Not present        PATIENT REPORTED OUTCOME MEASURES (PROM):  The Communication Effectiveness Survey is a patient-reported outcome measure in which the patient rates their own effectiveness in different communication situations. A higher score indicates greater effectiveness.   Pt's self-rating was 20/32. Patient reported less effectiveness when upset or in a noisy environment.  The Neuro-QOLT Item Bank v2.0-Cognition Function-Short Form is an eight-item test designed to measure difficulties with cognitive  functioning (e.g., memory, attention and decision making or in the application of such abilities to everyday tasks (e.g., planning, organizing, calculating, remembering and learning).  T-SCORE: 35; 1.5 SD below mean of 50   PATIENT EDUCATION: Education details: agree with neuro recommendations for neuropsychology evaluation, brief course of ST focused on compensation training Person  educated: Patient Education method: Explanation Education comprehension: verbalized understanding     GOALS: Goals reviewed with patient? Yes    LONG TERM GOALS: Target date: 03/07/2022 Patient will use anomia and fluency compensations in mod complex conversation of 15-20 minutes with modified independence. Baseline:  Goal status: INITIAL  2.  Patient will demonstrate awareness of strategies (self-advocacy, relaxation techniques, reducing time pressure) to increase comfort in a variety of speaking environments outside of ST.   Baseline:  Goal status: INITIAL   ASSESSMENT:  CLINICAL IMPRESSION: Patient presents with inconsistent dysfluencies and speech changes characterized by irregular consonant prolongations (particularly /s/), typically word-initial but at times in medial or word-final position. Hesitations vs blocks occur intermittently during pauses, sentence breaks. Noted fluctuations in pitch and vocal quality which appear to be transient in nature and worsen when pt is discussing stressful events. Patient reports frustration with wordfinding difficulties. She scored 57/60 on the [place], which is within normal limits for age. She also reports short-term memory impairments. No focal cranial nerve impairments are noted on examination. Patient endorsed being extremely anxious at the beginning of the evaluation because she got lost on the way to her appointment; her speech became more fluent as the session progressed, particularly in spontaneous vs structured evaluation tasks. Suspect anxiety is a signficant contributing factor and encouraged pt to follow up on her neuropsychology referral per neuro. Recommend brief course of skilled ST with focus on compensatory strategies vs cognitive training to improve pt's ability to manage her speech and language impairments during communication breakdowns.   OBJECTIVE IMPAIRMENTS include memory, motor speech and anomia. These  impairments are limiting patient from effectively communicating at home and in community. Factors affecting potential to achieve goals and functional outcome are co-morbidities. Patient will benefit from skilled SLP services to address above impairments and improve overall function.  REHAB POTENTIAL: Fair -good    PLAN: SLP FREQUENCY: 1x/week  SLP DURATION: 4 weeks  PLANNED INTERVENTIONS: Environmental controls, Functional tasks, Multimodal communication approach, SLP instruction and feedback, Compensatory strategies, and Patient/family education  Deneise Lever, MS, Actor [phone]

## 2022-01-20 ENCOUNTER — Encounter: Payer: BLUE CROSS/BLUE SHIELD | Admitting: Speech Pathology

## 2022-02-03 ENCOUNTER — Ambulatory Visit: Payer: BC Managed Care – PPO | Attending: Neurology | Admitting: Speech Pathology

## 2022-02-03 DIAGNOSIS — F8081 Childhood onset fluency disorder: Secondary | ICD-10-CM | POA: Insufficient documentation

## 2022-02-03 DIAGNOSIS — R41841 Cognitive communication deficit: Secondary | ICD-10-CM | POA: Insufficient documentation

## 2022-02-03 DIAGNOSIS — R4789 Other speech disturbances: Secondary | ICD-10-CM | POA: Diagnosis present

## 2022-02-03 NOTE — Therapy (Signed)
OUTPATIENT SPEECH LANGUAGE PATHOLOGY TREATMENT AND DISCHARGE SUMMARY   Patient Name: Breanna Ramos MRN: [id number] DOB:24-Jun-1970, 51 y.o., female Today's Date: 02/03/2022  PCP: Gladstone Lighter, MD  REFERRING PROVIDER: Gurney Maxin, MD/ Luella Cook, PA  SPEECH THERAPY DISCHARGE SUMMARY  Visits from Start of Care: 2  Current functional level related to goals / functional outcomes: Patient reports she is no longer stuttering; goal discontinued. Pt met remaining LTG for strategies to improve comfort in speaking scenarios.   Remaining deficits: Anomia occurs when anxious.   Education / Equipment: Suspect anxiety, attentional deficits are primary cause of wordfinding deficits. These are more appropriately managed by psychologist/MD.   Patient agrees to discharge. Patient goals were partially met. Patient is being discharged due to maximized rehab potential. .      End of Session - 02/03/22 1017     Visit Number 2    Number of Visits 5    Date for SLP Re-Evaluation 03/07/22   60 days to allow for scheduling   SLP Start Time 1006    SLP Stop Time  1100    SLP Time Calculation (min) 54 min    Activity Tolerance Patient tolerated treatment well             Past Medical History:  Diagnosis Date   Acute focal neurologic deficit with complete resolution    Arthritis    Carpal tunnel syndrome    Cerebral venous sinus thrombosis    Chronic, continuous use of opioids    Depression    Diabetes mellitus without complication (HCC)    Fibromyalgia    H/O cardiac radiofrequency ablation    History of rectal bleeding 08/2020   Hypertension    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   Nicotine dependence    Normocytic anemia    OSA on CPAP    Past Surgical History:  Procedure Laterality Date   BACK SURGERY     x 2   CARDIAC ELECTROPHYSIOLOGY STUDY AND ABLATION     CHOLECYSTECTOMY     COLONOSCOPY WITH PROPOFOL N/A 03/07/2020   Procedure: COLONOSCOPY WITH PROPOFOL;  Surgeon:  Lin Landsman, MD;  Location: [facility] ENDOSCOPY;  Service: Gastroenterology;  Laterality: N/A;   FRACTURE SURGERY     Left Ankle   Patient Active Problem List   Diagnosis Date Noted   History of rectal bleeding, 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 08/2020 07/03/2021   Cerebral venous sinus thrombosis 07/02/2021   History of PSVT (paroxysmal supraventricular tachycardia)    OSA on CPAP    Nicotine dependence, cigarettes, uncomplicated    Acute focal neurologic deficit with complete resolution    Chronic pain    Chronic, continuous use of opioids    Rectal bleeding    On long term drug therapy 02/17/2020   Anxiety 02/17/2020   Depression 02/17/2020   Inflammation of sacroiliac joint ([place]) 02/17/2020   Low back pain 02/17/2020   Muscle atrophy 02/17/2020   Myofascial pain 02/17/2020   Neck pain 02/17/2020   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   SBO (small bowel obstruction) ([place]) 02/14/2020   Partial small bowel obstruction ([place]) 02/13/2020   Diabetes mellitus without complication (HCC)    Essential hypertension    Paroxysmal SVT (supraventricular tachycardia) ([place])     ONSET DATE: 12/23/2021 (referral date) pt reports rare intermittent difficulties prior to TIA in Feb 2023, with return to normal post TIA, and worsening in late May/June with stuttering  REFERRING DIAG: wordfinding difficulty  THERAPY DIAG:  Stuttering  Other speech disturbance  Cognitive  communication deficit  Rationale for Evaluation and Treatment Rehabilitation  SUBJECTIVE:   SUBJECTIVE STATEMENT: "They took me off one of my meds and now I'm not stuttering." Pt accompanied by: self  PERTINENT HISTORY: Patient is a 51 y.o. female presenting for evaluation of wordfinding difficulties. Past medical history is noted for TIA, pituitary adenoma, central venous sinus thrombosis, anxiety, depression, sleeping problem, tremors, and falls. Patient presented [facility] ED 07/04/21 due to "transient spell of feeling foggy and  dizzy along with lightheadedness and shaky hands. This was followed by trouble getting words out," and underwent w/u for TIA. Pt reports her speech returned to baseline after this event. Reports onset of stuttering and difficulty with wordfinding, inconsistent but worsening since late May or early June 2023. Currently seeing psychiatry, several recent stressors including loss of her mother and job loss in that past 6 months.  DIAGNOSTIC FINDINGS: MRI 07/02/21: 1. No evidence of acute infarct.2. No focal pituitary lesion is appreciated. 3. Curvilinear focus of hypoenhancement within the right transverse and sigmoid dural venous sinuses, which may reflect flow artifact or a small non-occlusive thrombus. CT venography of the head may be helpful for further characterization. 4. Partially empty sella turcica. While this finding often reflect incidental anatomic variation, it can also be associated with idiopathic intracranial hypertension (pseudotumor cerebri) 5. Otherwise unremarkable MRI appearance of the brain. 6. Mild paranasal sinus disease, as described. 7. Incompletely assessed cervical spondylosis and postsurgical changes.  PAIN:  Are you having pain? No   PATIENT GOALS wordfinding and stuttering  OBJECTIVE:   TODAY'S TREATMENT: Patient appeared physically anxious in waiting room and throughout session today. She reports with medication adjustment she is no longer stuttering. Had neuropscyhology evaluation last week; these results are not available in [facility]. Pt stated that since she is no longer stuttering, she was not sure she still needed speech therapy. Pt exhibited decreased sustained attention, topic maintenance and impulsivity. Educated pt on strategies for attention, as well as deep breathing to relax and refocus. Patient in agreement with SLP that managing anxiety and pursuing talk therapy is likely to be more productive/helpful for pt than speech therapy. Pt in agreement with d/c  PATIENT  EDUCATION: Education details: slow down, pause, take a breath before responding. Provided list of mental health providers in [place] accepting new patients and pt's insurance, as well as how to do a provider search for mental health professional on [employer]. Person educated: Patient Education method: Explanation Education comprehension: verbalized understanding     GOALS: Goals reviewed with patient? Yes    LONG TERM GOALS: Target date: 03/07/2022 Patient will use anomia and fluency compensations in mod complex conversation of 15-20 minutes with modified independence. Baseline:  Goal status: DISCONTINUED  2.  Patient will demonstrate awareness of strategies (self-advocacy, relaxation techniques, reducing time pressure) to increase comfort in a variety of speaking environments outside of ST.   Baseline:  Goal status: MET   ASSESSMENT:  CLINICAL IMPRESSION: Inconsistent dysfluencies and speech changes noted at evaluation have resolved.  Patient appeared highly anxious throughout session today; suspect this is primary contributing factor to her reports of wordfinding difficulties (none noted in 1 hour session today). Patient demonstrated awareness of strategies to reduce impulsivity, increase attention and improve comfort in speaking scenarios today; she reported using self-advocacy (as instructed last session) to help her through a communication breakdown last week. Feel that pt has gained max benefit from skilled ST and would likely benefit from managing anxiety/mental health via a professional counselor,  psychologist, or LCSW. Patient stated she has been encouraged to do this but has not followed through. She has tried to find a provider through employee assistance at work. Education provided on how to locate a provider through her insurance. Pt is in agreement with d/c from ST at this time.   OBJECTIVE IMPAIRMENTS include memory, motor speech and anomia. These impairments are  limiting patient from effectively communicating at home and in community. Factors affecting potential to achieve goals and functional outcome are co-morbidities. Patient will benefit from skilled SLP services to address above impairments and improve overall function.  REHAB POTENTIAL: Fair -good    PLAN: SLP FREQUENCY:  dc  SLP DURATION: other: dc  PLANNED INTERVENTIONS: Environmental controls, Functional tasks, Multimodal communication approach, SLP instruction and feedback, Compensatory strategies, and Patient/family education  Deneise Lever, MS, Actor [phone]

## 2022-02-03 NOTE — Patient Instructions (Signed)
To find a counselor that is covered by your insurance  Go to the [employer] your plan information Click on "find a provider" Click on "Doctors by specialty" Type "mental health" into the search Click on filters. I recommend changing the distance to 5-10 miles. Select the checkbox for accepting new patients. You can look at the other filters to narrow your results as you please (by gender, etc).  The providers you find should be able to help with counseling. Different types include: psychologists, Licensed professional counselors ([place]), Licensed clinical social workers (LCSW), among others

## 2022-02-10 ENCOUNTER — Ambulatory Visit: Payer: BC Managed Care – PPO | Admitting: Speech Pathology

## 2022-02-17 ENCOUNTER — Encounter: Payer: BLUE CROSS/BLUE SHIELD | Admitting: Speech Pathology

## 2022-02-24 ENCOUNTER — Encounter: Payer: BLUE CROSS/BLUE SHIELD | Admitting: Speech Pathology

## 2022-03-03 ENCOUNTER — Encounter: Payer: BLUE CROSS/BLUE SHIELD | Admitting: Speech Pathology

## 2022-03-07 IMAGING — MR MR HEAD WO/W CM
12 of 21 series · 23 of 48 positions shown · IV contrast (9ml Gadavist)
Comparison: Head CT 07/02/2021.

CLINICAL DATA: Provided history: CVA/TIA symptoms. History of
microprolactinoma. Additional history provided: Patient reports
dizziness and bilateral leg weakness since 9 a.m. History of
microprolactinoma with surgery in 1131.

EXAM:
MRI HEAD WITHOUT AND WITH CONTRAST
TECHNIQUE: Multiplanar, multiecho pulse sequences of the brain and surrounding
structures were obtained without and with intravenous contrast.
CONTRAST:  9mL GADAVIST GADOBUTROL 1 MMOL/ML IV SOLN

[Series 10: T2 · axial · 5.0mm · 0.53mm/px · z∈[-85,+68]mm · 2 of 27 slices shown (1 of 2)]
[im 1/27]
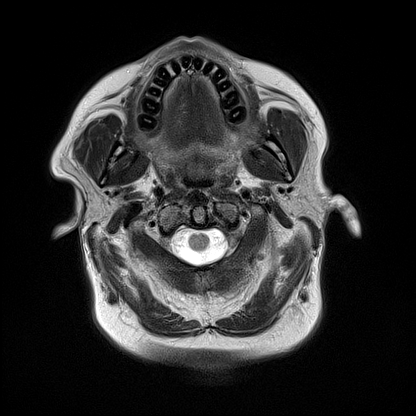
[im 27/27]
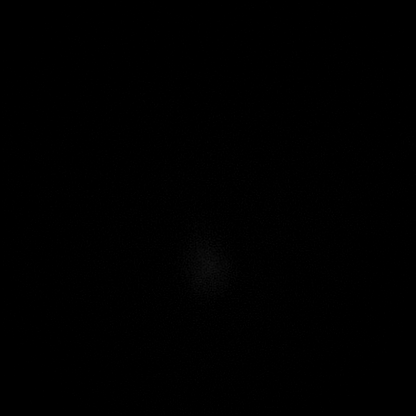

[Series 12: FLAIR · axial · 3.0mm · 0.69mm/px · z∈[-88,+71]mm · 4 of 55 slices shown]
[im 1/55]
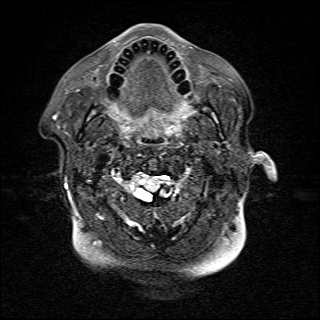
[im 19/55]
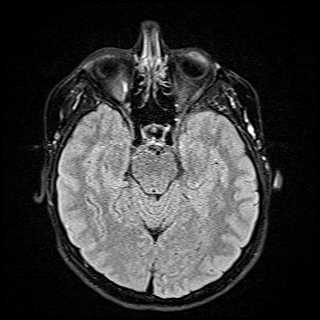
[im 37/55]
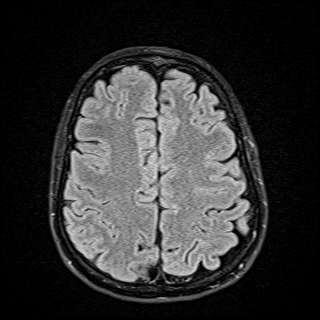
[im 55/55]
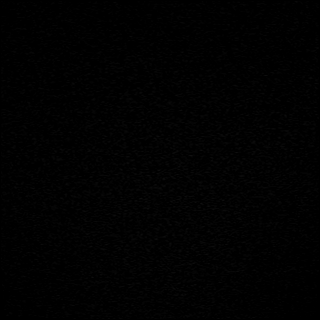

[Series 15: T2 · coronal · 3.0mm · 0.21mm/px · 1 of 13 slices shown (2 of 2)]
[im 1/13]
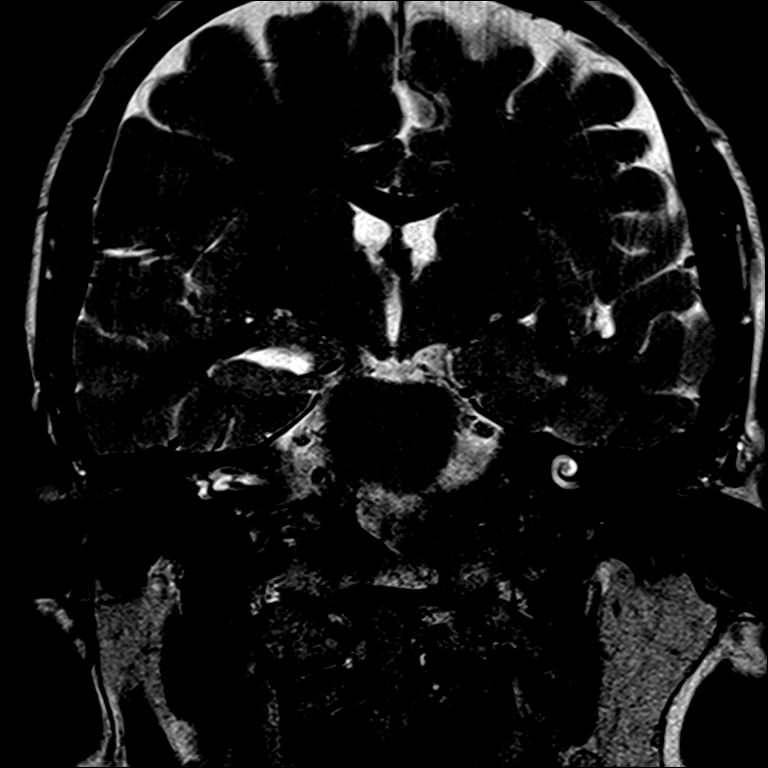

[Series 18: T1 post-contrast · coronal · 3.0mm · 0.28mm/px · 1 of 11 slices shown (1 of 9)]
[im 1/11]
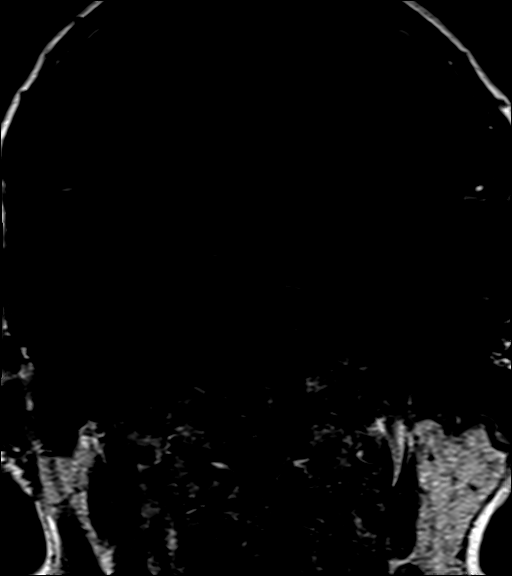

[Series 19: T1 post-contrast · coronal · 3.0mm · 0.28mm/px · 1 of 11 slices shown (2 of 9)]
[im 1/11]
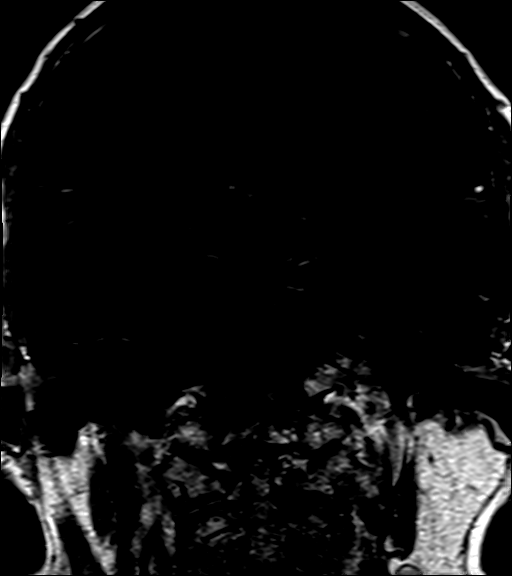

[Series 20: T1 post-contrast · coronal · 3.0mm · 0.28mm/px · 1 of 11 slices shown (3 of 9)]
[im 1/11]
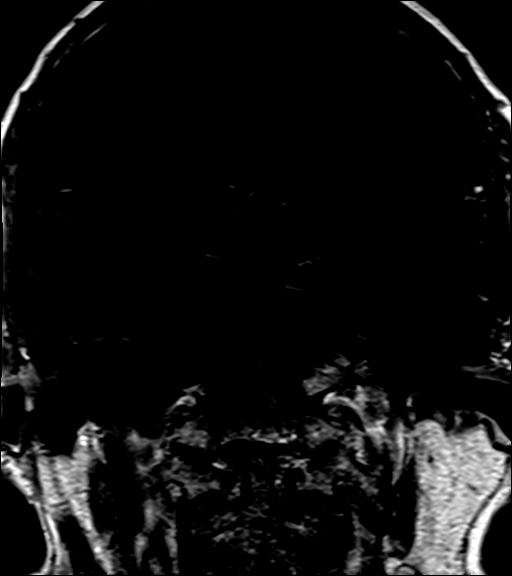

[Series 21: T1 post-contrast · coronal · 3.0mm · 0.28mm/px · 1 of 11 slices shown (4 of 9)]
[im 1/11]
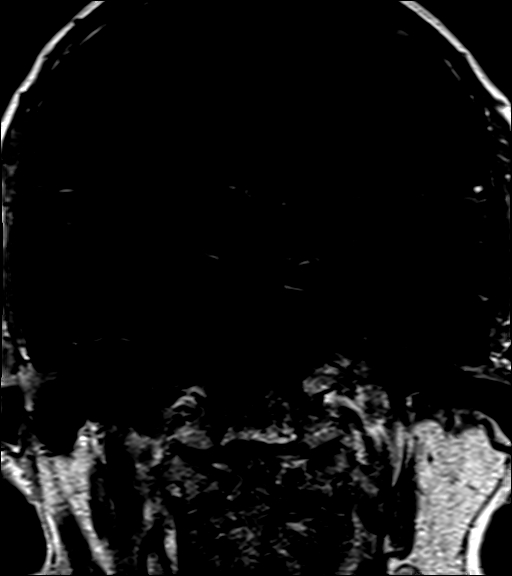

[Series 22: T1 post-contrast · coronal · 3.0mm · 0.28mm/px · 1 of 11 slices shown (5 of 9)]
[im 1/11]
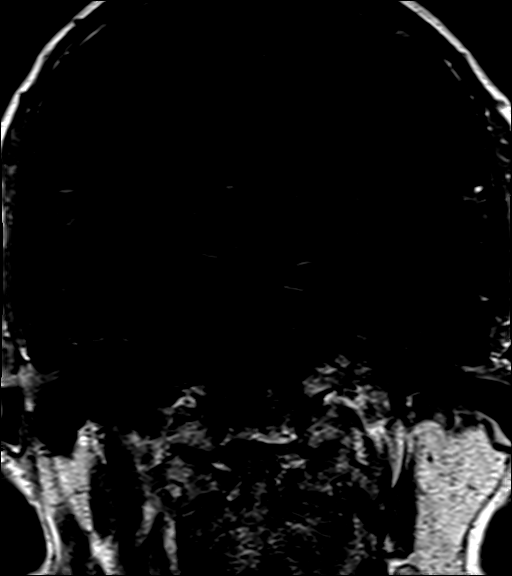

[Series 23: T1 post-contrast · coronal · 3.0mm · 0.28mm/px · 1 of 11 slices shown (6 of 9)]
[im 1/11]
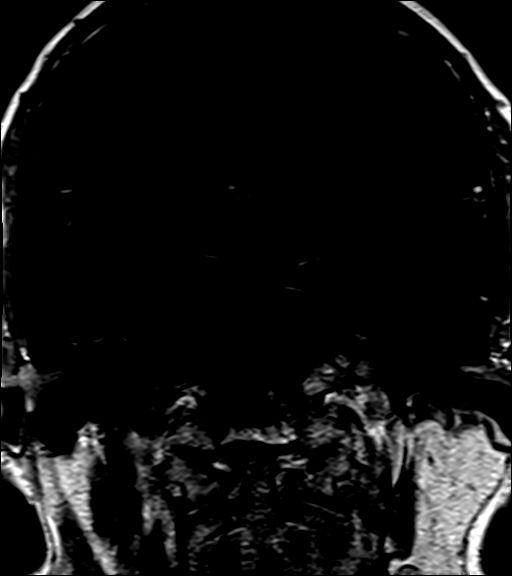

[Series 24: T1 post-contrast · coronal · 3.0mm · 0.21mm/px · 1 of 13 slices shown (7 of 9)]
[im 1/13]
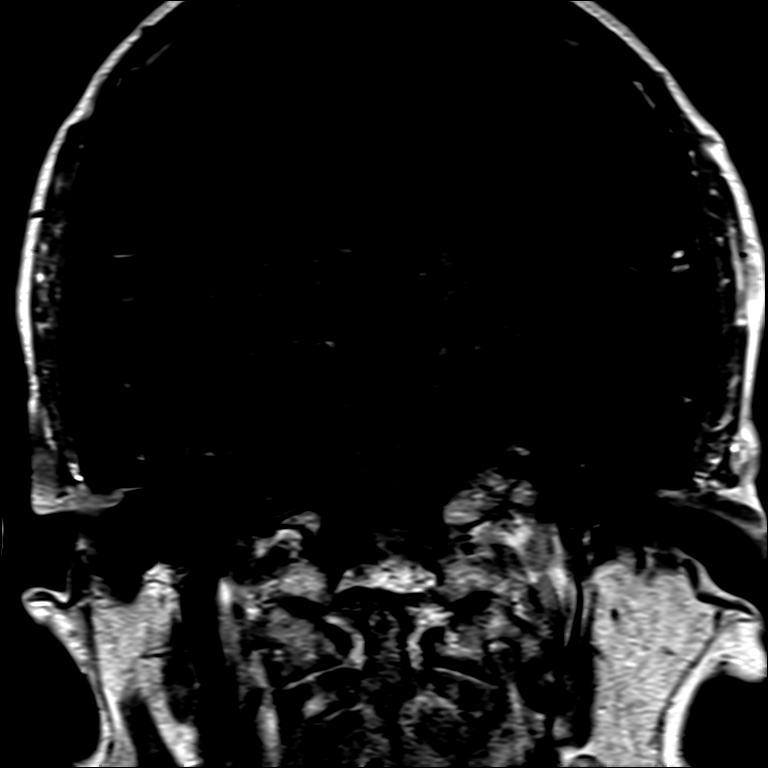

[Series 25: T1 post-contrast · sagittal · 3.0mm · 0.21mm/px · 1 of 13 slices shown (8 of 9)]
[im 1/13]
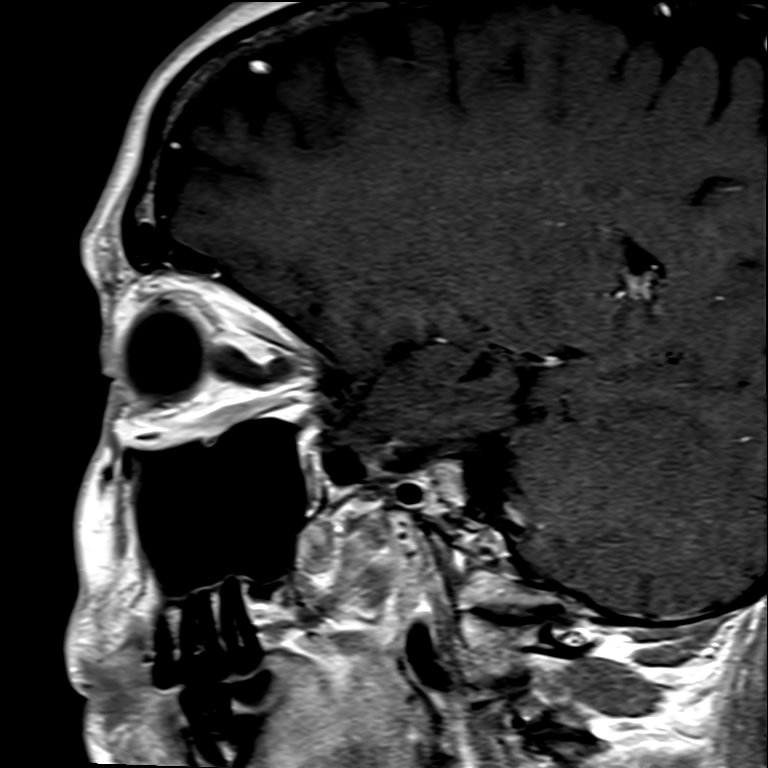

[Series 26: T1 post-contrast · axial · 1.0mm · 0.98mm/px · z∈[-91,+80]mm · 8 of 175 slices shown (9 of 9)]
[im 1/175]
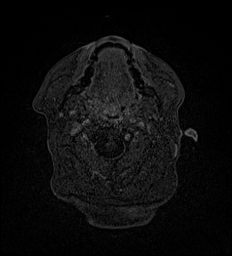
[im 30/175]
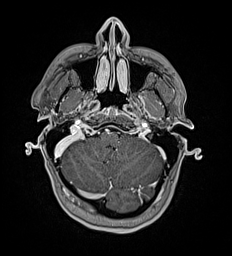
[im 59/175]
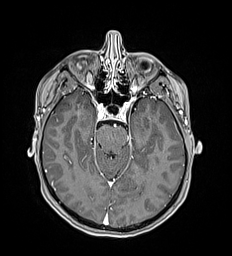
[im 73/175]
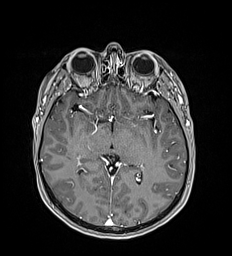
[im 102/175]
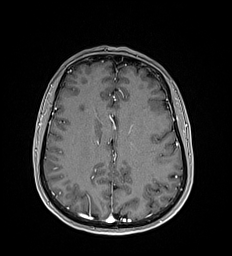
[im 117/175]
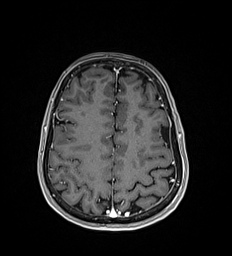
[im 146/175]
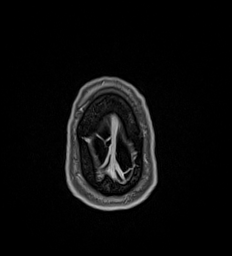
[im 175/175]
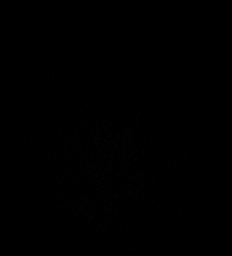

[23 of 48 positions shown; findings below may reference images not displayed]

FINDINGS: Mild intermittent motion degradation.

Brain:

Dedicated pituitary imaging reveals a partially empty sella turcica.
No focal pituitary lesion is identified. The suprasellar cistern is
patent. The pituitary stalk is midline and is not abnormally
thickened.

Cerebral volume is normal. No cortical encephalomalacia is
identified.

There is no acute infarct.

No chronic intracranial blood products.

No extra-axial fluid collection.

No midline shift.

Vascular: Maintained flow voids within the proximal large arterial
vessels. Possible small developmental venous anomaly within the
periventricular right frontal lobe (anatomic variant). Curvilinear
focus of hypoenhancement within the distal transverse and proximal
sigmoid dural venous sinuses on the right (for instance as seen on
series 26, image 37) (series 100, image 169). This may reflect flow
artifact or a small nonocclusive thrombus.

Skull and upper cervical spine: No focal suspicious marrow lesion.
Susceptibility artifact arising from cervical spinal fusion
hardware. Incompletely assessed cervical spondylosis.

Sinuses/Orbits: Visualized orbits show no acute finding. Small
mucous retention cyst within the left maxillary sinus. Trace mucosal
thickening within the bilateral ethmoid sinuses.

Impression #3 was called by telephone at the time of interpretation
on 07/02/2021 at [DATE] to provider Dr. Argaez, who verbally
acknowledged these results.
IMPRESSION: 1. No evidence of acute infarct.
2. No focal pituitary lesion is appreciated.
3. Curvilinear focus of hypoenhancement within the right transverse
and sigmoid dural venous sinuses, which may reflect flow artifact or
a small non-occlusive thrombus. CT venography of the head may be
helpful for further characterization.
4. Partially empty sella turcica. While this finding often reflects
incidental anatomic variation, it can also be associated with
idiopathic intracranial hypertension (pseudotumor cerebri).
5. Otherwise unremarkable MRI appearance of the brain.
6. Mild paranasal sinus disease, as described.
7. Incompletely assessed cervical spondylosis and postsurgical
changes.

## 2022-03-07 IMAGING — CT CT HEAD W/O CM
4 series · 16 of 47 positions shown, 18 images · non-contrast
Comparison: None.

CLINICAL DATA: Dizziness, non-specific



[Series 2: head bone · axial · 0.44mm/px · z∈[+315,+345]mm · 3 of 75 slices shown]
[im 8/75  bone]
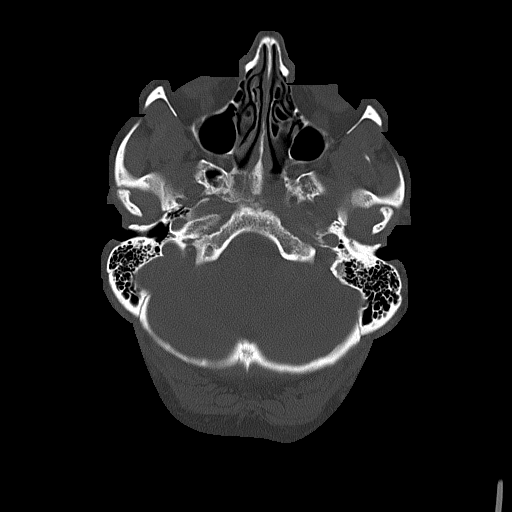
[im 15/75  bone]
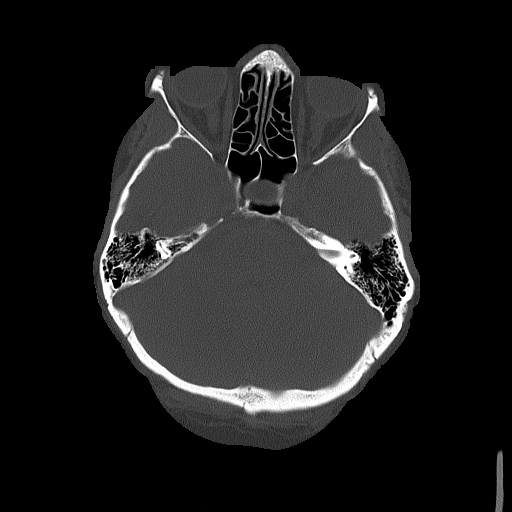
[im 23/75  bone]
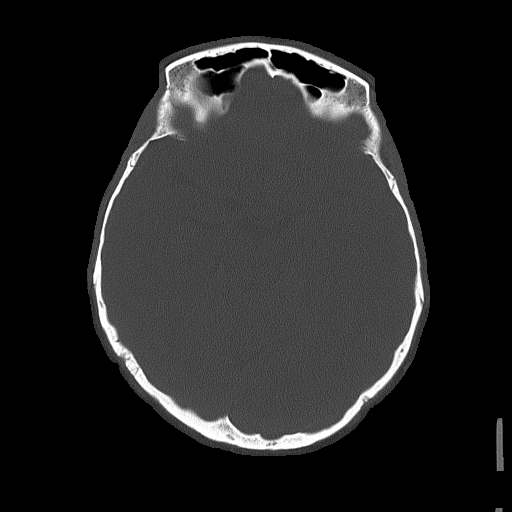

[Series 3: head wo · axial · 0.44mm/px · z∈[+316,+426]mm · 7 of 30 slices shown, 9 images]
[im 4/30  brain]
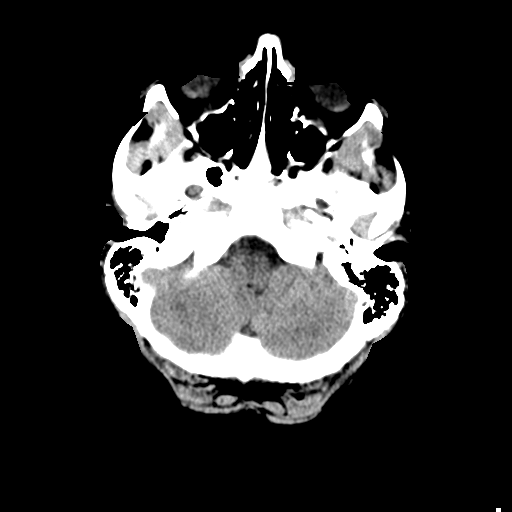
[im 4/30  bone]
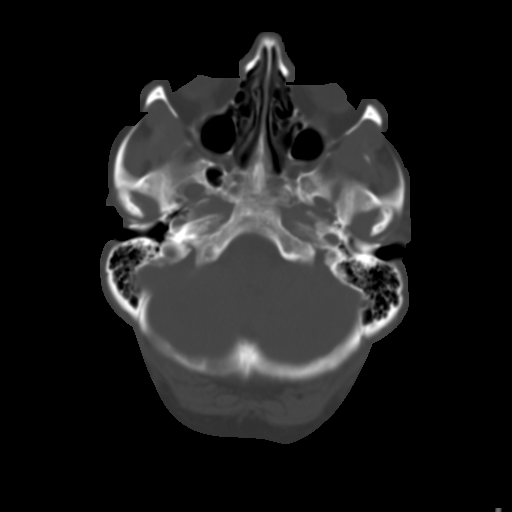
[im 8/30  brain]
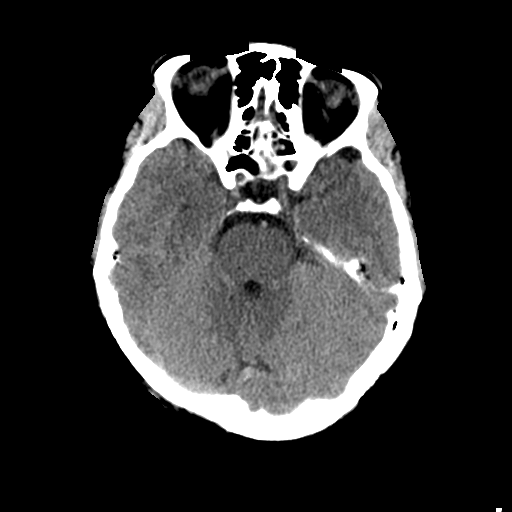
[im 11/30  brain]
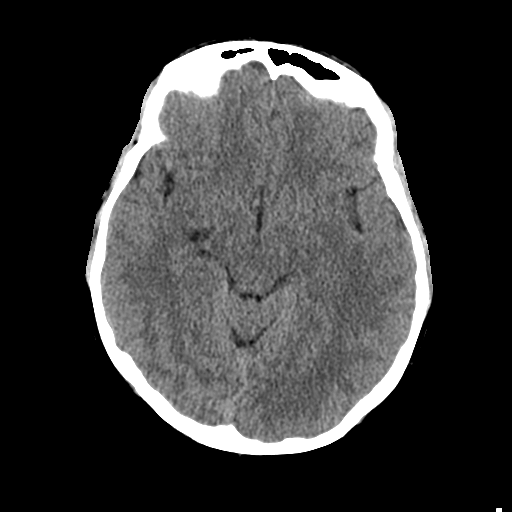
[im 15/30  brain]
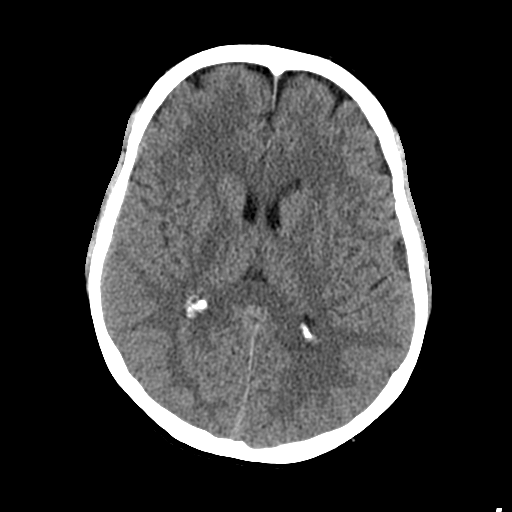
[im 19/30  brain]
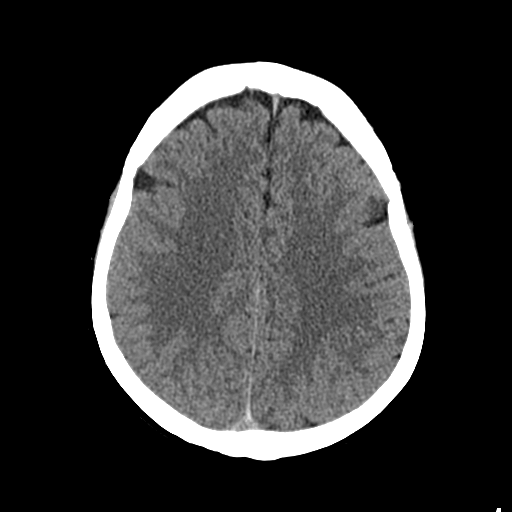
[im 19/30  bone]
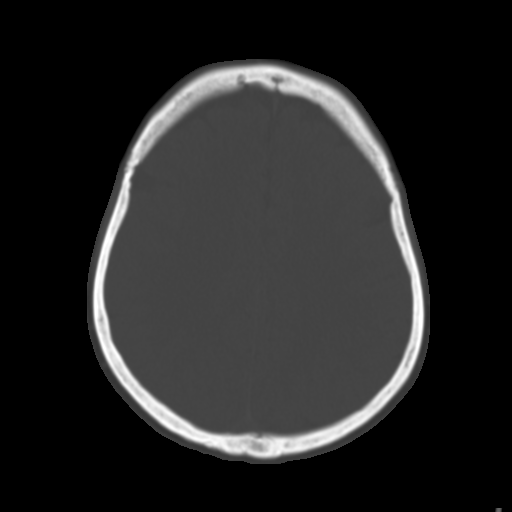
[im 22/30  brain]
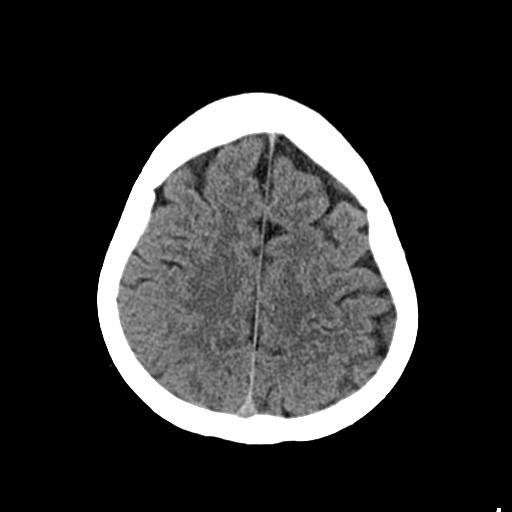
[im 26/30  brain]
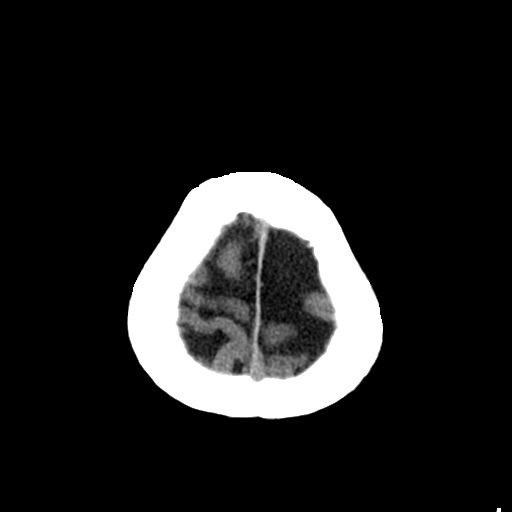

[Series 4: coronal soft tissue · coronal · 0.28mm/px · 3 of 65 slices shown]
[im 22/65  brain]
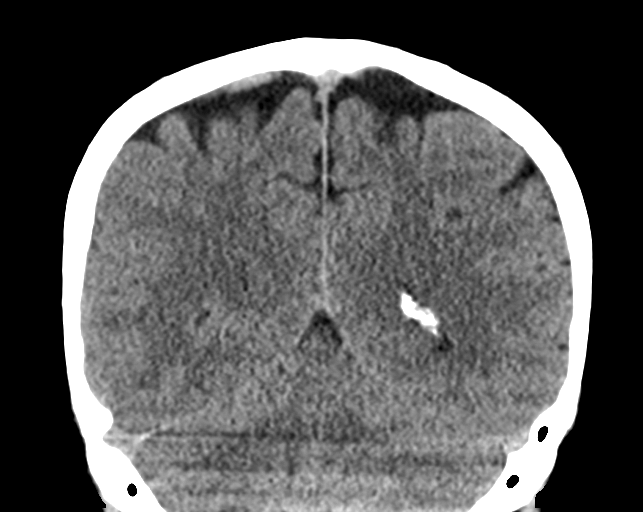
[im 29/65  brain]
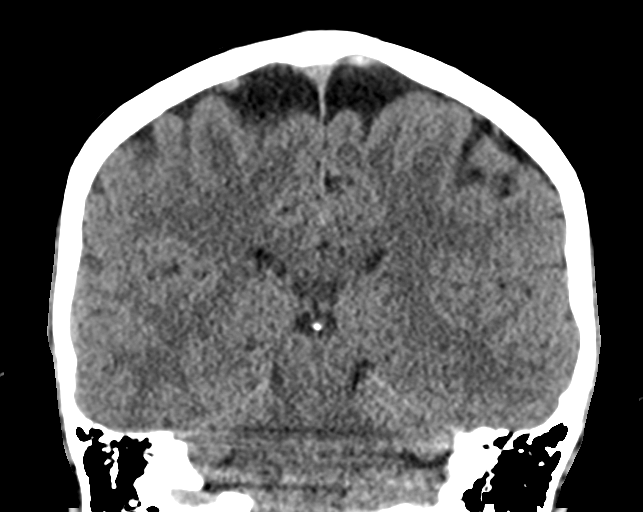
[im 36/65  brain]
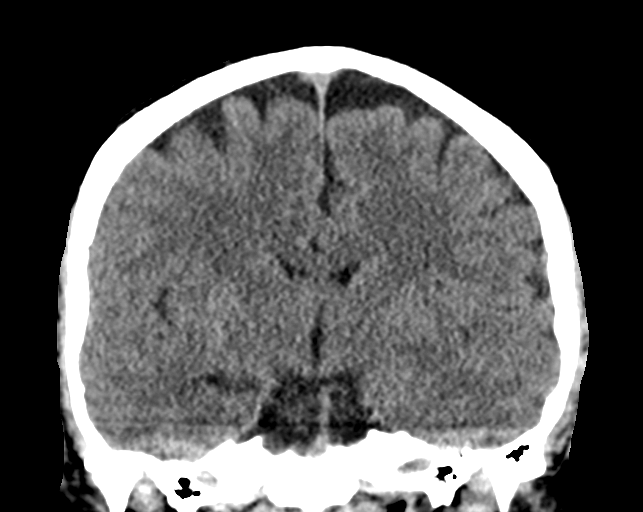

[Series 5: sagittal soft tissue · sagittal · 0.28mm/px · 3 of 60 slices shown]
[im 20/60  brain]
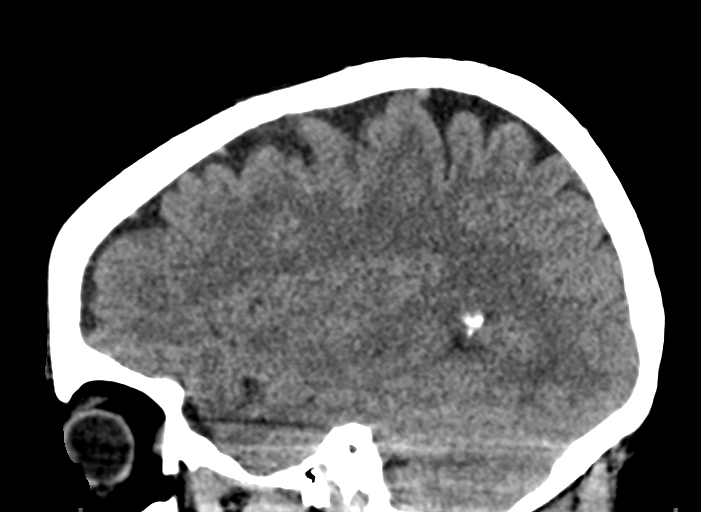
[im 30/60  brain]
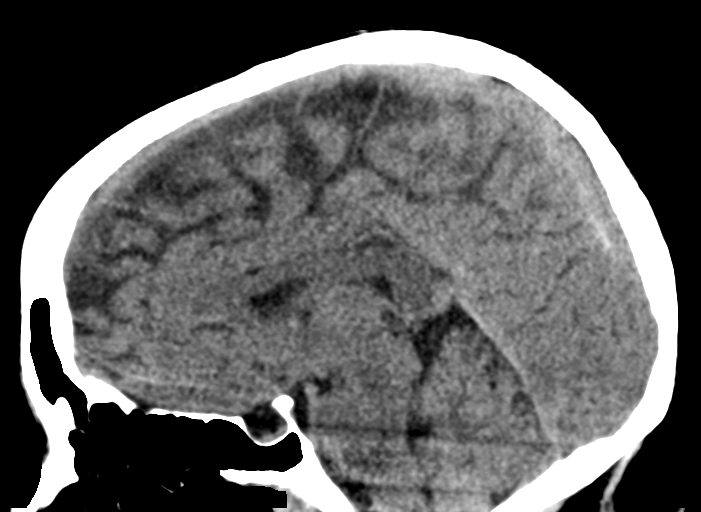
[im 40/60  brain]
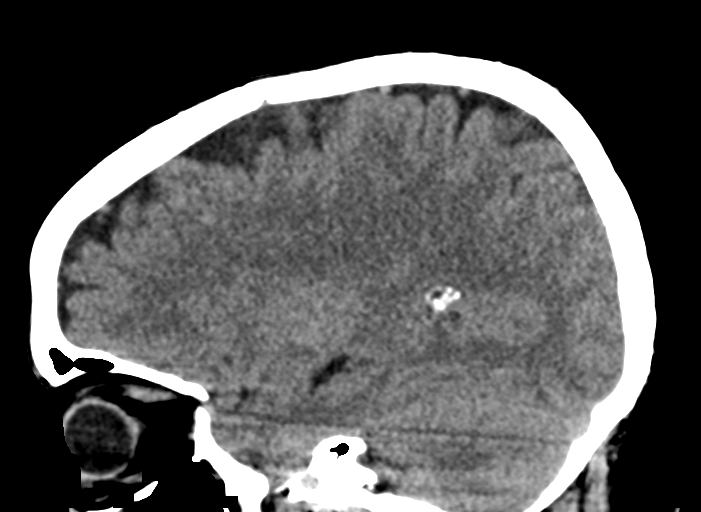

[16 of 47 positions shown; findings below may reference images not displayed]

FINDINGS: Brain: There is no acute intracranial hemorrhage, mass effect, or
edema. Gray-white differentiation is preserved. There is no
extra-axial fluid collection. Ventricles and sulci are within normal
limits in size and configuration.

Vascular: No hyperdense vessel or unexpected calcification.

Skull: Calvarium is unremarkable.

Sinuses/Orbits: No acute finding.

Other: None.
IMPRESSION: No acute intracranial abnormality.

## 2022-03-08 IMAGING — US US CAROTID DUPLEX BILAT
1 series · 14 of 24 positions shown · non-contrast
Comparison: None.

CLINICAL DATA: Transient ischemic attack

EXAM:
BILATERAL CAROTID DUPLEX ULTRASOUND
TECHNIQUE: Gray scale imaging, color Doppler and duplex ultrasound were
performed of bilateral carotid and vertebral arteries in the neck.

[Series 1: us carotid bilateral · 14 of 62 slices shown]
[im 1/62]
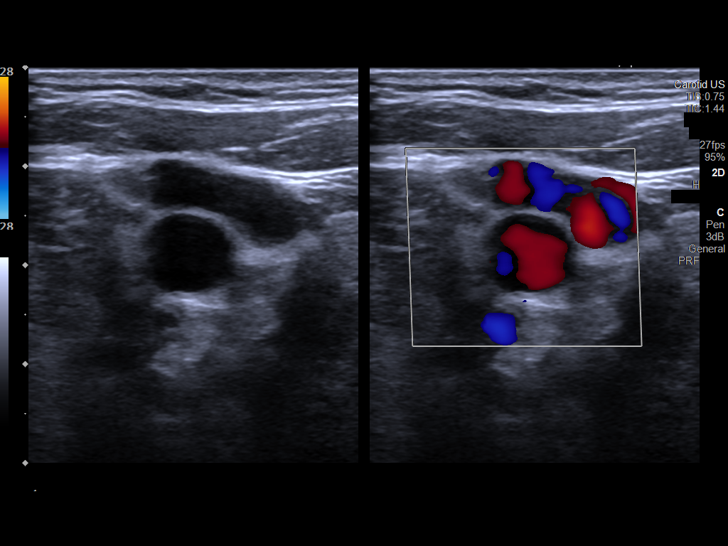
[im 6/62]
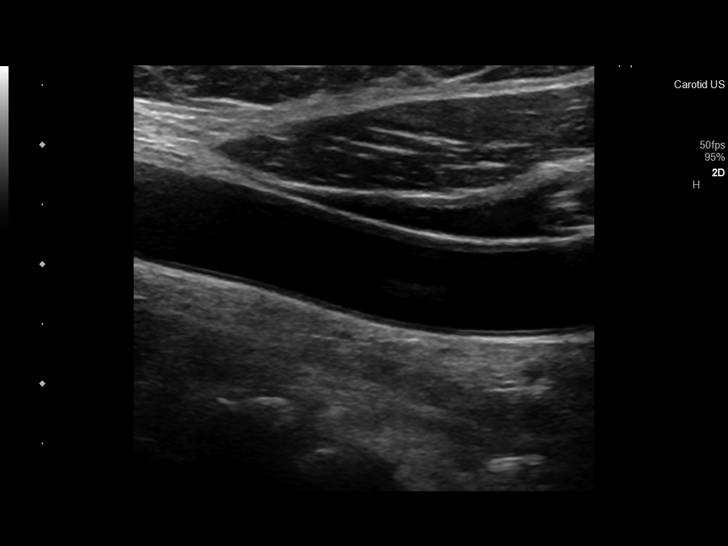
[im 11/62]
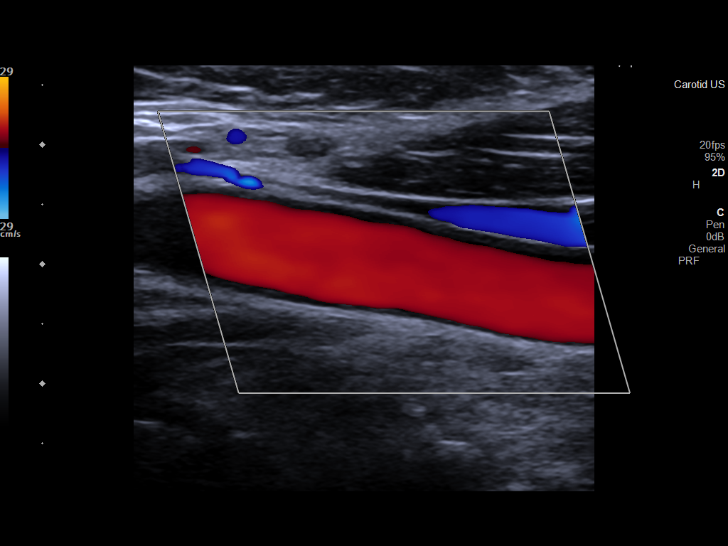
[im 16/62]
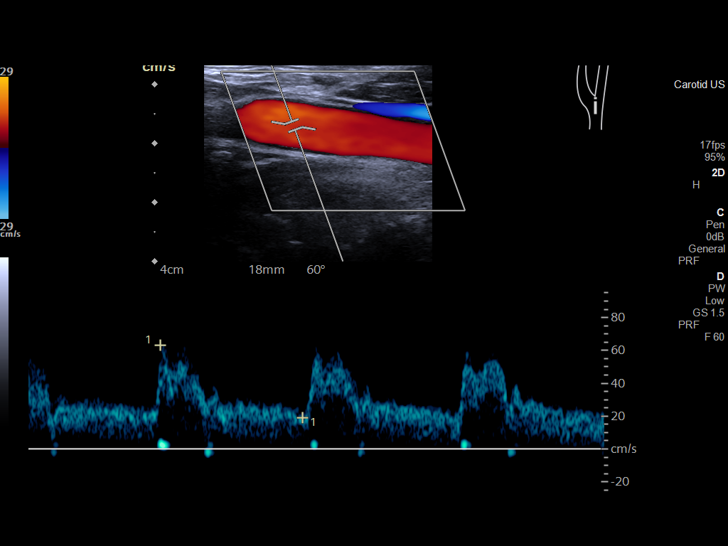
[im 19/62]
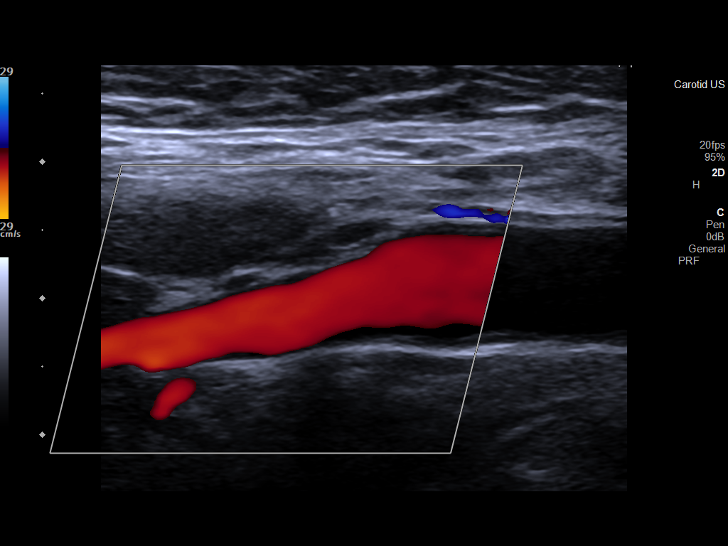
[im 24/62]
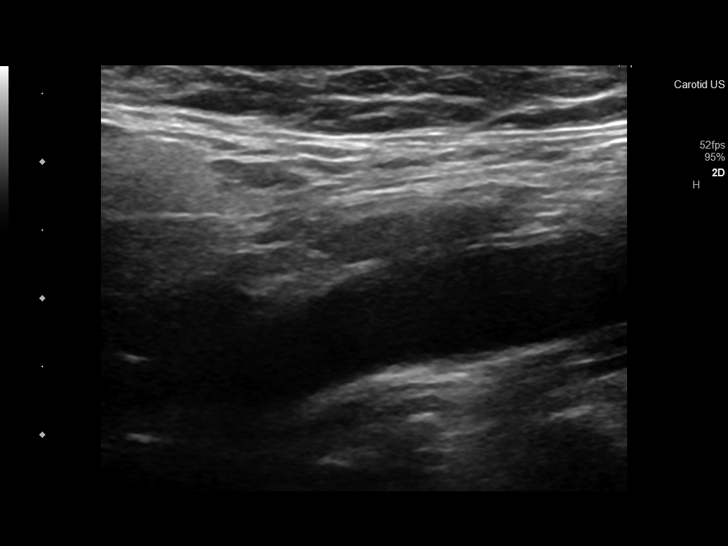
[im 30/62]
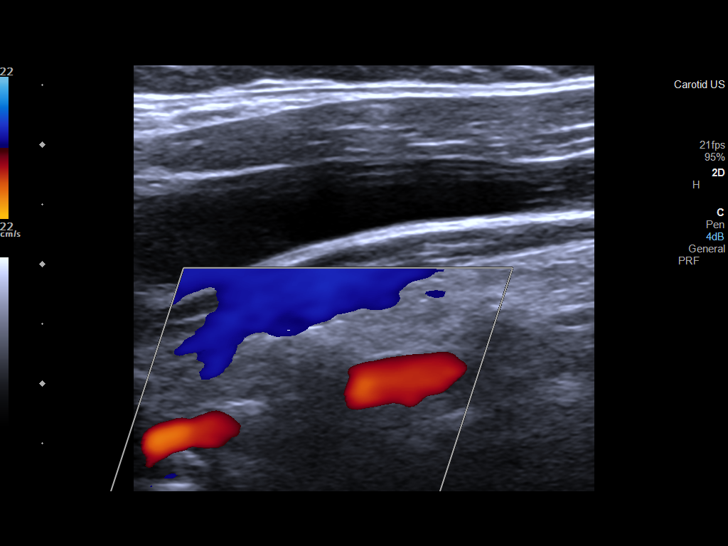
[im 32/62]
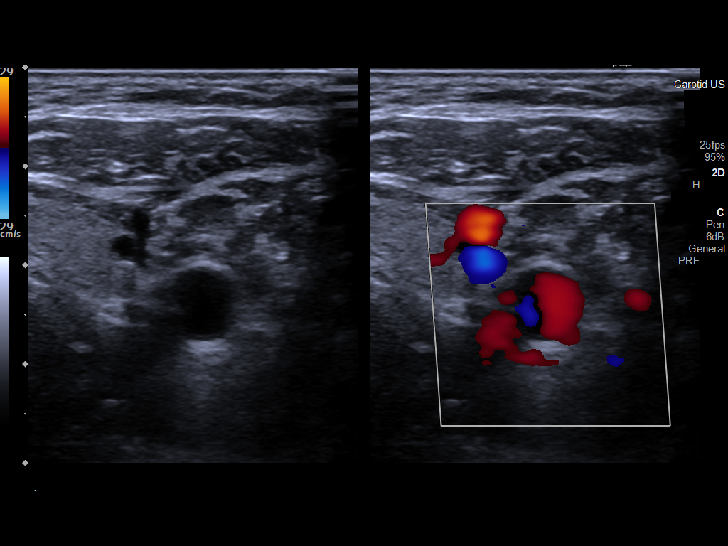
[im 38/62]
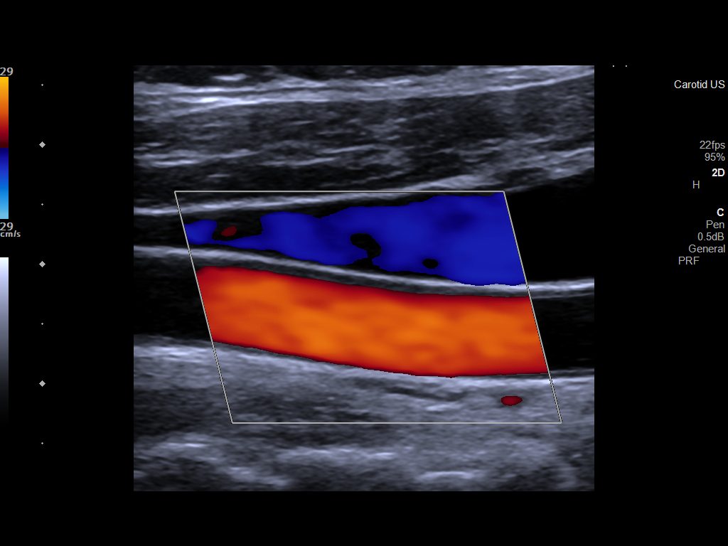
[im 43/62]
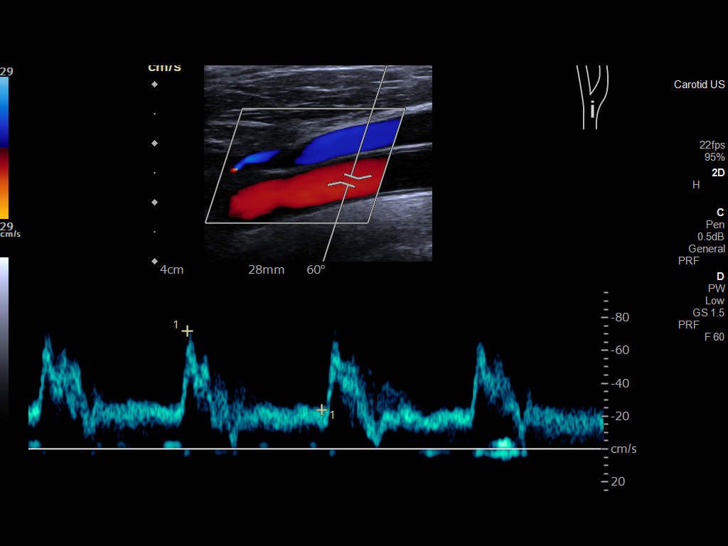
[im 48/62]
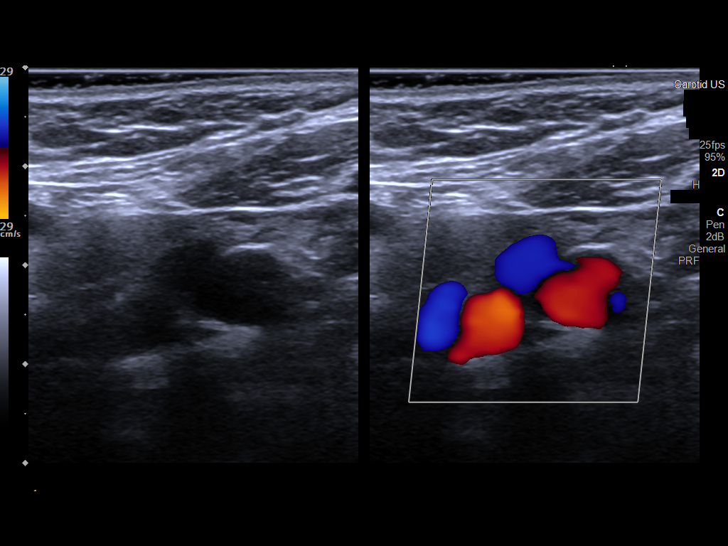
[im 51/62]
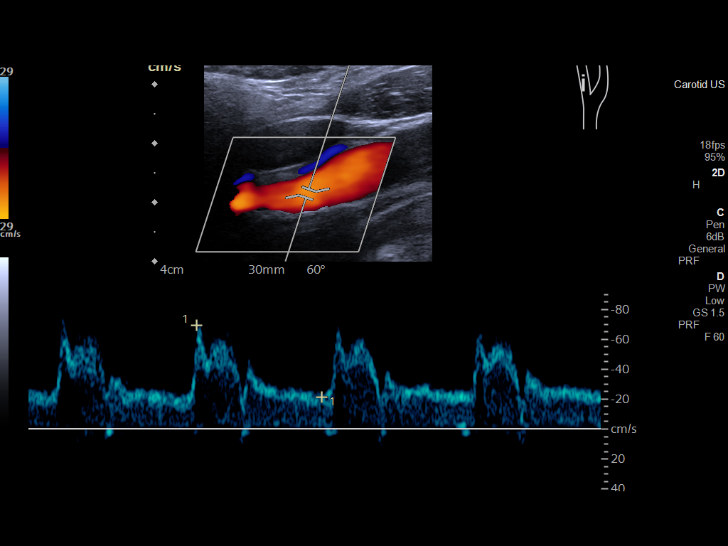
[im 56/62]
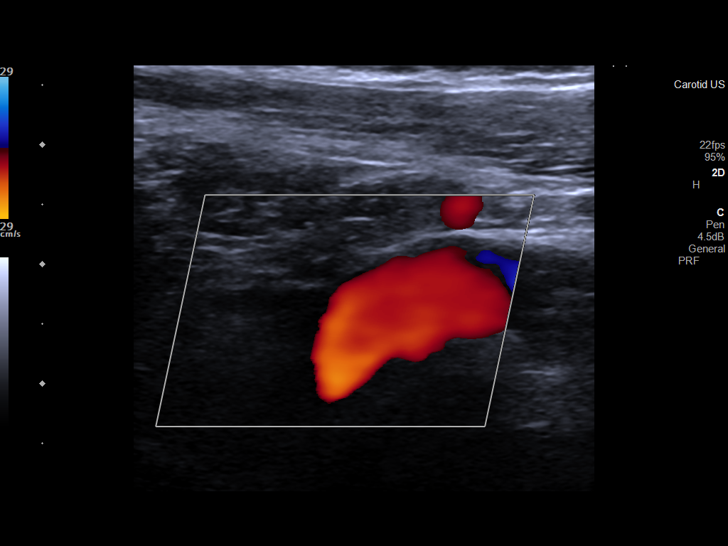
[im 62/62]
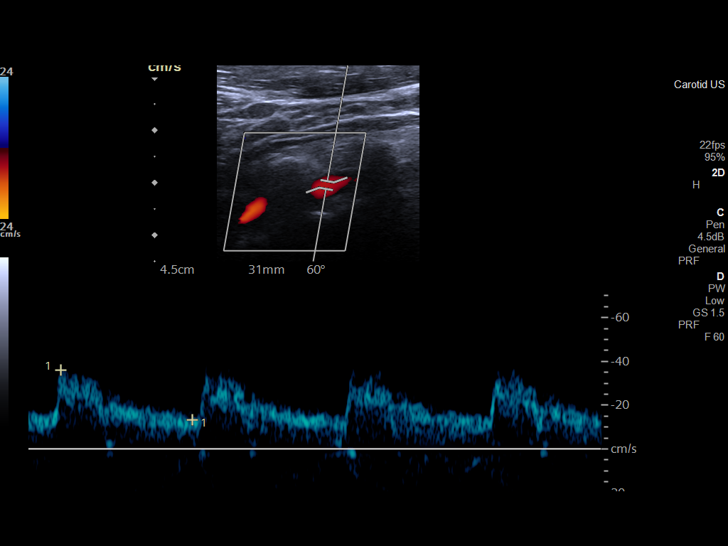

[14 of 24 positions shown; findings below may reference images not displayed]

FINDINGS: Criteria: Quantification of carotid stenosis is based on velocity
parameters that correlate the residual internal carotid diameter
with NASCET-based stenosis levels, using the diameter of the distal
internal carotid lumen as the denominator for stenosis measurement.

The following velocity measurements were obtained:

RIGHT

ICA: 50/23 cm/sec

CCA: 67/18 cm/sec

SYSTOLIC ICA/CCA RATIO:

ECA: 71 cm/sec

LEFT

ICA: 76/34 cm/sec

CCA: 72/24 cm/sec

SYSTOLIC ICA/CCA RATIO:

ECA: 70 cm/sec

RIGHT CAROTID ARTERY: No significant atherosclerotic plaque

RIGHT VERTEBRAL ARTERY:  Antegrade

LEFT CAROTID ARTERY:  No significant atherosclerotic plaque

LEFT VERTEBRAL ARTERY:  Antegrade
IMPRESSION: No evidence of hemodynamically significant stenosis involving the
carotid bifurcations bilaterally.

## 2022-03-10 ENCOUNTER — Encounter: Payer: BLUE CROSS/BLUE SHIELD | Admitting: Speech Pathology

## 2022-03-14 ENCOUNTER — Other Ambulatory Visit: Payer: Self-pay | Admitting: Internal Medicine

## 2022-03-14 ENCOUNTER — Ambulatory Visit
Admission: RE | Admit: 2022-03-14 | Discharge: 2022-03-14 | Disposition: A | Discharge status: Home / Self Care | Payer: No Typology Code available for payment source | Source: Ambulatory Visit | Attending: Internal Medicine | Admitting: Internal Medicine

## 2022-03-14 DIAGNOSIS — M4626 Osteomyelitis of vertebra, lumbar region: Secondary | ICD-10-CM

## 2022-03-14 MED ORDER — GADOBUTROL 1 MMOL/ML IV SOLN
7.5000 mL | Freq: Once | INTRAVENOUS | Status: AC | PRN
  Administered 2022-03-14: 8 mL via INTRAVENOUS

## 2022-03-17 ENCOUNTER — Encounter: Payer: BLUE CROSS/BLUE SHIELD | Admitting: Speech Pathology

## 2022-03-24 ENCOUNTER — Encounter: Payer: BLUE CROSS/BLUE SHIELD | Admitting: Speech Pathology

## 2022-03-31 ENCOUNTER — Encounter: Payer: BLUE CROSS/BLUE SHIELD | Admitting: Speech Pathology

## 2022-04-07 ENCOUNTER — Encounter: Payer: BLUE CROSS/BLUE SHIELD | Admitting: Speech Pathology

## 2022-04-14 ENCOUNTER — Encounter: Payer: BLUE CROSS/BLUE SHIELD | Admitting: Speech Pathology

## 2022-04-14 ENCOUNTER — Emergency Department: Payer: No Typology Code available for payment source

## 2022-04-14 ENCOUNTER — Emergency Department
Admission: EM | Admit: 2022-04-14 | Discharge: 2022-04-14 | Disposition: A | Discharge status: Home / Self Care | Payer: No Typology Code available for payment source | Attending: Emergency Medicine | Admitting: Emergency Medicine

## 2022-04-14 ENCOUNTER — Encounter: Payer: Self-pay | Admitting: Emergency Medicine

## 2022-04-14 ENCOUNTER — Other Ambulatory Visit: Payer: Self-pay

## 2022-04-14 DIAGNOSIS — Z7901 Long term (current) use of anticoagulants: Secondary | ICD-10-CM | POA: Insufficient documentation

## 2022-04-14 DIAGNOSIS — H9203 Otalgia, bilateral: Secondary | ICD-10-CM | POA: Diagnosis present

## 2022-04-14 DIAGNOSIS — Z8673 Personal history of transient ischemic attack (TIA), and cerebral infarction without residual deficits: Secondary | ICD-10-CM | POA: Diagnosis not present

## 2022-04-14 DIAGNOSIS — H93A3 Pulsatile tinnitus, bilateral: Secondary | ICD-10-CM | POA: Diagnosis not present

## 2022-04-14 DIAGNOSIS — H93A9 Pulsatile tinnitus, unspecified ear: Secondary | ICD-10-CM

## 2022-04-14 LAB — CBC WITH DIFFERENTIAL/PLATELET
Abs Immature Granulocytes: 0.02 10*3/uL (ref 0.00–0.07)
Basophils Absolute: 0.1 10*3/uL (ref 0.0–0.1)
Basophils Relative: 1 %
Eosinophils Absolute: 0.1 10*3/uL (ref 0.0–0.5)
Eosinophils Relative: 1 %
HCT: 33 % — ABNORMAL LOW (ref 36.0–46.0)
Hemoglobin: 10.3 g/dL — ABNORMAL LOW (ref 12.0–15.0)
Immature Granulocytes: 0 %
Lymphocytes Relative: 30 %
Lymphs Abs: 2.5 10*3/uL (ref 0.7–4.0)
MCH: 25.1 pg — ABNORMAL LOW (ref 26.0–34.0)
MCHC: 31.2 g/dL (ref 30.0–36.0)
MCV: 80.3 fL (ref 80.0–100.0)
Monocytes Absolute: 0.7 10*3/uL (ref 0.1–1.0)
Monocytes Relative: 8 %
Neutro Abs: 4.9 10*3/uL (ref 1.7–7.7)
Neutrophils Relative %: 60 %
Platelets: 452 10*3/uL — ABNORMAL HIGH (ref 150–400)
RBC: 4.11 MIL/uL (ref 3.87–5.11)
RDW: 14.9 % (ref 11.5–15.5)
WBC: 8.2 10*3/uL (ref 4.0–10.5)
nRBC: 0 % (ref 0.0–0.2)

## 2022-04-14 LAB — COMPREHENSIVE METABOLIC PANEL
ALT: 18 U/L (ref 0–44)
AST: 21 U/L (ref 15–41)
Albumin: 4.2 g/dL (ref 3.5–5.0)
Alkaline Phosphatase: 68 U/L (ref 38–126)
Anion gap: 9 (ref 5–15)
BUN: 15 mg/dL (ref 6–20)
CO2: 28 mmol/L (ref 22–32)
Calcium: 9.4 mg/dL (ref 8.9–10.3)
Chloride: 103 mmol/L (ref 98–111)
Creatinine, Ser: 0.61 mg/dL (ref 0.44–1.00)
GFR, Estimated: >60 mL/min (ref >60)
Glucose, Bld: 93 mg/dL (ref 70–99)
Potassium: 3.8 mmol/L (ref 3.5–5.1)
Sodium: 140 mmol/L (ref 135–145)
Total Bilirubin: 0.6 mg/dL (ref 0.3–1.2)
Total Protein: 7.2 g/dL (ref 6.5–8.1)

## 2022-04-14 LAB — PROTIME-INR
INR: 1.1 (ref 0.8–1.2)
Prothrombin Time: 14.3 seconds (ref 11.4–15.2)

## 2022-04-14 MED ORDER — IOHEXOL 350 MG/ML SOLN
75.0000 mL | Freq: Once | INTRAVENOUS | Status: AC | PRN
  Administered 2022-04-14: 75 mL via INTRAVENOUS

## 2022-04-14 NOTE — ED Provider Triage Note (Signed)
Emergency Medicine Provider Triage Evaluation Note  Breanna Ramos , a 51 y.o. female  was evaluated in triage.  Pt complains of swishing sound in the right side of her head, patient called her doctor and they sent her here to get an MR because of her past medical history.  They are concerned she may have had a TIA previous stroke.  Review of Systems  Positive:  Negative:   Physical Exam  BP (!) 139/98 (BP Location: Left Arm)   Pulse 91   Temp 98.5 F (36.9 C) (Oral)   Resp 17   Wt 85.7 kg   SpO2 95%   BMI 30.51 kg/m  Gen:   Awake, no distress   Resp:  Normal effort  MSK:   Moves extremities without difficulty  Other:    Medical Decision Making  Medically screening exam initiated at 12:26 PM.  Appropriate orders placed.  Breanna Ramos was informed that the remainder of the evaluation will be completed by another provider, this initial triage assessment does not replace that evaluation, and the importance of remaining in the ED until their evaluation is complete.     Versie Starks, PA-C 04/14/22 1226

## 2022-04-14 NOTE — ED Provider Notes (Signed)
[facility] Provider Note    Event Date/Time   First MD Initiated Contact with Patient 04/14/22 1240     (approximate)   History   Otalgia   HPI  Breanna Ramos is a 51 y.o. female who notably has a prior history of TIA and cavernous sinus thrombosis for which she takes Eliquis (this occurred about 9 months ago).  She sees Luella Cook, NP, in neurology.  She presents today for evaluation of about a week of a whooshing sound in her ears.  It is bilateral but is worse on the right than it is on the left.  She said that it is much more noticeable if she cups her hands over her ears.  She denies having any pain.  She reports that she has had some stuttering speech since February when she had her initial event but it is no different than before.  She has no new neurological deficits, is ambulating without difficulty, no weakness or numbness in her extremities, no word finding issues, no dizziness or lightheadedness.     Physical Exam   Triage Vital Signs: ED Triage Vitals  Enc Vitals Group     BP 04/14/22 1224 (!) 139/98     Pulse Rate 04/14/22 1224 91     Resp 04/14/22 1224 17     Temp 04/14/22 1224 98.5 F (36.9 C)     Temp Source 04/14/22 1224 Oral     SpO2 04/14/22 1224 95 %     Weight 04/14/22 1225 85.7 kg (189 lb)     Height 04/14/22 1248 1.676 m (5' 6" )     Head Circumference --      Peak Flow --      Pain Score 04/14/22 1225 4     Pain Loc --      Pain Edu? --      Excl. in [place]? --     Most recent vital signs: Vitals:   04/14/22 1224  BP: (!) 139/98  Pulse: 91  Resp: 17  Temp: 98.5 F (36.9 C)  SpO2: 95%     General: Awake, no distress.  Ears:  Bilateral ears are clean, nonerythematous, normal-appearing tympanic membranes. CV:  Good peripheral perfusion.  Normal heart sounds.  Regular rate and rhythm. Resp:  Normal effort.  Lungs are clear to auscultation. Abd:  No distention.  No tenderness to palpation. Neuro:  Normal grip  strength, normal bilateral upper arm strength with biceps and triceps.  No focal neurological deficits of her face.  No dysarthria nor aphasia.  Ambulatory without difficulty, no balance issues.   ED Results / Procedures / Treatments   Labs (all labs ordered are listed, but only abnormal results are displayed) Labs Reviewed  CBC WITH DIFFERENTIAL/PLATELET - Abnormal; Notable for the following components:      Result Value   Hemoglobin 10.3 (*)    HCT 33.0 (*)    MCH 25.1 (*)    Platelets 452 (*)    All other components within normal limits  COMPREHENSIVE METABOLIC PANEL  PROTIME-INR     RADIOLOGY I viewed and interpreted the patient's head CT without contrast.  I see no evidence of acute bleed or other obvious abnormality.  CTA head/neck and CTV head are pending at the time of transfer of care.    PROCEDURES:  Critical Care performed: No  Procedures   MEDICATIONS ORDERED IN ED: Medications - No data to display   IMPRESSION / MDM / ASSESSMENT AND PLAN /  ED COURSE  I reviewed the triage vital signs and the nursing notes.                              Differential diagnosis includes, but is not limited to, nonspecific tenderness, cerebral aneurysm, TIA/CVA, electrolyte or metabolic abnormality, ear infection.  Patient's presentation is most consistent with acute presentation with potential threat to life or bodily function.  Labs/studies ordered: CBC with differential, pro time-INR, CMP, head CT without contrast.  Patient's physical exam is reassuring with no acute neurological deficits.  She has some stuttering speech left over from her prior TIA versus cavernous sinus thrombosis earlier this calendar year, but no deficits now.  She is describing pulsatile tinnitus which is a relatively new finding for her but not consistent with a CVA.  Lab work is all essentially normal and as documented above, her head CT without contrast is normal.  I understand that based on a  phone conversation, her neurology NP recommended that she come to the ED for evaluation with an MRI.  However it is unclear to me if there is a clear benefit to MR imaging given no focal neurological deficits, and I am uncertain exactly what imaging to order.  I will consult Dr. Quinn Axe with neurology to discuss the case and ask for additional recommendations.   Clinical Course as of 04/14/22 1550  Mon Apr 14, 2022  1450 Awaiting word back from Dr. Quinn Axe with neurology. [CF]  1510 I consulted by phone with Dr. Quinn Axe with neurology who also reviewed the chart and we discussed the case in detail.  Given that the patient has no acute neurological deficits, she agrees with me that there is little benefit to obtaining an MRI.  However, given the possibility that the patient could have an aneurysm causing pulsatile tinnitus, she recommends a CTA head/neck.  She also recommends a CTV given that she has a known cavernous sinus thrombosis and if that is worsened could cause increased intracranial pressure which could lead to pulsatile tinnitus.  I will update the patient regarding the plan of care and I am transferring ED care to Dr. Corky Downs to follow-up on the imaging results.  Dr. Quinn Axe will be available until 8 PM to follow-up with additional questions. [CF]  1550 Discussed case in person with Dr. Corky Downs who will follow-up on the imaging and/or with neurology as needed. [CF]    Clinical Course User Index [CF] Hinda Kehr, MD     FINAL CLINICAL IMPRESSION(S) / ED DIAGNOSES   Final diagnoses:  Pulsatile tinnitus     Rx / DC Orders   ED Discharge Orders     None        Note:  This document was prepared using Dragon voice recognition software and may include unintentional dictation errors.   Hinda Kehr, MD 04/14/22 [phone]

## 2022-04-14 NOTE — ED Triage Notes (Signed)
Pt sts that she has been having a " swooshing" sound in her ear. Pt was advised to come and get a MRI since she has had a previous TIA in the past. Pt is A/Ox4, ambulatory, VS WNL

## 2022-04-16 ENCOUNTER — Other Ambulatory Visit: Payer: Self-pay

## 2022-04-16 ENCOUNTER — Ambulatory Visit
Admission: RE | Admit: 2022-04-16 | Discharge: 2022-04-16 | Disposition: A | Discharge status: Home / Self Care | Payer: Self-pay | Source: Ambulatory Visit | Attending: Orthopedic Surgery | Admitting: Orthopedic Surgery

## 2022-04-16 DIAGNOSIS — Z049 Encounter for examination and observation for unspecified reason: Secondary | ICD-10-CM

## 2022-04-21 ENCOUNTER — Encounter: Payer: BLUE CROSS/BLUE SHIELD | Admitting: Speech Pathology

## 2022-04-28 ENCOUNTER — Encounter: Payer: BLUE CROSS/BLUE SHIELD | Admitting: Speech Pathology

## 2022-04-28 NOTE — Progress Notes (Unsigned)
Referring Physician:  Gladstone Lighter, MD [place],  [place] [address]  Primary Physician:  Gladstone Lighter, MD  History of Present Illness: 04/29/2022 Breanna Ramos has a history of anxiety, depression, DM, pituitary tumor, SVT, FM, and TIA secondary to venous sinus thrombosis on 07/02/21 with memory difficulty.   History of cervical surgery about 10 years ago and lumbar surgery (lumbar [place] L3-L4) [place] Donavan Burnet in [place]). [place]. She has recently moved to this area.   She had improvement in her pain after her back surgery, she had right leg pain preop.   She has more constant LBP with constant right lateral leg pain to her knee x 6 months. No left leg pain. LBP = right leg pain. Pain is worse with prolonged sitting/standing. Some relief with frequent changing of positions. She has some numbness in both legs that's been present since her surgery. She notes weakness in right leg.   Some improvement with chiropractor.   She is on ELIQUIS.   She smokes over a pack of cigarettes per day (about 1/2 ppd).   She had some urinary urgency a few months ago, it is getting better.   Conservative measures:  Physical therapy: Nothing recent. Has seen chiropractor.   Multimodal medical therapy including regular antiinflammatories: neurontin, celebrex, flexeril, opana Injections: No recent epidural steroid injections  Past Surgery: History of cervical surgery 10 years ago and lumbar surgery [place] L3-L4) [place]   Breanna Ramos has some chronic balance issues. No other symptoms of cervical myelopathy.  The symptoms are causing a significant impact on the patient's life.   Review of Systems:  A 10 point review of systems is negative, except for the pertinent positives and negatives detailed in the HPI.  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   Nicotine dependence    Normocytic anemia    OSA on CPAP     Past Surgical History: Past Surgical History:  Procedure Laterality Date   BACK SURGERY     x 2   CARDIAC ELECTROPHYSIOLOGY STUDY AND ABLATION     CHOLECYSTECTOMY     COLONOSCOPY WITH PROPOFOL N/A 03/07/2020   Procedure: COLONOSCOPY WITH PROPOFOL;  Surgeon: Lin Landsman, MD;  Location: [facility] ENDOSCOPY;  Service: Gastroenterology;  Laterality: N/A;   FRACTURE SURGERY     Left Ankle    Allergies: Allergies as of 04/29/2022 - Review Complete 04/14/2022  Allergen Reaction Noted   Codeine Other (See Comments) 03/07/2019    Medications: Outpatient Encounter Medications as of 04/29/2022  Medication Sig   apixaban (ELIQUIS) 5 MG TABS tablet Take 2 tablets (10 mg total) by mouth 2 (two) times daily. From 07/10/2021 take 5 mg (1 tab) twice a day   ascorbic acid (VITAMIN C) 500 MG tablet Take 500 mg by mouth daily.    atorvastatin (LIPITOR) 80 MG tablet Take 1 tablet (80 mg total) by mouth daily.   bisoprolol (ZEBETA) 10 MG tablet Take  10 mg by mouth daily.   buPROPion (WELLBUTRIN XL) 300 MG 24 hr tablet Take 300 mg by mouth daily.    cabergoline (DOSTINEX) 0.5 MG tablet Take 0.5 mg by mouth 2 (two) times a week. Tuesday/Saturday   celecoxib (CELEBREX) 200 MG capsule Take 200 mg by mouth at bedtime.    Cholecalciferol 50 MCG (2000 UT) CAPS Take 2,000 Units by mouth daily.    clonazePAM (KLONOPIN) 0.5 MG tablet Take 0.5 mg by mouth 2 (two) times daily as needed.   cyclobenzaprine (FLEXERIL) 10 MG tablet Take 10 mg by mouth 3 (three) times daily as needed for muscle spasms.    diltiazem (TIAZAC) 240 MG 24 hr capsule Take 240 mg by mouth daily.    FLUoxetine (PROZAC) 40 MG capsule Take 40 mg by mouth daily.   gabapentin (NEURONTIN) 300 MG capsule Take 300 mg by mouth daily.    loratadine (CLARITIN) 10 MG tablet Take 10 mg by mouth daily.    Magnesium Oxide 500 MG TABS Take 1 tablet by mouth daily.   Melatonin 5 MG CAPS Take 5 mg by mouth at bedtime.    mesalamine (CANASA) 1000 MG suppository Place 1 suppository (1,000 mg total) rectally at bedtime. (Patient not taking: Reported on 08/13/2021)   metFORMIN (GLUCOPHAGE) 500 MG tablet Take 1,000 mg by mouth 2 (two) times daily with a meal.   Multiple Vitamin (MULTI-VITAMIN) tablet Take 1 tablet by mouth daily.   ondansetron (ZOFRAN) 4 MG tablet Take 4 mg by mouth every 12 (twelve) hours as needed for nausea or vomiting.   oxyCODONE-acetaminophen (PERCOCET) 10-325 MG tablet Take 1 tablet by mouth every 6 (six) hours as needed for pain.    pantoprazole (PROTONIX) 40 MG tablet Take 1 tablet (40 mg total) by mouth 2 (two) times daily before a meal.   ranitidine (ZANTAC) 75 MG tablet Take 75 mg by mouth daily as needed for heartburn. Pt takes for nausea prn   varenicline (CHANTIX PAK) 0.5 MG X 11 & 1 MG X 42 tablet See admin instructions. follow package directions   venlafaxine XR (EFFEXOR-XR) 75 MG 24 hr capsule Take 75 mg by mouth daily.   No facility-administered encounter medications on file as of 04/29/2022.    Social History: Social History   Tobacco Use   Smoking status: Every Day    Packs/day: 0.25    Types: Cigarettes   Smokeless tobacco: Never  Substance Use Topics   Alcohol use: Yes    Comment: ~1 beer/week   Drug use: Never    Family Medical History: Family History  Problem Relation Age of Onset   Stroke Mother    Diabetes Mother    Heart disease Mother    Breast cancer Mother 22   Stroke Father     Physical Examination: There were no vitals filed for this visit.  General: Patient is well  developed, well nourished, calm, collected, and in no apparent distress. Attention to examination is appropriate.  Respiratory: Patient is breathing without any difficulty.   NEUROLOGICAL:     Awake, alert, oriented to person, place, and time.  Speech is clear and fluent. Fund of knowledge is appropriate.   Cranial Nerves: Pupils equal round and reactive to light.  Facial tone is symmetric.    ROM of spine:  good ROM of lumbar spine with mild pain  No abnormal lesions on exposed skin.   Strength: Side Biceps Triceps Deltoid Interossei Grip Wrist Ext. Wrist Flex.  R 5 5 5  5  5 5 5   L 5 5 5 5 5 5 5    Side Iliopsoas Quads Hamstring PF DF EHL  R 5 5 5 5 5 5   L 5 5 5 5 5 5    Reflexes are 2+ and symmetric at the biceps, triceps, brachioradialis, patella and achilles.   Hoffman's is absent.  Clonus is not present.   Bilateral upper and lower extremity sensation is intact to light touch.     Gait is normal.    Well healed lumbar incision. Diffuse lower lumbar tenderness. Tender over Right SI joint.    Medical Decision Making  Imaging: Lumbar xrays 03/12/22:  Instrumented fusion L3-L4 with hardware intact.   Xray report not available for my review. Xrays reviewed with Dr. Izora Ribas.   MRI of lumbar spine dated 03/14/22:  FINDINGS: Segmentation: There are five lumbar type vertebral bodies. The last full intervertebral disc space is labeled L5-S1.   Alignment:  Normal   Vertebrae: Normal marrow signal. No bone lesions or fractures. Moderate artifact associated with the posterior and interbody fusion hardware at L3-4. Limbus vertebrae noted at L1.   Conus medullaris and cauda equina: Conus extends to the T12 level. Conus and cauda equina appear normal.   Paraspinal and other soft tissues: No significant paraspinal or retroperitoneal findings.   Disc levels:   T12-L1: No significant findings.   L1-2: No significant findings.   L2-3: Mild annular bulge and mild to  moderate facet disease but no disc protrusions, spinal or foraminal stenosis.   L3-4: Posterior and interbody fusion changes. Decompressive laminectomy. No spinal or foraminal stenosis. Moderate hypertrophic facet disease, left greater than right.   L4-5: Mild facet disease but no disc protrusions, spinal or foraminal stenosis.   L5-S1: No significant findings.   IMPRESSION: 1. Posterior and interbody fusion changes at L3-4 with decompressive laminectomy. No spinal or foraminal stenosis. 2. Mild annular bulge at L2-3 but no disc protrusions, spinal or foraminal stenosis.     Electronically Signed   By: Marijo Sanes M.D.   On: 03/14/2022 14:04  I have personally reviewed the images and agree with the above interpretation.  Assessment and Plan: Breanna Ramos is a pleasant 51 y.o. female with a history of cervical surgery 10 years ago and [place] L3-L4[place] She did well after her lumbar surgery with some lingering numbness in bilateral lateral upper thighs.   6 month history of more constant LBP with constant right lateral leg pain to her knee. No left leg pain. LBP = right leg pain. Pain is worse with prolonged sitting/standing.  Her fusion looks good on imaging. She has known lumbar spondylosis with facet hypertrophy. LBP likely multifactorial. May have element of pain from right SI joint as well.   Treatment options discussed with patient and following plan made:   - Order for physical therapy for lumbar spine. [facility]. She will call to schedule a visit.  - Referral to PMR at [facility] to discuss possible injections (lumbar facet versus right SI joint injection.  - [place].  - Follow up with me in 6-8 weeks for phone visit.   I spent a total of 30 minutes in face-to-face and non-face-to-face activities related to this patient's care toda including review of outside records, review of  imaging, review of symptoms, physical exam, discussion of differential diagnosis, discussion of treatment options, and documentation.   Thank you for involving me in  the care of this patient.   Geronimo Boot PA-C Dept. of Neurosurgery

## 2022-04-29 ENCOUNTER — Ambulatory Visit (INDEPENDENT_AMBULATORY_CARE_PROVIDER_SITE_OTHER): Payer: No Typology Code available for payment source | Admitting: Orthopedic Surgery

## 2022-04-29 ENCOUNTER — Encounter: Payer: Self-pay | Admitting: Orthopedic Surgery

## 2022-04-29 VITALS — BP 140/82 | Ht 66.0 in | Wt 188.0 lb

## 2022-04-29 DIAGNOSIS — M47816 Spondylosis without myelopathy or radiculopathy, lumbar region: Secondary | ICD-10-CM

## 2022-04-29 DIAGNOSIS — M533 Sacrococcygeal disorders, not elsewhere classified: Secondary | ICD-10-CM

## 2022-04-29 DIAGNOSIS — Z981 Arthrodesis status: Secondary | ICD-10-CM | POA: Diagnosis not present

## 2022-04-29 NOTE — Patient Instructions (Signed)
It was so nice to see you today, I am sorry that you are hurting so much.   You have some wear and tear (arthritis) in your back. Some of your right buttock pain may also be from your SI joint.   I sent physical therapy orders to the [facility]. You can call them at [phone] to schedule.   I put in a referral for you to see Physical Medicine and Rehab at the [facility] to discuss possible injections. They should call you or you can call them at [phone].    I will see you back in 8 weeks for a phone visit. Please do not hesitate to call if you have any questions or concerns. You can also message me in [place].   If you have not heard back about any of the tests/procedures in the next week, please call the office so we can help you get these things scheduled.   Geronimo Boot PA-C [phone]

## 2022-05-05 ENCOUNTER — Encounter: Payer: BLUE CROSS/BLUE SHIELD | Admitting: Speech Pathology

## 2022-05-12 ENCOUNTER — Encounter: Payer: BLUE CROSS/BLUE SHIELD | Admitting: Speech Pathology

## 2022-06-13 IMAGING — US US ABDOMEN LIMITED
1 series · 14 of 25 positions shown · non-contrast
Comparison: None Available.

CLINICAL DATA: Elevated LFTs

EXAM:
ULTRASOUND ABDOMEN LIMITED RIGHT UPPER QUADRANT

[Series 1: us abdomen limited · 0.26mm/px · 14 of 34 slices shown]
[im 1/34]
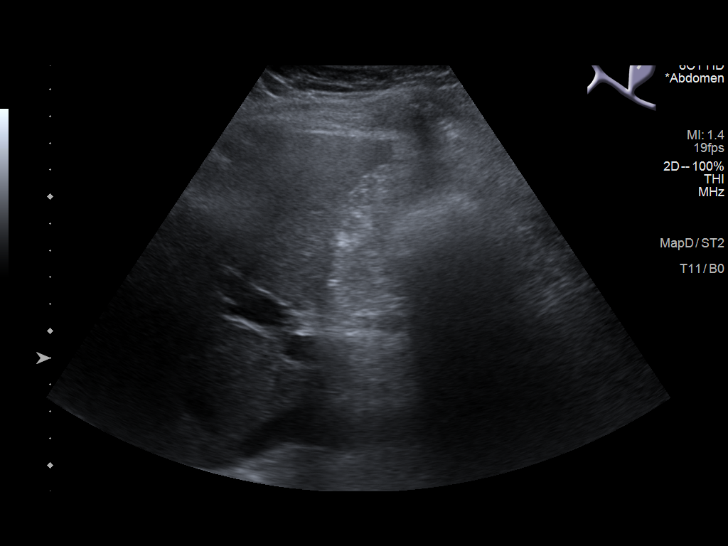
[im 3/34]
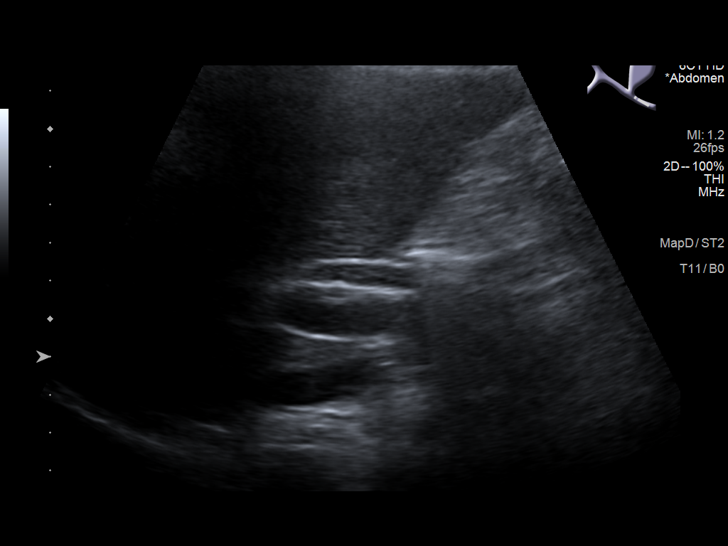
[im 6/34]
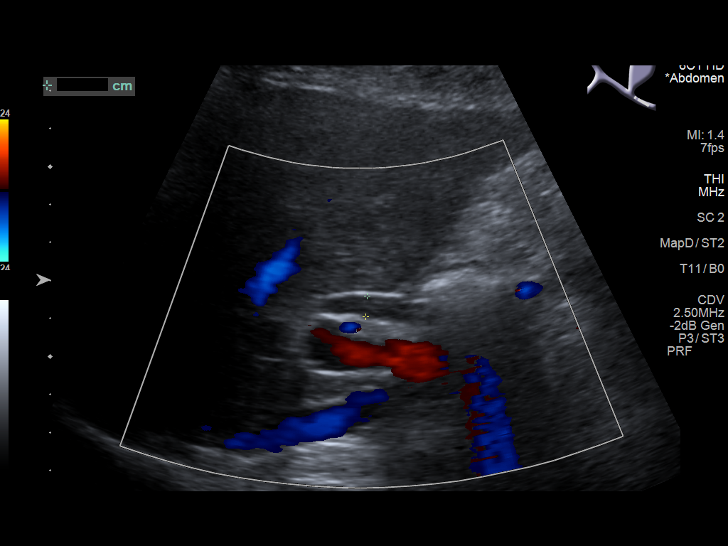
[im 9/34]
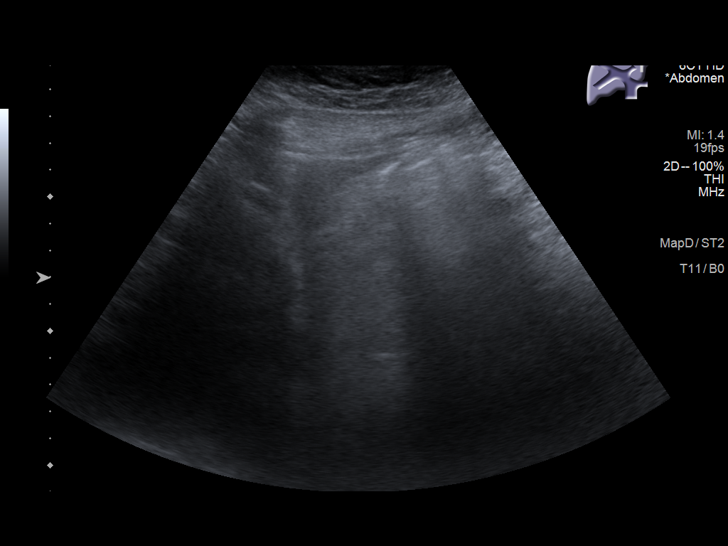
[im 12/34]
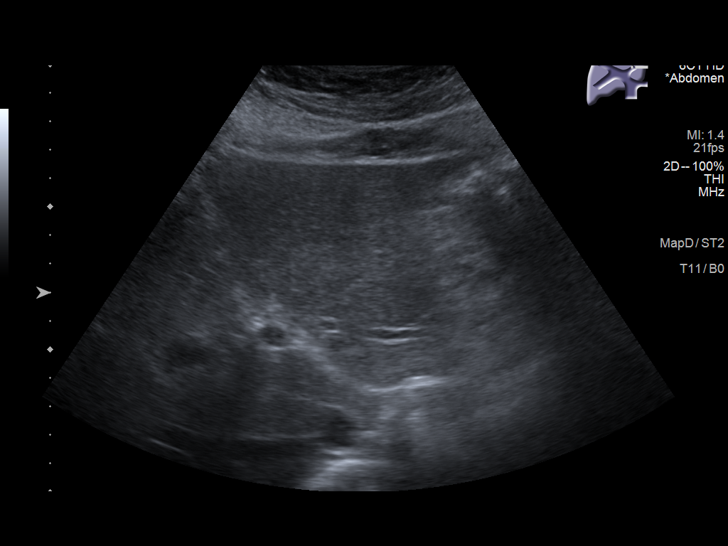
[im 13/34]
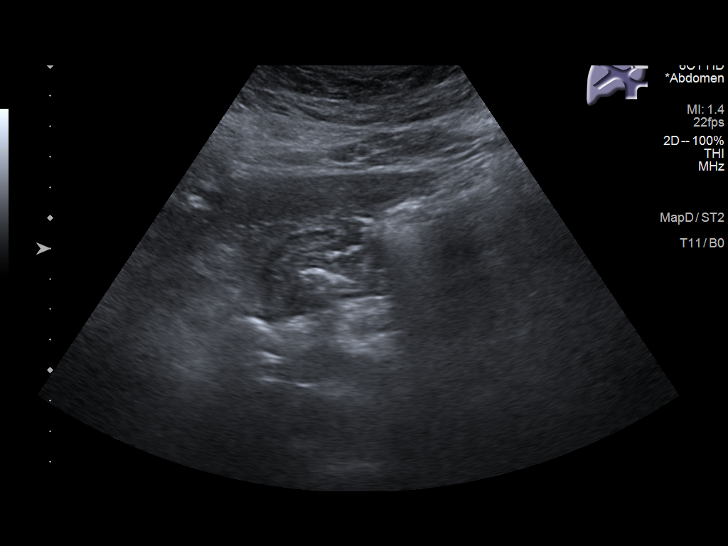
[im 16/34]
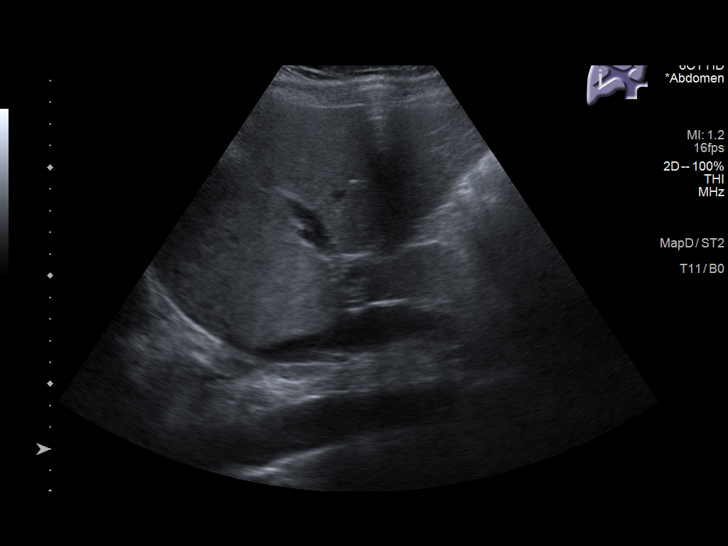
[im 18/34]
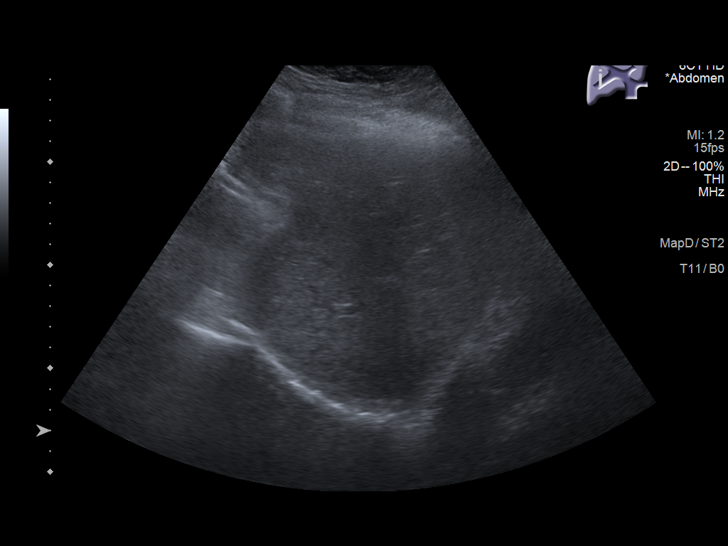
[im 21/34]
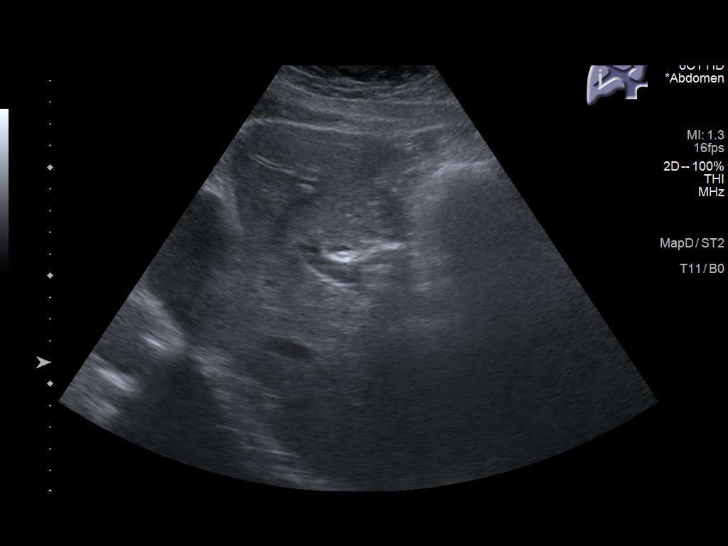
[im 23/34]
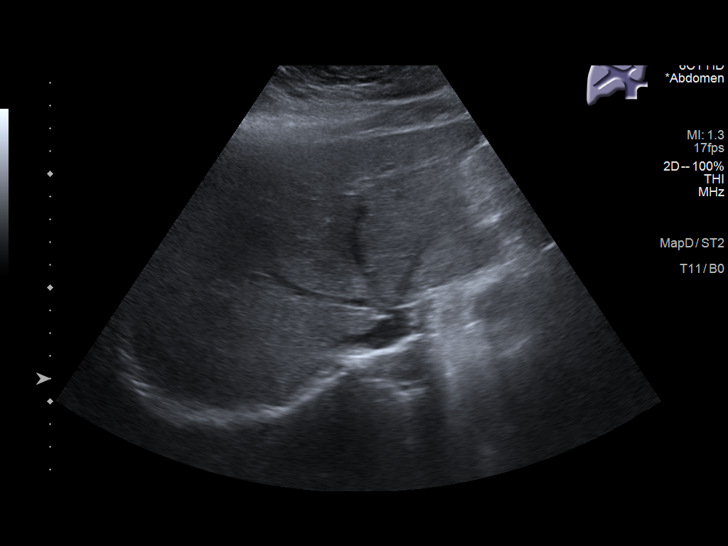
[im 25/34]
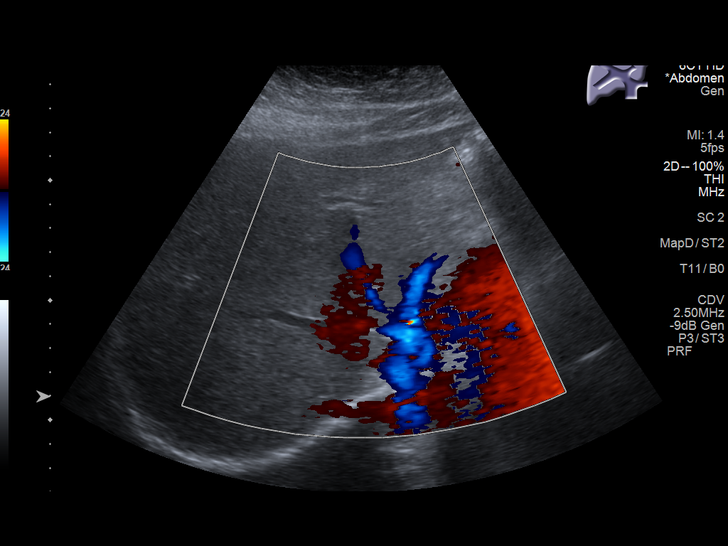
[im 28/34]
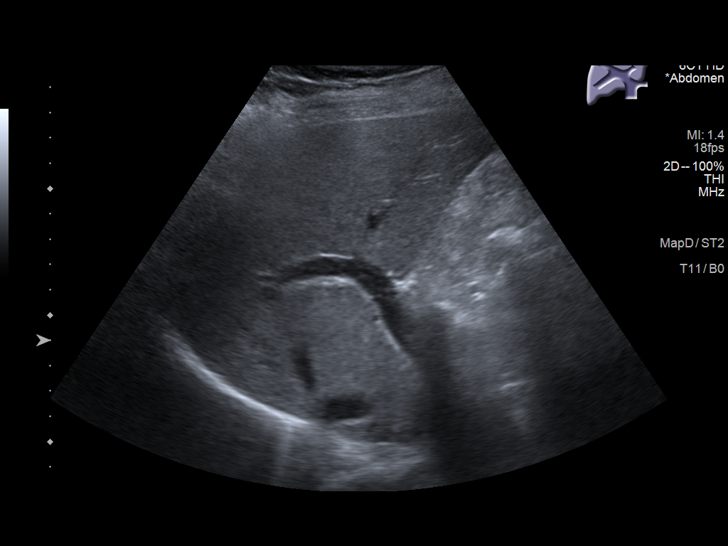
[im 31/34]
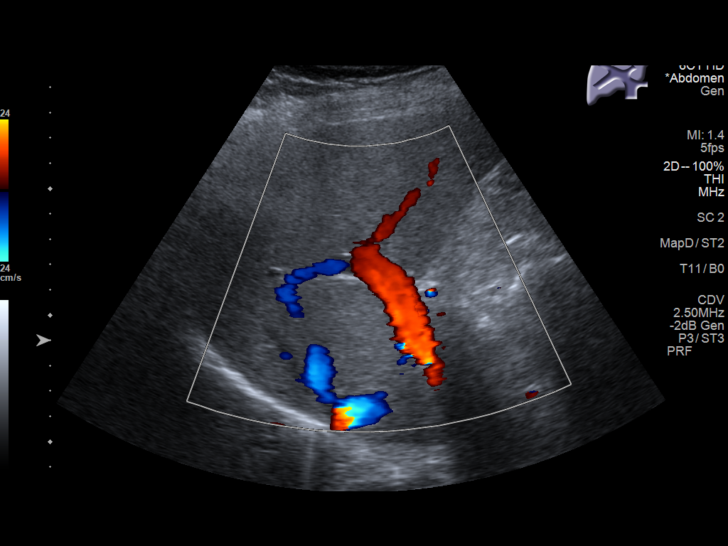
[im 34/34]
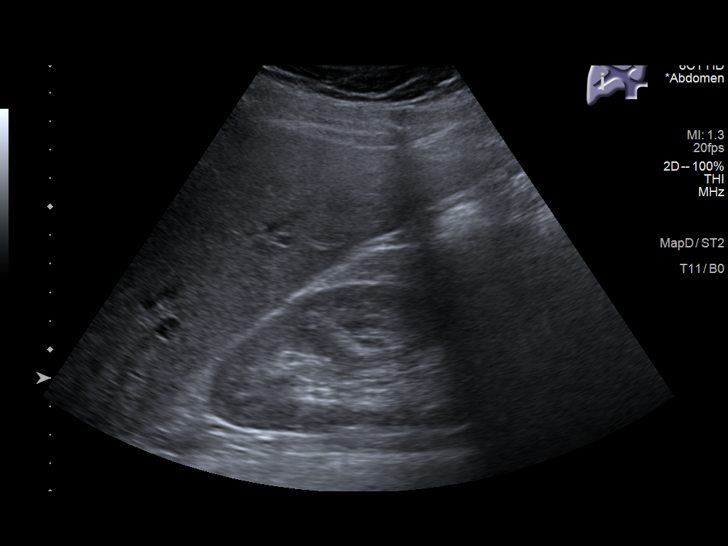

[14 of 25 positions shown; findings below may reference images not displayed]

FINDINGS: Gallbladder:

Surgically absent.

Common bile duct:

Diameter: 5 mm

Liver:

Increased echogenicity of the parenchyma with no focal mass
identified. Portal vein is patent on color Doppler imaging with
normal direction of blood flow towards the liver.

Other: None.
IMPRESSION: Increased echogenicity of the liver parenchyma suggesting hepatic
steatosis.

## 2022-06-17 NOTE — Progress Notes (Signed)
Telephone Visit- Progress Note: Referring Physician:  Gladstone Lighter, MD [place],  [place] [address]  Primary Physician:  Gladstone Lighter, MD  This visit was performed via telephone.  Patient location: home Provider location: office  I spent a total of 20 minutes non-face-to-face activities for this visit on the date of this encounter including review of current clinical condition and response to treatment.    Patient has given verbal consent to this telephone visits and we reviewed the limitations of a telephone visit. Patient wishes to proceed.    Chief Complaint:  f/u after PT/injection  History of Present Illness: Ms. Breanna Ramos has a history of anxiety, depression, DM, pituitary tumor, SVT, FM, and TIA secondary to venous sinus thrombosis on 07/02/21 with memory difficulty.   History of cervical surgery about 10 years ago and lumbar surgery (lumbar fusion L3-L4) [place] (Dr. Donavan Burnet in [place]). She has recently moved to this area.   Last seen by me on 04/29/22 for back and right leg pain. Her fusion looked good on imaging. She has known lumbar spondylosis with facet hypertrophy. LBP likely multifactorial. May have element of pain from right SI joint as well.   She was sent to PT and referred to [facility] for lumbar facet versus right SI joint injection. She is now seeing pain management in [facility]) and they have taken over her care.   She had caudal ESI with Dr. Felix Ahmadi ([facility] in [place]) on 06/06/22. She has not seen much relief with her pain since that injection.   She continues with more constant LBP with constant right lateral leg pain to her knee. She has new intermittent left lateral leg pain to her medial shin. Pain is worse with prolonged sitting/standing. She has some numbness in both legs that's been present since her surgery.   She is on ELIQUIS.   She smokes over a pack of cigarettes per  day (about 1/2 ppd).   Conservative measures:  Physical therapy: has done initial eval with PT and one visit.  Multimodal medical therapy including regular antiinflammatories: neurontin, celebrex, flexeril, opana Injections:  caudal ESI with Dr. Felix Ahmadi [facility] in [place]) on 06/06/22.  Past Surgery: History of cervical surgery 10 years ago and lumbar [place] L3-L4[place]   Breanna Ramos has some chronic balance issues. No other symptoms of cervical myelopathy.  The symptoms are causing a significant impact on the patient's life.   Exam: No exam done as this was a telephone encounter.     Imaging: No new lumbar imaging.   I have personally reviewed the images and agree with the above interpretation.  Assessment and Plan: Breanna Ramos is a pleasant 52 y.o. female has a history of lumbar fusion L3-L4 about [place] (Dr. Donavan Burnet in [place]).   She continues with more constant LBP with constant right lateral leg pain to her knee. She has new intermittent left lateral leg pain to her medial shin.   No relief with recent caudal ESI.   She has known lumbar spondylosis with facet hypertrophy. LBP likely multifactorial. May have element of pain from right SI joint as well.   Treatment options discussed with patient and following plan made:   - Message sent to NP at [facility] [employer] [facility] [place])- no improvement with caudal ESI. May consider facet injections and/or SI joint injection.  - Continue with  PT for lumbar spine.  - Will message her in 6 weeks to check on her. If no improvement with above, may consider CT scan of lumbar spine to evaluate fusion healing.   Geronimo Boot PA-C Neurosurgery

## 2022-06-24 ENCOUNTER — Encounter: Payer: Self-pay | Admitting: Orthopedic Surgery

## 2022-06-24 ENCOUNTER — Ambulatory Visit (INDEPENDENT_AMBULATORY_CARE_PROVIDER_SITE_OTHER): Payer: No Typology Code available for payment source | Admitting: Orthopedic Surgery

## 2022-06-24 DIAGNOSIS — Z981 Arthrodesis status: Secondary | ICD-10-CM | POA: Diagnosis not present

## 2022-06-24 DIAGNOSIS — M47816 Spondylosis without myelopathy or radiculopathy, lumbar region: Secondary | ICD-10-CM | POA: Diagnosis not present

## 2022-06-24 DIAGNOSIS — M533 Sacrococcygeal disorders, not elsewhere classified: Secondary | ICD-10-CM

## 2022-07-03 ENCOUNTER — Inpatient Hospital Stay: Payer: No Typology Code available for payment source | Attending: Oncology | Admitting: Oncology

## 2022-07-03 DIAGNOSIS — G08 Intracranial and intraspinal phlebitis and thrombophlebitis: Secondary | ICD-10-CM | POA: Diagnosis not present

## 2022-07-03 DIAGNOSIS — D509 Iron deficiency anemia, unspecified: Secondary | ICD-10-CM | POA: Diagnosis not present

## 2022-07-03 NOTE — Progress Notes (Signed)
[place]  [phone] [phone]  ID: Breanna Ramos OB: [birth year]  MR#: [id number]  [id number]  Patient Care Team: Gladstone Lighter, MD as PCP - General (Internal Medicine) Lloyd Huger, MD as Consulting Physician (Hematology and Oncology)  I connected with Breanna Ramos on 07/05/22 at  3:30 PM EST by video enabled telemedicine visit and verified that I am speaking with the correct person using two identifiers.   I discussed the limitations, risks, security and privacy concerns of performing an evaluation and management service by telemedicine and the availability of in-person appointments. I also discussed with the patient that there may be a patient responsible charge related to this service. The patient expressed understanding and agreed to proceed.   Other persons participating in the visit and their role in the encounter: Patient, MD.  Patient's location: Home. Provider's location: Clinic.  CHIEF COMPLAINT: Cerebral sinus thrombosis, iron deficiency anemia.  INTERVAL HISTORY: Patient agreed to video assisted telemedicine visit for further evaluation and discussion of whether or not to continue anticoagulation.  She was also recently found to have iron deficiency anemia.  She currently feels well and is asymptomatic.  She has no neurologic complaints.  She denies any recent fevers.  She has a good appetite and denies weight loss.  She has no chest pain, shortness of breath, cough, or hemoptysis.  She denies any nausea, vomiting, constipation, or diarrhea.  She has no urinary complaints.  Patient offers no specific complaints today.  REVIEW OF SYSTEMS:   Review of Systems  Constitutional: Negative.  Negative for fever, malaise/fatigue and weight loss.  Respiratory: Negative.  Negative for cough, hemoptysis and shortness of breath.   Cardiovascular: Negative.  Negative for chest pain and leg swelling.  Gastrointestinal: Negative.   Negative for abdominal pain.  Genitourinary: Negative.  Negative for dysuria.  Musculoskeletal: Negative.  Negative for back pain.  Skin: Negative.  Negative for rash.  Neurological:  Negative for dizziness, speech change, focal weakness, weakness and headaches.  Psychiatric/Behavioral:  The patient is not nervous/anxious.     As per HPI. Otherwise, a complete review of systems is negative.  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   Nicotine dependence    Normocytic anemia    OSA on CPAP     PAST SURGICAL HISTORY: Past Surgical History:  Procedure Laterality Date   BACK SURGERY     x 2   CARDIAC ELECTROPHYSIOLOGY STUDY AND ABLATION     CHOLECYSTECTOMY     COLONOSCOPY WITH PROPOFOL N/A 03/07/2020   Procedure: COLONOSCOPY WITH PROPOFOL;  Surgeon: Lin Landsman, MD;  Location: [facility] ENDOSCOPY;  Service: Gastroenterology;  Laterality: N/A;   FRACTURE SURGERY     Left Ankle    FAMILY HISTORY: Family History  Problem Relation Age of Onset   Stroke Mother    Diabetes Mother    Heart disease Mother    Breast cancer Mother 43   Stroke Father     ADVANCED DIRECTIVES (Y/N):  N  HEALTH MAINTENANCE: Social History   Tobacco Use  Smoking status: Every Day    Packs/day: 0.25    Types: Cigarettes   Smokeless tobacco: Never  Substance Use Topics   Alcohol use: Yes    Comment: ~1 beer/week   Drug use: Never     Colonoscopy:  PAP:  Bone density:  Lipid panel:  Allergies  Allergen Reactions   Codeine Other (See Comments)    Cough syrup with codeine caused hyperactivity     Current Outpatient Medications  Medication Sig Dispense Refill   apixaban (ELIQUIS) 5 MG TABS tablet Take 2 tablets (10 mg total) by mouth 2 (two) times daily. From 07/10/2021 take 5 mg (1 tab) twice a day 60 tablet 3   ascorbic acid (VITAMIN C) 500 MG tablet Take 500 mg by mouth daily.      atorvastatin (LIPITOR) 80 MG tablet Take 1 tablet (80 mg total) by mouth daily. 30 tablet 3   cabergoline (DOSTINEX) 0.5 MG tablet Take 0.5 mg by mouth 2 (two) times a week. Tuesday/Saturday     celecoxib (CELEBREX) 200 MG capsule Take 200 mg by mouth at bedtime.      Cholecalciferol 50 MCG (2000 UT) CAPS Take 2,000 Units by mouth daily.      cyclobenzaprine (FLEXERIL) 10 MG tablet Take 10 mg by mouth 3 (three) times daily as needed for muscle spasms.     diltiazem (TIAZAC) 240 MG 24 hr capsule Take 240 mg by mouth daily.      famciclovir (FAMVIR) 500 MG tablet Take 500 mg by mouth every 8 (eight) hours as needed.     FLUoxetine (PROZAC) 40 MG capsule Take 40 mg by mouth daily.     gabapentin (NEURONTIN) 300 MG capsule Take 300 mg by mouth daily.      lamoTRIgine (LAMICTAL) 200 MG tablet Take 200 mg by mouth daily.     lamoTRIgine (LAMICTAL) 25 MG tablet Take 50 mg by mouth at bedtime.     loratadine (CLARITIN) 10 MG tablet Take 10 mg by mouth daily.      Magnesium Oxide 500 MG TABS Take 1 tablet by mouth daily.     mesalamine (CANASA) 1000 MG suppository Place 1 suppository (1,000 mg total) rectally at bedtime. (Patient not taking: Reported on 08/13/2021) 30 suppository 1   metFORMIN (GLUCOPHAGE) 500 MG tablet Take 1,000 mg by mouth 2 (two) times daily with a meal.     Multiple Vitamin (MULTI-VITAMIN) tablet Take 1 tablet by mouth daily.     ondansetron (ZOFRAN) 4 MG tablet Take 4 mg by mouth every 12 (twelve) hours as needed for nausea or vomiting.     oxyCODONE-acetaminophen (PERCOCET) 10-325 MG tablet Take 1 tablet by mouth every 6 (six) hours as needed for pain.      pantoprazole (PROTONIX) 40 MG  tablet Take 1 tablet (40 mg total) by mouth 2 (two) times daily before a meal. 180 tablet 3   propranolol (INDERAL) 20 MG tablet Take 20 mg by mouth 2 (two) times daily.     tranexamic acid (LYSTEDA) 650 MG TABS tablet Take 1,300 mg by mouth 3 (three) times daily.     traZODone (DESYREL) 50 MG tablet Take 50-100 mg by mouth at bedtime as needed.     varenicline (CHANTIX PAK) 0.5 MG X 11 & 1 MG X 42 tablet See admin instructions. follow package directions     No current facility-administered medications for this visit.    OBJECTIVE: There were no vitals filed for this visit.    There is  no height or weight on file to calculate BMI.    ECOG FS:0 - Asymptomatic  General: Well-developed, well-nourished, no acute distress. HEENT: Normocephalic. Neuro: Alert, answering all questions appropriately. Cranial nerves grossly intact. Psych: Normal affect.  LAB RESULTS:  Lab Results  Component Value Date   NA 140 04/14/2022   K 3.8 04/14/2022   CL 103 04/14/2022   CO2 28 04/14/2022   GLUCOSE 93 04/14/2022   BUN 15 04/14/2022   CREATININE 0.61 04/14/2022   CALCIUM 9.4 04/14/2022   PROT 7.2 04/14/2022   ALBUMIN 4.2 04/14/2022   AST 21 04/14/2022   ALT 18 04/14/2022   ALKPHOS 68 04/14/2022   BILITOT 0.6 04/14/2022   GFRNONAA >60 04/14/2022   GFRAA >60 02/15/2020    Lab Results  Component Value Date   WBC 8.2 04/14/2022   NEUTROABS 4.9 04/14/2022   HGB 10.3 (L) 04/14/2022   HCT 33.0 (L) 04/14/2022   MCV 80.3 04/14/2022   PLT 452 (H) 04/14/2022   Lab Results  Component Value Date   IRON 35 02/17/2020   TIBC 375 02/17/2020   IRONPCTSAT 9 (LL) 02/17/2020   Lab Results  Component Value Date   FERRITIN 16 02/17/2020     STUDIES: No results found.  ASSESSMENT: Cerebral sinus thrombosis.  PLAN:    Cerebral sinus thrombosis: CT venogram of the head on July 02, 2021 reviewed independently with a 2.5 cm filling defect in the right transverse sinus consistent with thrombus,  although unclear if this was acute or chronic.  Full hypercoagulable workup is negative.  Recommendations for an unprovoked sinus clot is 12 months of treatment and then discontinue.  Patient expressed understanding that while recurrence is unlikely she has approximately 2 to 7% chance of this happening.  No further intervention is needed.  Patient had DVT or other blood clot would recommend lifelong treatment at that time.   Iron deficiency anemia: Patient noted to have a decreased hemoglobin and iron stores.  Return to clinic 5 times over the next 3 weeks to receive 200 mg IV Venofer.  Patient will then return to clinic in 4 months with repeat laboratory work, further evaluation, and continuation of treatment if needed.  I provided 30 minutes of face-to-face video visit time during this encounter which included chart review, counseling, and coordination of care as documented above.    Patient expressed understanding and was in agreement with this plan. She also understands that She can call clinic at any time with any questions, concerns, or complaints.    Lloyd Huger, MD   07/05/2022 7:26 AM

## 2022-07-05 ENCOUNTER — Encounter: Payer: Self-pay | Admitting: Oncology

## 2022-07-05 DIAGNOSIS — D509 Iron deficiency anemia, unspecified: Secondary | ICD-10-CM | POA: Insufficient documentation

## 2022-07-07 ENCOUNTER — Encounter: Payer: Self-pay | Admitting: Oncology

## 2022-07-10 ENCOUNTER — Inpatient Hospital Stay: Payer: No Typology Code available for payment source

## 2022-07-10 VITALS — BP 120/78 | HR 81 | Temp 98.0°F

## 2022-07-10 DIAGNOSIS — D509 Iron deficiency anemia, unspecified: Secondary | ICD-10-CM | POA: Diagnosis not present

## 2022-07-10 MED ORDER — SODIUM CHLORIDE 0.9 % IV SOLN
200.0000 mg | Freq: Once | INTRAVENOUS | Status: AC
  Administered 2022-07-10: 200 mg via INTRAVENOUS
  Filled 2022-07-10: qty 200

## 2022-07-10 MED ORDER — SODIUM CHLORIDE 0.9 % IV SOLN
Freq: Once | INTRAVENOUS | Status: AC
  Filled 2022-07-10: qty 250

## 2022-07-14 ENCOUNTER — Inpatient Hospital Stay: Payer: No Typology Code available for payment source

## 2022-07-14 VITALS — BP 149/93 | HR 84 | Temp 98.1°F | Resp 16

## 2022-07-14 DIAGNOSIS — D509 Iron deficiency anemia, unspecified: Secondary | ICD-10-CM | POA: Diagnosis not present

## 2022-07-14 MED ORDER — SODIUM CHLORIDE 0.9 % IV SOLN
Freq: Once | INTRAVENOUS | Status: AC
  Filled 2022-07-14: qty 250

## 2022-07-14 MED ORDER — SODIUM CHLORIDE 0.9 % IV SOLN
200.0000 mg | Freq: Once | INTRAVENOUS | Status: AC
  Administered 2022-07-14: 200 mg via INTRAVENOUS
  Filled 2022-07-14: qty 200

## 2022-07-15 MED FILL — Iron Sucrose Inj 20 MG/ML (Fe Equiv): INTRAVENOUS | Qty: 10 | Status: AC

## 2022-07-16 ENCOUNTER — Inpatient Hospital Stay: Payer: No Typology Code available for payment source

## 2022-07-16 VITALS — BP 145/95 | HR 96 | Temp 98.9°F | Resp 16

## 2022-07-16 DIAGNOSIS — D509 Iron deficiency anemia, unspecified: Secondary | ICD-10-CM | POA: Diagnosis not present

## 2022-07-16 MED ORDER — SODIUM CHLORIDE 0.9 % IV SOLN
Freq: Once | INTRAVENOUS | Status: AC
  Filled 2022-07-16: qty 250

## 2022-07-16 MED ORDER — SODIUM CHLORIDE 0.9 % IV SOLN
200.0000 mg | Freq: Once | INTRAVENOUS | Status: AC
  Administered 2022-07-16: 200 mg via INTRAVENOUS
  Filled 2022-07-16: qty 200

## 2022-07-21 ENCOUNTER — Inpatient Hospital Stay: Payer: No Typology Code available for payment source

## 2022-07-21 VITALS — BP 121/86 | HR 88 | Resp 16

## 2022-07-21 DIAGNOSIS — D509 Iron deficiency anemia, unspecified: Secondary | ICD-10-CM | POA: Diagnosis not present

## 2022-07-21 MED ORDER — SODIUM CHLORIDE 0.9 % IV SOLN
Freq: Once | INTRAVENOUS | Status: AC
  Filled 2022-07-21: qty 250

## 2022-07-21 MED ORDER — SODIUM CHLORIDE 0.9 % IV SOLN
200.0000 mg | Freq: Once | INTRAVENOUS | Status: AC
  Administered 2022-07-21: 200 mg via INTRAVENOUS
  Filled 2022-07-21: qty 200

## 2022-07-22 MED FILL — Iron Sucrose Inj 20 MG/ML (Fe Equiv): INTRAVENOUS | Qty: 10 | Status: AC

## 2022-07-23 ENCOUNTER — Inpatient Hospital Stay: Payer: No Typology Code available for payment source

## 2022-07-23 VITALS — BP 146/94 | HR 80 | Temp 96.6°F | Resp 17

## 2022-07-23 DIAGNOSIS — D509 Iron deficiency anemia, unspecified: Secondary | ICD-10-CM

## 2022-07-23 MED ORDER — SODIUM CHLORIDE 0.9 % IV SOLN
Freq: Once | INTRAVENOUS | Status: AC
  Filled 2022-07-23: qty 250

## 2022-07-23 MED ORDER — SODIUM CHLORIDE 0.9 % IV SOLN
200.0000 mg | Freq: Once | INTRAVENOUS | Status: AC
  Administered 2022-07-23: 200 mg via INTRAVENOUS
  Filled 2022-07-23: qty 200

## 2022-07-23 MED ORDER — SODIUM CHLORIDE 0.9% FLUSH
10.0000 mL | Freq: Once | INTRAVENOUS | Status: AC | PRN
  Administered 2022-07-23: 10 mL
  Filled 2022-07-23: qty 10

## 2022-07-23 NOTE — Patient Instructions (Signed)

## 2022-09-08 ENCOUNTER — Encounter: Payer: Self-pay | Admitting: Oncology

## 2022-09-10 ENCOUNTER — Other Ambulatory Visit: Payer: Self-pay | Admitting: *Deleted

## 2022-09-10 DIAGNOSIS — D509 Iron deficiency anemia, unspecified: Secondary | ICD-10-CM

## 2022-09-11 ENCOUNTER — Inpatient Hospital Stay: Payer: No Typology Code available for payment source | Attending: Oncology

## 2022-09-11 DIAGNOSIS — D509 Iron deficiency anemia, unspecified: Secondary | ICD-10-CM | POA: Insufficient documentation

## 2022-09-11 LAB — FERRITIN: Ferritin: 15 ng/mL (ref 11–307)

## 2022-09-11 LAB — CBC WITH DIFFERENTIAL/PLATELET
Abs Immature Granulocytes: 0.06 10*3/uL (ref 0.00–0.07)
Basophils Absolute: 0.1 10*3/uL (ref 0.0–0.1)
Basophils Relative: 1 %
Eosinophils Absolute: 0.4 10*3/uL (ref 0.0–0.5)
Eosinophils Relative: 4 %
HCT: 42 % (ref 36.0–46.0)
Hemoglobin: 13.7 g/dL (ref 12.0–15.0)
Immature Granulocytes: 1 %
Lymphocytes Relative: 34 %
Lymphs Abs: 3.7 10*3/uL (ref 0.7–4.0)
MCH: 28.5 pg (ref 26.0–34.0)
MCHC: 32.6 g/dL (ref 30.0–36.0)
MCV: 87.5 fL (ref 80.0–100.0)
Monocytes Absolute: 0.9 10*3/uL (ref 0.1–1.0)
Monocytes Relative: 9 %
Neutro Abs: 5.8 10*3/uL (ref 1.7–7.7)
Neutrophils Relative %: 51 %
Platelets: 400 10*3/uL (ref 150–400)
RBC: 4.8 MIL/uL (ref 3.87–5.11)
RDW: 20.3 % — ABNORMAL HIGH (ref 11.5–15.5)
WBC: 10.9 10*3/uL — ABNORMAL HIGH (ref 4.0–10.5)
nRBC: 0 % (ref 0.0–0.2)

## 2022-09-11 LAB — IRON AND TIBC
Iron: 58 ug/dL (ref 28–170)
Saturation Ratios: 14 % (ref 10.4–31.8)
TIBC: 410 ug/dL (ref 250–450)
UIBC: 352 ug/dL

## 2022-10-09 ENCOUNTER — Ambulatory Visit: Payer: No Typology Code available for payment source | Admitting: Internal Medicine

## 2022-10-09 ENCOUNTER — Encounter: Payer: Self-pay | Admitting: Internal Medicine

## 2022-10-09 VITALS — BP 144/80 | HR 76 | Ht 66.0 in | Wt 198.0 lb

## 2022-10-09 DIAGNOSIS — D352 Benign neoplasm of pituitary gland: Secondary | ICD-10-CM

## 2022-10-09 DIAGNOSIS — E236 Other disorders of pituitary gland: Secondary | ICD-10-CM | POA: Diagnosis not present

## 2022-10-09 MED ORDER — CABERGOLINE 0.5 MG PO TABS: 0.2500 mg | ORAL_TABLET | ORAL | 2 refills | Status: DC

## 2022-10-09 NOTE — Patient Instructions (Signed)
Change Cabergoline 0.5, HALF  tablet twice weekly

## 2022-10-09 NOTE — Progress Notes (Signed)
Name: Breanna Ramos  MRN/ DOB: [id number], December 30, 1970    Age/ Sex: 52 y.o., female    PCP: Enid Baas, MD   Reason for Endocrinology Evaluation: Pituitary adenoma     Date of Initial Endocrinology Evaluation: 10/09/2022     HPI: Breanna Ramos is a 52 y.o. female with a past medical history of sinus venous thrombosis, anxiety, depression, pituitary adenoma, SVT, fibromyalgia, bipolar d/o . The patient presented for initial endocrinology clinic visit on 10/09/2022 for consultative assistance with her pituitary adenoma.    Patient developed galactorrhea in the summer 2015 as well as 1 year of amenorrhea at the time..  Prolactin levels were in the 100s, MRI showed 5 mm tumor of the pituitary gland.  She was started on cabergoline at the time, she had a small visual field defect in March 2017, this worsened by October 2017.  Brain MRI showed decrease in pituitary gland from 5 mm to 3 mm  The size of prolactinoma did not fit the great visual field deficit that the patient had at the time.   MRI 2023 did not show pituitary adenoma, but was noted with partial empty Sella   She is on cabergoline 0.5 mg twice weekly  ( Tuesday and Saturdays)   She was on Prozac only at the time of diagnosis   She follows neurology  She also follows with Hematology / Oncology , off anti-angulation  06/2022 Follows with psychiatry   Denies headaches  Denies recent galactorrhea  Cabergoline causes nausea and vomiting  She would like to change it to Bromocriptine  Has chronic constipation  Denies palpitations, S/P cardiac ablation in 2019  due to SVT  She continues with easy bruising despite being off anti-coagulation   Recently she had had  glucocorticoids through back injection and foot Glucocorticoids injection 5/8th  February , 2024 took Medrol pak for back pain    Has hx left ankle/foot fracture with ORIF  Scheduled for hysterectomy 7/5th      HISTORY:  Past Medical History:  Past  Medical History:  Diagnosis Date   Acute focal neurologic deficit with complete resolution    Arthritis    Carpal tunnel syndrome    Cerebral venous sinus thrombosis    Chronic, continuous use of opioids    Depression    Diabetes mellitus without complication (HCC)    Fibromyalgia    H/O cardiac radiofrequency ablation    History of rectal bleeding 08/2020   Hypertension    Microprolactinoma (HCC) 2016   Nicotine dependence    Normocytic anemia    OSA on CPAP    Past Surgical History:  Past Surgical History:  Procedure Laterality Date   BACK SURGERY     x 2   CARDIAC ELECTROPHYSIOLOGY STUDY AND ABLATION     CHOLECYSTECTOMY     COLONOSCOPY WITH PROPOFOL N/A 03/07/2020   Procedure: COLONOSCOPY WITH PROPOFOL;  Surgeon: Toney Reil, MD;  Location: [facility] ENDOSCOPY;  Service: Gastroenterology;  Laterality: N/A;   FRACTURE SURGERY     Left Ankle    Social History:  reports that she has been smoking cigarettes. She has been smoking an average of .25 packs per day. She has never used smokeless tobacco. She reports current alcohol use. She reports that she does not use drugs. Family History: family history includes Breast cancer (age of onset: 35) in her mother; Diabetes in her mother; Heart disease in her mother; Stroke in her father and mother.   HOME MEDICATIONS: Allergies  as of 10/09/2022       Reactions   Codeine Other (See Comments)   Cough syrup with codeine caused hyperactivity        Medication List        Accurate as of Oct 09, 2022  8:28 AM. If you have any questions, ask your nurse or doctor.          STOP taking these medications    diltiazem 240 MG 24 hr capsule Commonly known as: CARDIZEM CD Stopped by: Scarlette Shorts, MD       TAKE these medications    apixaban 5 MG Tabs tablet Commonly known as: ELIQUIS Take 2 tablets (10 mg total) by mouth 2 (two) times daily. From 07/10/2021 take 5 mg (1 tab) twice a day   ascorbic acid 500 MG  tablet Commonly known as: VITAMIN C Take 500 mg by mouth daily.   atorvastatin 80 MG tablet Commonly known as: LIPITOR Take 1 tablet (80 mg total) by mouth daily.   cabergoline 0.5 MG tablet Commonly known as: DOSTINEX Take 0.5 mg by mouth 2 (two) times a week. Tuesday/Saturday   celecoxib 200 MG capsule Commonly known as: CELEBREX Take 200 mg by mouth at bedtime.   Cholecalciferol 50 MCG (2000 UT) Caps Take 2,000 Units by mouth daily.   cyclobenzaprine 10 MG tablet Commonly known as: FLEXERIL Take 10 mg by mouth 3 (three) times daily as needed for muscle spasms.   diltiazem 240 MG 24 hr capsule Commonly known as: TIAZAC Take 240 mg by mouth daily.   famciclovir 500 MG tablet Commonly known as: FAMVIR Take 500 mg by mouth every 8 (eight) hours as needed.   FLUoxetine 40 MG capsule Commonly known as: PROZAC Take 40 mg by mouth daily.   gabapentin 300 MG capsule Commonly known as: NEURONTIN Take 300 mg by mouth daily.   lamoTRIgine 200 MG tablet Commonly known as: LAMICTAL Take 200 mg by mouth daily.   lamoTRIgine 25 MG tablet Commonly known as: LAMICTAL Take 50 mg by mouth at bedtime.   loratadine 10 MG tablet Commonly known as: CLARITIN Take 10 mg by mouth daily.   Magnesium Oxide -Mg Supplement 500 MG Tabs Take 1 tablet by mouth daily.   mesalamine 1000 MG suppository Commonly known as: CANASA Place 1 suppository (1,000 mg total) rectally at bedtime.   metFORMIN 500 MG tablet Commonly known as: GLUCOPHAGE Take 1,000 mg by mouth 2 (two) times daily with a meal.   Multi-Vitamin tablet Take 1 tablet by mouth daily.   ondansetron 4 MG tablet Commonly known as: ZOFRAN Take 4 mg by mouth every 12 (twelve) hours as needed for nausea or vomiting.   oxyCODONE-acetaminophen 10-325 MG tablet Commonly known as: PERCOCET Take 1 tablet by mouth every 6 (six) hours as needed for pain.   pantoprazole 40 MG tablet Commonly known as: PROTONIX Take 1 tablet  (40 mg total) by mouth 2 (two) times daily before a meal. What changed:  how much to take additional instructions   propranolol 20 MG tablet Commonly known as: INDERAL Take 20 mg by mouth 2 (two) times daily.   tranexamic acid 650 MG Tabs tablet Commonly known as: LYSTEDA Take 1,300 mg by mouth 3 (three) times daily.   traZODone 50 MG tablet Commonly known as: DESYREL Take 50-100 mg by mouth at bedtime as needed.   varenicline 0.5 MG X 11 & 1 MG X 42 tablet Commonly known as: CHANTIX PAK See admin instructions. follow package  directions          REVIEW OF SYSTEMS: A comprehensive ROS was conducted with the patient and is negative except as per HPI    OBJECTIVE:  VS: BP (!) 144/80 (BP Location: Left Arm, Patient Position: Sitting, Cuff Size: Large)   Pulse 76   Ht 5\' 6"  (1.676 m)   Wt 198 lb (89.8 kg)   SpO2 99%   BMI 31.96 kg/m    Wt Readings from Last 3 Encounters:  10/09/22 198 lb (89.8 kg)  04/29/22 188 lb (85.3 kg)  04/14/22 188 lb 15 oz (85.7 kg)     EXAM: General: Pt appears well and is in NAD  Eyes: External eye exam normal without stare, lid lag or exophthalmos.  EOM intact.    Neck: General: Supple without adenopathy. Thyroid: Thyroid size normal.  No goiter or nodules appreciated  Lungs: Clear with good BS bilat   Heart: Auscultation: RRR.  Abdomen:  soft, nontender  Extremities:  BL LE: No pretibial edema   Mental Status: Judgment, insight: Intact Orientation: Oriented to time, place, and person Mood and affect: No depression, anxiety, or agitation     DATA REVIEWED: 09/18/2022 Prolactin 27.4 ( 4.8-33.4)    03/12/2022 12 ng/mL    MRI 07/02/2021 Dedicated pituitary imaging reveals a partially empty sella turcica. No focal pituitary lesion is identified. The suprasellar cistern is patent. The pituitary stalk is midline and is not abnormally thickened.   Old records , labs and images have been reviewed.    ASSESSMENT/PLAN/RECOMMENDATIONS:   Pituitary Microadenoma   -No local symptoms -Her most recent MRI in 2023 showed no evidence of pituitary microadenoma but there was evidence of partial empty sella turcica -We will proceed with ACTH, cortisol, TFTs, IGF-I -I did explain to the patient that she needs to be of any glucocorticoids for at least 3 months before recheck   2. Prolactinoma :  -Patient has been on cabergoline since 2015, she endorses nausea and vomiting after taking it, patient interested in switching to bromocriptine, I did explain to the patient that bromocriptine is a daily medication -She has not been on antipsychotic before, on her initial diagnosis she was only on fluoxetine -We have opted to continue cabergoline but decrease the dose and see how she does with side effects -I will reduce cabergoline as below, she recently had labs through her PCPs office with normal prolactin level  -Will recheck labs in 3 months  Medication Decrease cabergoline 0.5 mg, half a tablet twice weekly   Signed electronically by: Lyndle Herrlich, MD  [facility]  [facility] [address] [place] [place], [place] [address] Phone: [phone] FAX: [phone]   CC: Enid Baas, MD [address] [place] [address] Phone: [phone] Fax: [phone]   Return to Endocrinology clinic as below: Future Appointments  Date Time Provider Department Center  10/09/2022  8:30 AM Edla Para, Konrad Dolores, MD [facility] None  11/03/2022 10:30 AM [facility] None  11/04/2022  2:15 PM Jeralyn Ruths, MD [facility] None  11/04/2022  2:30 PM [facility] INFUSION CHAIR 4 [facility] None

## 2022-10-10 ENCOUNTER — Other Ambulatory Visit: Payer: Self-pay | Admitting: Obstetrics and Gynecology

## 2022-10-30 ENCOUNTER — Other Ambulatory Visit: Payer: Self-pay | Admitting: Podiatry

## 2022-10-30 ENCOUNTER — Encounter: Payer: Self-pay | Admitting: Podiatry

## 2022-10-30 DIAGNOSIS — M7732 Calcaneal spur, left foot: Secondary | ICD-10-CM

## 2022-10-30 DIAGNOSIS — M898X9 Other specified disorders of bone, unspecified site: Secondary | ICD-10-CM

## 2022-10-30 DIAGNOSIS — G8929 Other chronic pain: Secondary | ICD-10-CM

## 2022-10-30 DIAGNOSIS — Z720 Tobacco use: Secondary | ICD-10-CM

## 2022-10-30 DIAGNOSIS — M19172 Post-traumatic osteoarthritis, left ankle and foot: Secondary | ICD-10-CM

## 2022-10-30 DIAGNOSIS — M216X9 Other acquired deformities of unspecified foot: Secondary | ICD-10-CM

## 2022-10-30 DIAGNOSIS — Z8739 Personal history of other diseases of the musculoskeletal system and connective tissue: Secondary | ICD-10-CM

## 2022-10-31 ENCOUNTER — Emergency Department
Admission: EM | Admit: 2022-10-31 | Discharge: 2022-10-31 | Disposition: A | Discharge status: Home / Self Care | Payer: No Typology Code available for payment source | Attending: Emergency Medicine | Admitting: Emergency Medicine

## 2022-10-31 ENCOUNTER — Emergency Department: Payer: No Typology Code available for payment source

## 2022-10-31 ENCOUNTER — Other Ambulatory Visit: Payer: Self-pay

## 2022-10-31 ENCOUNTER — Other Ambulatory Visit: Payer: No Typology Code available for payment source

## 2022-10-31 DIAGNOSIS — I10 Essential (primary) hypertension: Secondary | ICD-10-CM | POA: Insufficient documentation

## 2022-10-31 DIAGNOSIS — R202 Paresthesia of skin: Secondary | ICD-10-CM | POA: Diagnosis not present

## 2022-10-31 DIAGNOSIS — D509 Iron deficiency anemia, unspecified: Secondary | ICD-10-CM

## 2022-10-31 DIAGNOSIS — R079 Chest pain, unspecified: Secondary | ICD-10-CM | POA: Diagnosis present

## 2022-10-31 LAB — COMPREHENSIVE METABOLIC PANEL
ALT: 21 U/L (ref 0–44)
AST: 19 U/L (ref 15–41)
Albumin: 4.1 g/dL (ref 3.5–5.0)
Alkaline Phosphatase: 74 U/L (ref 38–126)
Anion gap: 13 (ref 5–15)
BUN: 11 mg/dL (ref 6–20)
CO2: 28 mmol/L (ref 22–32)
Calcium: 9 mg/dL (ref 8.9–10.3)
Chloride: 97 mmol/L — ABNORMAL LOW (ref 98–111)
Creatinine, Ser: 0.55 mg/dL (ref 0.44–1.00)
GFR, Estimated: >60 mL/min (ref >60)
Glucose, Bld: 112 mg/dL — ABNORMAL HIGH (ref 70–99)
Potassium: 3.7 mmol/L (ref 3.5–5.1)
Sodium: 138 mmol/L (ref 135–145)
Total Bilirubin: 0.3 mg/dL (ref 0.3–1.2)
Total Protein: 6.8 g/dL (ref 6.5–8.1)

## 2022-10-31 LAB — CBC WITH DIFFERENTIAL/PLATELET
Abs Immature Granulocytes: 0.04 10*3/uL (ref 0.00–0.07)
Basophils Absolute: 0 10*3/uL (ref 0.0–0.1)
Basophils Relative: 0 %
Eosinophils Absolute: 0.4 10*3/uL (ref 0.0–0.5)
Eosinophils Relative: 5 %
HCT: 40 % (ref 36.0–46.0)
Hemoglobin: 13.2 g/dL (ref 12.0–15.0)
Immature Granulocytes: 1 %
Lymphocytes Relative: 31 %
Lymphs Abs: 2.6 10*3/uL (ref 0.7–4.0)
MCH: 30.1 pg (ref 26.0–34.0)
MCHC: 33 g/dL (ref 30.0–36.0)
MCV: 91.1 fL (ref 80.0–100.0)
Monocytes Absolute: 0.8 10*3/uL (ref 0.1–1.0)
Monocytes Relative: 9 %
Neutro Abs: 4.7 10*3/uL (ref 1.7–7.7)
Neutrophils Relative %: 54 %
Platelets: 374 10*3/uL (ref 150–400)
RBC: 4.39 MIL/uL (ref 3.87–5.11)
RDW: 13.4 % (ref 11.5–15.5)
WBC: 8.6 10*3/uL (ref 4.0–10.5)
nRBC: 0 % (ref 0.0–0.2)

## 2022-10-31 LAB — TROPONIN I (HIGH SENSITIVITY)
Troponin I (High Sensitivity): 3 ng/L (ref <18)
Troponin I (High Sensitivity): 3 ng/L (ref <18)

## 2022-10-31 LAB — PREGNANCY, URINE: Preg Test, Ur: NEGATIVE

## 2022-10-31 MED ORDER — KETOROLAC TROMETHAMINE 15 MG/ML IJ SOLN
15.0000 mg | Freq: Once | INTRAMUSCULAR | Status: AC
  Administered 2022-10-31: 15 mg via INTRAVENOUS
  Filled 2022-10-31: qty 1

## 2022-10-31 MED ORDER — IOHEXOL 350 MG/ML SOLN
75.0000 mL | Freq: Once | INTRAVENOUS | Status: AC | PRN
  Administered 2022-10-31: 75 mL via INTRAVENOUS

## 2022-10-31 MED ORDER — ACETAMINOPHEN 500 MG PO TABS
1000.0000 mg | ORAL_TABLET | Freq: Once | ORAL | Status: AC
  Administered 2022-10-31: 1000 mg via ORAL
  Filled 2022-10-31: qty 2

## 2022-10-31 NOTE — ED Triage Notes (Signed)
Pt presents to the ED due to hypertension. Pt states her BP has been high since 2pm today. Pt is also c/o chest discomfort and "feeling weird". Pt A&Ox4

## 2022-10-31 NOTE — ED Notes (Signed)
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 BP was high earlier, 170/100. Then got HA while at hospital. Anterior, temporal 4-5/10.  Unlabored respirations. Trop drawn, IV placed.

## 2022-10-31 NOTE — Discharge Instructions (Addendum)
Your workup was reassuring without any evidence of heart attack or new blood clot or bleeding in the brain. She can monitor your blood pressure and follow-up with your primary care doctor to discuss changes in your blood pressure medicine and discuss further with your oncologist but at this time do not feel that we need to restart a blood thinner.  We discussed MRI but at this time you have opted to decline but you should return if you develop change in symptoms or worsening symptoms  IMPRESSION: 1. No evidence of acute intracranial abnormality. 2. Unchanged nonocclusive linear filling defect in the distal right transverse sinus, likely chronic thrombus or scar/web. Otherwise, dural venous sinuses are patent.

## 2022-10-31 NOTE — ED Provider Notes (Signed)
[facility] Provider Note    Event Date/Time   First MD Initiated Contact with Patient 10/31/22 1828     (approximate)   History   Hypertension   HPI  Breanna Ramos is a 52 y.o. female who comes in with concerns for elevated blood pressure since 2 PM and some chest discomfort.  Patient reports having a history of right sinus venous thrombus currently not on anticoagulation.  She reports some tingling in both her hands and feet that started today.  She noticed her blood pressure was higher than normal 170/100 and then developed a mild headache that was gradually increasing -she denies any severe or sudden onset headache and this headache has just started around 5pm.  She denies any chest pain at this time or shortness of breath.  She denies any abdominal pain.  She continues to have some tingling in her hands and feet.  She reports history of TIA, venous thrombus but denies any new vision changes.  Physical Exam   Triage Vital Signs: ED Triage Vitals  Enc Vitals Group     BP 10/31/22 1527 (!) 157/101     Pulse Rate 10/31/22 1527 85     Resp 10/31/22 1527 18     Temp 10/31/22 1527 98.7 F (37.1 C)     Temp Source 10/31/22 1527 Oral     SpO2 10/31/22 1527 96 %     Weight 10/31/22 1527 191 lb (86.6 kg)     Height 10/31/22 1527 5\' 5"  (1.651 m)     Head Circumference --      Peak Flow --      Pain Score 10/31/22 1530 6     Pain Loc --      Pain Edu? --      Excl. in GC? --     Most recent vital signs: Vitals:   10/31/22 1527  BP: (!) 157/101  Pulse: 85  Resp: 18  Temp: 98.7 F (37.1 C)  SpO2: 96%     General: Awake, no distress.  CV:  Good peripheral perfusion.  Resp:  Normal effort.  Clear lungs Abd:  No distention.  Soft nontender Other:  Cranial nerves II to XII are intact.  Extraocular movements are intact.  Pupils reactive bilaterally.  No vision changes.  She does report some sensation changes in her hands, feet but otherwise  reassuring.   ED Results / Procedures / Treatments   Labs (all labs ordered are listed, but only abnormal results are displayed) Labs Reviewed  COMPREHENSIVE METABOLIC PANEL - Abnormal; Notable for the following components:      Result Value   Chloride 97 (*)    Glucose, Bld 112 (*)    All other components within normal limits  CBC WITH DIFFERENTIAL/PLATELET  TROPONIN I (HIGH SENSITIVITY)  TROPONIN I (HIGH SENSITIVITY)     EKG  My interpretation of EKG:  Normal sinus rate of 80 without any ST elevation or T wave inversions, normal intervals  RADIOLOGY  I reviewed the x-ray personally interpreted no evidence of any pneumonia  PROCEDURES:  Critical Care performed: No  Procedures   MEDICATIONS ORDERED IN ED: Medications  ketorolac (TORADOL) 15 MG/ML injection 15 mg (has no administration in time range)  acetaminophen (TYLENOL) tablet 1,000 mg (has no administration in time range)  iohexol (OMNIPAQUE) 350 MG/ML injection 75 mL (75 mLs Intravenous Contrast Given 10/31/22 1909)     IMPRESSION / MDM / ASSESSMENT AND PLAN / ED COURSE  I  reviewed the triage vital signs and the nursing notes.   Patient's presentation is most consistent with acute presentation with potential threat to life or bodily function.   Patient comes in with concerns for some tingling in her hands and feet with a mild headache does not sound with a subarachnoid headache just started CT scan done within 6 hours without evidence of intercranial hemorrhage so unlikely to be subarachnoid.  Has had prior CTAs without evidence of any aneurysm.  Her CTV was ordered  CMP shows slightly low chloride but otherwise reassuring.  CBC negative troponin negative  8:37 PM evaluated patient.  She still has a little bit of tingling in her fingertips and her feet bilaterally.  Her cranial nerve exam is otherwise reassuring.  Reports normal speech.  Her CTV is similar to prior and she has a follow-up appointment with  oncology on Tuesday but at this time I do not feel it is any indication to restart her blood thinner given the tingling in her extremities would not be from this.  Her blood pressure is 151/88 which I reviewed some prior visits and she typically runs 1 40-1 50 so this seems around her baseline.  She denies any chest pain or shortness of breath.  We discussed the possibility of this being a stroke or edema or cancer and how an MRI could look further to evaluate for these but at this time she declines and would like to just monitor symptoms at home and return if things are worsening.  This is reasonable given her tingling has not on one side and is just distributed in her fingertips and a little bit in her bilateral feet.  This also could be like a complex migraine.  Patient given some Toradol and Tylenol to help with symptoms.  Unable to do Reglan or Compazine due to her medications that she is on for her pituitary adenoma.  After further discussion with patient she would like to be discharged home and has follow-up with her PCP and her hematologist early next week.  And will return to the ER if she develops worsening symptoms or any other concerns     FINAL CLINICAL IMPRESSION(S) / ED DIAGNOSES   Final diagnoses:  Hypertension, unspecified type  Tingling in extremities  Chest pain, unspecified type     Rx / DC Orders   ED Discharge Orders     None        Note:  This document was prepared using Dragon voice recognition software and may include unintentional dictation errors.   Concha Se, MD 10/31/22 2041

## 2022-11-03 ENCOUNTER — Inpatient Hospital Stay: Payer: No Typology Code available for payment source | Attending: Oncology

## 2022-11-03 DIAGNOSIS — D509 Iron deficiency anemia, unspecified: Secondary | ICD-10-CM | POA: Insufficient documentation

## 2022-11-03 DIAGNOSIS — Z86718 Personal history of other venous thrombosis and embolism: Secondary | ICD-10-CM | POA: Diagnosis not present

## 2022-11-03 DIAGNOSIS — Z7901 Long term (current) use of anticoagulants: Secondary | ICD-10-CM | POA: Insufficient documentation

## 2022-11-03 LAB — IRON AND TIBC
Iron: 48 ug/dL (ref 28–170)
Saturation Ratios: 12 % (ref 10.4–31.8)
TIBC: 399 ug/dL (ref 250–450)
UIBC: 351 ug/dL

## 2022-11-03 LAB — CBC WITH DIFFERENTIAL (CANCER CENTER ONLY)
Abs Immature Granulocytes: 0.04 10*3/uL (ref 0.00–0.07)
Basophils Absolute: 0 10*3/uL (ref 0.0–0.1)
Basophils Relative: 0 %
Eosinophils Absolute: 0.6 10*3/uL — ABNORMAL HIGH (ref 0.0–0.5)
Eosinophils Relative: 6 %
HCT: 40.2 % (ref 36.0–46.0)
Hemoglobin: 13.4 g/dL (ref 12.0–15.0)
Immature Granulocytes: 0 %
Lymphocytes Relative: 27 %
Lymphs Abs: 2.5 10*3/uL (ref 0.7–4.0)
MCH: 30 pg (ref 26.0–34.0)
MCHC: 33.3 g/dL (ref 30.0–36.0)
MCV: 90.1 fL (ref 80.0–100.0)
Monocytes Absolute: 0.9 10*3/uL (ref 0.1–1.0)
Monocytes Relative: 10 %
Neutro Abs: 5.1 10*3/uL (ref 1.7–7.7)
Neutrophils Relative %: 57 %
Platelet Count: 341 10*3/uL (ref 150–400)
RBC: 4.46 MIL/uL (ref 3.87–5.11)
RDW: 13.5 % (ref 11.5–15.5)
WBC Count: 9.1 10*3/uL (ref 4.0–10.5)
nRBC: 0 % (ref 0.0–0.2)

## 2022-11-03 LAB — FERRITIN: Ferritin: 9 ng/mL — ABNORMAL LOW (ref 11–307)

## 2022-11-04 ENCOUNTER — Inpatient Hospital Stay: Payer: No Typology Code available for payment source | Admitting: Oncology

## 2022-11-04 ENCOUNTER — Encounter: Payer: Self-pay | Admitting: Oncology

## 2022-11-04 ENCOUNTER — Inpatient Hospital Stay: Payer: No Typology Code available for payment source

## 2022-11-04 VITALS — BP 129/84 | HR 92 | Temp 97.6°F | Resp 19 | Wt 196.9 lb

## 2022-11-04 VITALS — BP 142/92 | HR 84 | Temp 98.1°F | Resp 18

## 2022-11-04 DIAGNOSIS — D509 Iron deficiency anemia, unspecified: Secondary | ICD-10-CM

## 2022-11-04 MED ORDER — SODIUM CHLORIDE 0.9 % IV SOLN
INTRAVENOUS | Status: DC
  Filled 2022-11-04 (×2): qty 250

## 2022-11-04 MED ORDER — SODIUM CHLORIDE 0.9 % IV SOLN
200.0000 mg | Freq: Once | INTRAVENOUS | Status: AC
  Administered 2022-11-04: 200 mg via INTRAVENOUS
  Filled 2022-11-04: qty 200

## 2022-11-04 NOTE — Patient Instructions (Signed)

## 2022-11-04 NOTE — Progress Notes (Signed)
[facility]  Telephone:([phone]) [phone] Fax:(336) [phone]  ID: Osborn Coho OB: 15-Sep-1970  MR#: [id number]  DGL#:[id number]  Patient Care Team: Enid Baas, MD as PCP - General (Internal Medicine) Jeralyn Ruths, MD as Consulting Physician (Hematology and Oncology)   CHIEF COMPLAINT: Iron deficiency anemia.  INTERVAL HISTORY: Patient returns to clinic today for repeat laboratory work, further evaluation, and consideration of additional IV Venofer.  She was recently seen in the emergency room with increased dizziness and some numbness in her fingertips, but CT of her head was unchanged.  She otherwise feels well.  She continues to have mild fatigue.  She has no other neurologic complaints.  She denies any recent fevers or illnesses.  She has a good appetite and denies weight loss.  She has no chest pain, shortness of breath, cough, or hemoptysis.  She denies any nausea, vomiting, constipation, or diarrhea.  She has no urinary complaints.  Patient offers no further specific complaints today.  REVIEW OF SYSTEMS:   Review of Systems  Constitutional:  Positive for malaise/fatigue. Negative for fever and weight loss.  Respiratory: Negative.  Negative for cough, hemoptysis and shortness of breath.   Cardiovascular: Negative.  Negative for chest pain and leg swelling.  Gastrointestinal: Negative.  Negative for abdominal pain.  Genitourinary: Negative.  Negative for dysuria.  Musculoskeletal: Negative.  Negative for back pain.  Skin: Negative.  Negative for rash.  Neurological:  Positive for sensory change. Negative for dizziness, tingling, speech change, focal weakness, weakness and headaches.  Psychiatric/Behavioral:  The patient is not nervous/anxious.     As per HPI. Otherwise, a complete review of systems is negative.  PAST MEDICAL HISTORY: Past Medical History:  Diagnosis Date   Acute focal neurologic deficit with complete resolution    Arthritis    Carpal  tunnel syndrome    Cerebral venous sinus thrombosis    Chronic, continuous use of opioids    Depression    Diabetes mellitus without complication (HCC)    Fibromyalgia    H/O cardiac radiofrequency ablation    History of rectal bleeding 08/2020   Hypertension    Microprolactinoma (HCC) 2016   Nicotine dependence    Normocytic anemia    OSA on CPAP     PAST SURGICAL HISTORY: Past Surgical History:  Procedure Laterality Date   BACK SURGERY     x 2   CARDIAC ELECTROPHYSIOLOGY STUDY AND ABLATION     CHOLECYSTECTOMY     COLONOSCOPY WITH PROPOFOL N/A 03/07/2020   Procedure: COLONOSCOPY WITH PROPOFOL;  Surgeon: Toney Reil, MD;  Location: [facility] ENDOSCOPY;  Service: Gastroenterology;  Laterality: N/A;   FRACTURE SURGERY     Left Ankle    FAMILY HISTORY: Family History  Problem Relation Age of Onset   Stroke Mother    Diabetes Mother    Heart disease Mother    Breast cancer Mother 24   Stroke Father     ADVANCED DIRECTIVES (Y/N):  N  HEALTH MAINTENANCE: Social History   Tobacco Use   Smoking status: Every Day    Packs/day: .25    Types: Cigarettes   Smokeless tobacco: Never  Substance Use Topics   Alcohol use: Yes    Comment: ~1 beer/week   Drug use: Never     Colonoscopy:  PAP:  Bone density:  Lipid panel:  Allergies  Allergen Reactions   Codeine Other (See Comments)    Cough syrup with codeine caused hyperactivity    Current Outpatient Medications  Medication Sig  Dispense Refill   ascorbic acid (VITAMIN C) 500 MG tablet Take 500 mg by mouth daily.      atorvastatin (LIPITOR) 80 MG tablet Take 1 tablet (80 mg total) by mouth daily. 30 tablet 3   cabergoline (DOSTINEX) 0.5 MG tablet Take 0.5 tablets (0.25 mg total) by mouth 2 (two) times a week. Tuesday/Saturday 13 tablet 2   celecoxib (CELEBREX) 200 MG capsule Take 200 mg by mouth at bedtime.      Cholecalciferol 50 MCG (2000 UT) CAPS Take 2,000 Units by mouth daily.      cyclobenzaprine  (FLEXERIL) 10 MG tablet Take 10 mg by mouth 3 (three) times daily as needed for muscle spasms.     diltiazem (TIAZAC) 240 MG 24 hr capsule Take 240 mg by mouth daily.      famciclovir (FAMVIR) 500 MG tablet Take 500 mg by mouth every 8 (eight) hours as needed.     FLUoxetine (PROZAC) 40 MG capsule Take 40 mg by mouth daily.     gabapentin (NEURONTIN) 300 MG capsule Take 300 mg by mouth daily.      lamoTRIgine (LAMICTAL) 200 MG tablet Take 200 mg by mouth daily.     lamoTRIgine (LAMICTAL) 25 MG tablet Take 50 mg by mouth at bedtime.     loratadine (CLARITIN) 10 MG tablet Take 10 mg by mouth daily.      Magnesium Oxide 500 MG TABS Take 1 tablet by mouth daily.     mesalamine (CANASA) 1000 MG suppository Place 1 suppository (1,000 mg total) rectally at bedtime. 30 suppository 1   metFORMIN (GLUCOPHAGE) 500 MG tablet Take 1,000 mg by mouth 2 (two) times daily with a meal.     Multiple Vitamin (MULTI-VITAMIN) tablet Take 1 tablet by mouth daily.     ondansetron (ZOFRAN) 4 MG tablet Take 4 mg by mouth every 12 (twelve) hours as needed for nausea or vomiting.     ondansetron (ZOFRAN-ODT) 8 MG disintegrating tablet Take by mouth.     oxyCODONE-acetaminophen (PERCOCET) 10-325 MG tablet Take 1 tablet by mouth every 6 (six) hours as needed for pain.      pantoprazole (PROTONIX) 40 MG tablet Take 1 tablet (40 mg total) by mouth 2 (two) times daily before a meal. (Patient taking differently: Take by mouth 2 (two) times daily before a meal. 30 in morning 30 at night) 180 tablet 3   propranolol (INDERAL) 20 MG tablet Take 20 mg by mouth 2 (two) times daily.     tranexamic acid (LYSTEDA) 650 MG TABS tablet Take 1,300 mg by mouth 3 (three) times daily.     traZODone (DESYREL) 50 MG tablet Take 50-100 mg by mouth at bedtime as needed.     varenicline (CHANTIX PAK) 0.5 MG X 11 & 1 MG X 42 tablet See admin instructions. follow package directions     apixaban (ELIQUIS) 5 MG TABS tablet Take 2 tablets (10 mg total) by  mouth 2 (two) times daily. From 07/10/2021 take 5 mg (1 tab) twice a day (Patient not taking: Reported on 11/04/2022) 60 tablet 3   No current facility-administered medications for this visit.   Facility-Administered Medications Ordered in Other Visits  Medication Dose Route Frequency Provider Last Rate Last Admin   0.9 %  sodium chloride infusion   Intravenous Continuous Jeralyn Ruths, MD   Stopped at 11/04/22 1536    OBJECTIVE: Vitals:   11/04/22 1410  BP: 129/84  Pulse: 92  Resp: 19  Temp: 97.6 F (  36.4 C)  SpO2: 96%      Body mass index is 32.77 kg/m.    ECOG FS:0 - Asymptomatic  General: Well-developed, well-nourished, no acute distress. Eyes: Pink conjunctiva, anicteric sclera. HEENT: Normocephalic, moist mucous membranes. Lungs: No audible wheezing or coughing. Heart: Regular rate and rhythm. Abdomen: Soft, nontender, no obvious distention. Musculoskeletal: No edema, cyanosis, or clubbing. Neuro: Alert, answering all questions appropriately. Cranial nerves grossly intact. Skin: No rashes or petechiae noted. Psych: Normal affect.  LAB RESULTS:  Lab Results  Component Value Date   NA 138 10/31/2022   K 3.7 10/31/2022   CL 97 (L) 10/31/2022   CO2 28 10/31/2022   GLUCOSE 112 (H) 10/31/2022   BUN 11 10/31/2022   CREATININE 0.55 10/31/2022   CALCIUM 9.0 10/31/2022   PROT 6.8 10/31/2022   ALBUMIN 4.1 10/31/2022   AST 19 10/31/2022   ALT 21 10/31/2022   ALKPHOS 74 10/31/2022   BILITOT 0.3 10/31/2022   GFRNONAA >60 10/31/2022   GFRAA >60 02/15/2020    Lab Results  Component Value Date   WBC 9.1 11/03/2022   NEUTROABS 5.1 11/03/2022   HGB 13.4 11/03/2022   HCT 40.2 11/03/2022   MCV 90.1 11/03/2022   PLT 341 11/03/2022   Lab Results  Component Value Date   IRON 48 11/03/2022   TIBC 399 11/03/2022   IRONPCTSAT 12 11/03/2022   Lab Results  Component Value Date   FERRITIN 9 (L) 11/03/2022     STUDIES: CT VENOGRAM HEAD  Result Date:  10/31/2022 CLINICAL DATA:  Dural venous sinus thrombosis suspected EXAM: CT VENOGRAM HEAD TECHNIQUE: Venographic phase images of the brain were obtained following the administration of intravenous contrast. Multiplanar reformats and maximum intensity projections were generated. RADIATION DOSE REDUCTION: This exam was performed according to the departmental dose-optimization program which includes automated exposure control, adjustment of the mA and/or kV according to patient size and/or use of iterative reconstruction technique. CONTRAST:  75mL OMNIPAQUE IOHEXOL 350 MG/ML SOLN COMPARISON:  CT venogram May 14, 2022. FINDINGS: Unchanged nonocclusive linear filling defect in the distal right transverse sinus, likely chronic thrombus or scar/web. Otherwise, dural venous sinuses are patent. Small left transverse/sigmoid sinuses. IMPRESSION: 1. No evidence of acute intracranial abnormality. 2. Unchanged nonocclusive linear filling defect in the distal right transverse sinus, likely chronic thrombus or scar/web. Otherwise, dural venous sinuses are patent. Electronically Signed   By: Feliberto Harts M.D.   On: 10/31/2022 19:36   DG Chest 2 View  Result Date: 10/31/2022 CLINICAL DATA:  Chest pain. EXAM: CHEST - 2 VIEW COMPARISON:  None Available. FINDINGS: The heart size and mediastinal contours are within normal limits. Both lungs are clear. No visible pleural effusions or pneumothorax. The visualized skeletal structures are unremarkable. Partially imaged cervical fusion hardware. IMPRESSION: No active cardiopulmonary disease. Electronically Signed   By: Feliberto Harts M.D.   On: 10/31/2022 19:24    ASSESSMENT: Iron deficiency anemia.  PLAN:    Iron deficiency anemia: Patient has a mildly decreased ferritin, but the remainder of her iron panel and hemoglobin are within normal limits.  She is mildly fatigued, therefore we will proceed with 1 additional dose of 200 mg IV Venofer today.  She does not require  additional treatment.  Patient reports she is having a hysterectomy in July.  Return to clinic in 4 months with repeat laboratory work, further evaluation, and consideration of additional IV iron. Cerebral sinus thrombosis: Recent CT of the head was reported as unchanged.  Previously, CT venogram  of the head on July 02, 2021 reviewed independently with a 2.5 cm filling defect in the right transverse sinus consistent with thrombus, although unclear if this was acute or chronic.  Full hypercoagulable workup is negative.  Recommendations for an unprovoked sinus clot is 12 months of treatment and then discontinue.  Patient expressed understanding that while recurrence is unlikely she has approximately 2 to 7% chance of this happening.  No further intervention is needed.  Patient had DVT or other blood clot would recommend lifelong treatment at that time.    I spent a total of 30 minutes reviewing chart data, face-to-face evaluation with the patient, counseling and coordination of care as detailed above.    Patient expressed understanding and was in agreement with this plan. She also understands that She can call clinic at any time with any questions, concerns, or complaints.    Jeralyn Ruths, MD   11/04/2022 3:54 PM

## 2022-11-05 ENCOUNTER — Ambulatory Visit
Admission: RE | Admit: 2022-11-05 | Discharge: 2022-11-05 | Disposition: A | Discharge status: Home / Self Care | Payer: No Typology Code available for payment source | Source: Ambulatory Visit | Attending: Podiatry | Admitting: Podiatry

## 2022-11-05 DIAGNOSIS — Z720 Tobacco use: Secondary | ICD-10-CM

## 2022-11-05 DIAGNOSIS — G8929 Other chronic pain: Secondary | ICD-10-CM

## 2022-11-05 DIAGNOSIS — M7732 Calcaneal spur, left foot: Secondary | ICD-10-CM

## 2022-11-05 DIAGNOSIS — Z8739 Personal history of other diseases of the musculoskeletal system and connective tissue: Secondary | ICD-10-CM

## 2022-11-05 DIAGNOSIS — M19172 Post-traumatic osteoarthritis, left ankle and foot: Secondary | ICD-10-CM

## 2022-11-05 DIAGNOSIS — M216X9 Other acquired deformities of unspecified foot: Secondary | ICD-10-CM

## 2022-11-05 DIAGNOSIS — M898X9 Other specified disorders of bone, unspecified site: Secondary | ICD-10-CM

## 2022-11-05 DIAGNOSIS — M545 Low back pain, unspecified: Secondary | ICD-10-CM

## 2022-11-06 NOTE — H&P (Signed)
Preoperative History and Physical  Chief Complaint: Breanna Ramos is a 52 y.o. G1P1001 here for surgical management of heavy menstrual bleeding and presumed adenomyosis.   No significant preoperative concerns.  History of Present Illness: 52 y.o. G78P1001 female who presents with heavy menstrual bleeding and is thought to have adenomyosis.  She has tried TXA and this doesn't work well.  The pain with her periods is getting way worse than it was before. She would like to go ahead and have a hysterectomy. She states that she has a small cyst on the left ovary. She has been having abnormal periods for more than a year.  Her periods no longer come monthly. Now they seem to be every 3 weeks or so and last for 10-14 days.  Her last period was 14 days. She has resultant blood loss anemia which led her to receive iron infusions.  She is unsure whether they helped in terms of symptoms.     She stopped blood thinners in February this year. She had been recommended to take the blood thinners for one year. Even after stopping her blood thinners, she believes these made things worse.  She had a partial bowel blockage in 2021 and she had a full body scan and cysts were found.    She has been counseled previously about treatment options. She considered an IUD, but is tired of bleeding so much.    She see Christian Mate, MD, at the [facility] who has managed her anemia and her thrombosis.  She has been tested for a blood disorder, which was negative. She had a TIA in 06/2021.     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     Last pap smear: 03/2021: ASCUS, HPV negative  Endometrial biopsy 10/07/2022: Secretory endometrium, no atypia or malignancy   Pelvic ultrasound (report from 02/2022): FINDINGS:   Uterus: Size: 8.3 x 4.5 x 5.2 cm. Echogenicity: Heterogeneous. Endometrium: Normal. Endometrial thickness: 1.1 cm.  Masses: No masses.    Ovaries: Left ovary is not visualized on  the images provided and reviewed. Right: Ovary measures 2.7 x 1.7 x 1.7 cm. Color and spectral Doppler flow is present.   Adnexa: Right: No adnexal mass identified. Left: No adnexal mass identified.   Fluid: None.   IMPRESSION: Nonspecific heterogeneous appearance of the uterine echotexture, as may be seen in the setting of adenomyosis.    Proposed surgery: Robot assisted total laparoscopic hysterectomy, bilateral salpingectomy, cystoscopy   Past Medical History:  Diagnosis Date   Abnormal Pap smear of vagina    Allergy 2000   Anemia June or july 2023   Anxiety    Arthritis 2013   Depression    Diabetes mellitus without complication (CMS/HHS-HCC)    Endometriosis of uterus Nov 2023   It is actually andometrosis   Fibromyalgia    History of abnormal cervical Pap smear 2000   History of stroke Feb 2023   Hypertension 2000   Liver disease 2023   Fatty liver   Pituitary tumor    Sleep apnea March 2023   SVT (supraventricular tachycardia)    TIA (transient ischemic attack) 07/02/2021   Past Surgical History:  Procedure Laterality Date   APPLICATION EXTERNAL FIXATION LEG     CERVICAL BIOPSY  W/ LOOP ELECTRODE EXCISION  2000, 2005, 2008   CESAREAN SECTION  03/22/1992   CHOLECYSTECTOMY     FRACTURE SURGERY     back surgery x2   OPERATIVE TISSUE ABLATION  AND RECONSTRUCTION ATRIA PERFORMED WITH OTHER CARDIAC PROCEDURE     SPINE SURGERY  2013 neck   2019 lower back   OB History  Gravida Para Term Preterm AB Living  1 1 1     1   SAB IAB Ectopic Molar Multiple Live Births            1    # Outcome Date GA Lbr Len/2nd Weight Sex Type Anes PTL Lv  1 Term 02/1992    F CS-LTranv   LIV  Patient denies any other pertinent gynecologic issues.   Current Outpatient Medications on File Prior to Visit  Medication Sig Dispense Refill   ascorbic acid, vitamin C, (VITAMIN C) 500 MG tablet Take 1 tablet (500 mg total) by mouth 2 (two) times daily     atorvastatin (LIPITOR) 80 MG  tablet TAKE 0.5 TABLETS (40 MG TOTAL) BY MOUTH ONCE DAILY 45 tablet 1   cabergoline (DOSTINEX) 0.5 mg tablet Take 1 tablet (0.5 mg total) by mouth twice a week On Tuesday and Saturday- patient needs 24 tablets for her 3 month supply. Please dispense 24 tablets 24 tablet 0   celecoxib (CELEBREX) 200 MG capsule Take 1 capsule (200 mg total) by mouth once daily     cyclobenzaprine (FLEXERIL) 10 MG tablet Take 10 mg by mouth 3 (three) times daily as needed for Muscle spasms     dilTIAZem (CARDIZEM CD) 240 MG CD capsule TAKE 1 CAPSULE BY MOUTH ONCE DAILY. 90 capsule 1   esomeprazole (NEXIUM) 40 MG DR capsule Take 1 capsule (40 mg total) by mouth 2 (two) times daily (Patient taking differently: Take 20 mg by mouth once daily) 60 capsule 3   famciclovir (FAMVIR) 500 MG tablet TAKE 1 TABLET BY MOUTH EVERY 8 HOURS AS NEEDED. 20 tablet 0   FLUoxetine (PROZAC) 20 MG capsule Take 3 capsules (60 mg total) by mouth once daily     gabapentin (NEURONTIN) 300 MG capsule Take 1 capsule (300 mg total) by mouth once daily     lamoTRIgine (LAMICTAL) 200 MG tablet Take 200 mg by mouth every morning     lamoTRIgine (LAMICTAL) 25 MG tablet Take 50 mg by mouth at bedtime     loratadine (CLARITIN) 10 mg tablet Take 1 tablet (10 mg total) by mouth once daily     magnesium oxide 500 mg Tab Take 1 tablet by mouth once daily        metFORMIN (GLUCOPHAGE) 500 MG tablet TAKE 2 TABLETS IN THE MORNING AND 1 TABLET IN THE EVENING 270 tablet 1   multivitamin tablet Take 1 tablet by mouth once daily     ondansetron (ZOFRAN) 8 MG tablet Take 1 tablet (8 mg total) by mouth every 12 (twelve) hours as needed for Nausea 30 tablet 1   ondansetron (ZOFRAN-ODT) 8 MG disintegrating tablet Take 1 tablet (8 mg total) by mouth every 8 (eight) hours as needed for Nausea for up to 7 days 20 tablet 0   oxyCODONE-acetaminophen (PERCOCET) 10-325 mg tablet Take 1 tablet by mouth every 6 (six) hours 4 tablets daily     pantoprazole (PROTONIX) 40 MG DR  tablet TAKE 1 TABLET BY MOUTH 2 TIMES DAILY BEFORE MEALS. 180 tablet 1   propranoloL (INDERAL) 10 MG tablet Take 1 tablet (10 mg total) by mouth 2 (two) times daily Take this along with the 20 mg of propranolol twice daily to make a total of 30 mg twice daily 180 tablet 0   propranoloL (INDERAL)  20 MG tablet Take 1 tablet (20 mg total) by mouth 2 (two) times daily Take this along with the 10 mg of propranolol twice daily to make a total of 30 mg twice daily 180 tablet 1   traZODone (DESYREL) 100 MG tablet Take 100 mg by mouth at bedtime     tranexamic acid (LYSTEDA) 650 mg tablet Take 2 tablets (1,300 mg total) by mouth 3 (three) times daily Take for a maximum of 5 days during monthly menstruation. (Patient not taking: Reported on 11/06/2022) 30 tablet 5   No current facility-administered medications on file prior to visit.   Allergies  Allergen Reactions   Codeine Other (See Comments)    Hyperactive/hyper.    Social History:   reports that she has been smoking cigarettes. She has a 1.3 pack-year smoking history. She has been exposed to tobacco smoke. She has never used smokeless tobacco. She reports current alcohol use of about 1.0 standard drink of alcohol per week. She reports that she does not use drugs.  Family History  Problem Relation Name Age of Onset   Stroke Mother Aram Beecham    Diabetes Mother Aram Beecham    Breast cancer Mother Aram Beecham    Heart disease Mother Aram Beecham    Alzheimer's disease Mother Aram Beecham    High blood pressure (Hypertension) Mother Aram Beecham    Stroke Father Mathis Fare    Alcohol abuse Father Mathis Fare    Depression Brother Dyess    Depression Brother Buster    Alzheimer's disease Maternal Grandmother Juliet     Review of Systems: Noncontributory  PHYSICAL EXAM: Blood pressure (!) 142/92, pulse 99, weight 88.9 kg (196 lb), last menstrual period 10/03/2022. CONSTITUTIONAL: Well-developed, well-nourished female in no acute distress.  HENT:  Normocephalic, atraumatic,  External right and left ear normal. Oropharynx is clear and moist EYES: Conjunctivae and EOM are normal. Pupils are equal, round, and reactive to light. No scleral icterus.  NECK: Normal range of motion, supple, no masses SKIN: Skin is warm and dry. No rash noted. Not diaphoretic. No erythema. No pallor. NEUROLGIC: Alert and oriented to person, place, and time. Normal reflexes, muscle tone coordination. No cranial nerve deficit noted. PSYCHIATRIC: Normal mood and affect. Normal behavior. Normal judgment and thought content. CARDIOVASCULAR: Normal heart rate noted, regular rhythm RESPIRATORY: Effort and breath sounds normal, no problems with respiration noted ABDOMEN: Soft, nontender, nondistended. PELVIC: Deferred MUSCULOSKELETAL: Normal range of motion. No edema and no tenderness. 2+ distal pulses.  Labs: No results found for this or any previous visit (from the past 336 hour(s)).  Imaging Studies: No results found.  Assessment: 1. Menorrhagia with irregular cycle   2. Adenomyosis      Plan: Patient will undergo surgical management with the above-noted surgery.   The risks of surgery were discussed in detail with the patient including but not limited to: bleeding which may require transfusion or reoperation; infection which may require antibiotics; injury to surrounding organs which may involve bowel, bladder, ureters ; need for additional procedures including laparoscopy or laparotomy; thromboembolic phenomenon, surgical site problems and other postoperative/anesthesia complications. Likelihood of success in alleviating the patient's condition was discussed. Routine postoperative instructions will be reviewed with the patient and her family in detail after surgery.  The patient concurred with the proposed plan, giving informed written consent for the surgery.   Preoperative prophylactic antibiotics, as indicated, and SCDs ordered on call to the OR.     Attestation Statement:   I  personally performed the service. (TP)  Tequisha Maahs DANIEL  Jean Rosenthal, MD  [facility] 11/06/2022 3:40 PM

## 2022-11-17 ENCOUNTER — Other Ambulatory Visit: Payer: Self-pay

## 2022-11-17 ENCOUNTER — Encounter
Admission: RE | Admit: 2022-11-17 | Discharge: 2022-11-17 | Disposition: A | Discharge status: Home / Self Care | Payer: No Typology Code available for payment source | Source: Ambulatory Visit | Attending: Obstetrics and Gynecology | Admitting: Obstetrics and Gynecology

## 2022-11-17 VITALS — Ht 66.5 in | Wt 195.0 lb

## 2022-11-17 DIAGNOSIS — Z01818 Encounter for other preprocedural examination: Secondary | ICD-10-CM

## 2022-11-17 DIAGNOSIS — E119 Type 2 diabetes mellitus without complications: Secondary | ICD-10-CM

## 2022-11-17 HISTORY — DX: Hemorrhage of anus and rectum: K62.5

## 2022-11-17 HISTORY — DX: Other long term (current) drug therapy: Z79.899

## 2022-11-17 HISTORY — DX: Iron deficiency anemia, unspecified: D50.9

## 2022-11-17 HISTORY — DX: Anxiety disorder, unspecified: F41.9

## 2022-11-17 HISTORY — DX: Neoplasm of unspecified behavior of endocrine glands and other parts of nervous system: D49.7

## 2022-11-17 HISTORY — DX: Supraventricular tachycardia, unspecified: I47.10

## 2022-11-17 HISTORY — DX: Unspecified intestinal obstruction, unspecified as to partial versus complete obstruction: K56.609

## 2022-11-17 HISTORY — DX: Gastro-esophageal reflux disease without esophagitis: K21.9

## 2022-11-17 NOTE — Patient Instructions (Signed)
Your procedure is scheduled on: Wednesday 12/03/22 To find out your arrival time, please call [phone] between 1PM - 3PM on:   Tuesday 12/02/22 Report to the Registration Desk on the 1st floor of the [facility]. Free Valet parking is available.  If your arrival time is 6:00 am, do not arrive before that time as the [facility] entrance doors do not open until 6:00 am.  REMEMBER: Instructions that are not followed completely may result in serious medical risk, up to and including death; or upon the discretion of your surgeon and anesthesiologist your surgery may need to be rescheduled.  Do not eat food or drink any liquids after midnight the night before surgery.  No gum chewing or hard candies.  One week prior to surgery: Stop Anti-inflammatories (NSAIDS) such as Advil, Aleve, Ibuprofen, Motrin, Naproxen, Naprosyn and Aspirin based products such as Excedrin, Goody's Powder, BC Powder. You may however, continue to take Tylenol if needed for pain up until the day of surgery.  Stop ANY OVER THE COUNTER supplements for 1 week until after surgery.  Continue taking all prescribed medications with the exception of the following: Metformin, hold for 2 days. Last dose will be Sunday night 11/30/22  TAKE ONLY THESE MEDICATIONS THE MORNING OF SURGERY WITH A SIP OF WATER:  diltiazem (TIAZAC) 240 MG 24 hr capsule  esomeprazole (NEXIUM) 40 MG capsule Antacid (take one the night before and one on the morning of surgery - helps to prevent nausea after surgery.) FLUoxetine (PROZAC) 60 MG  lamoTRIgine (LAMICTAL) 200 MG tablet  loratadine (CLARITIN) 10 MG tablet  propranolol (INDERAL) 30 MG  oxyCODONE-acetaminophen (PERCOCET) 10-325 MG tablet if needed ondansetron (ZOFRAN) 8 MG tablet if needed  No Alcohol for 24 hours before or after surgery.  No Smoking including e-cigarettes for 24 hours before surgery.  No chewable tobacco products for at least 6 hours before surgery.  No nicotine patches on  the day of surgery.  Do not use any "recreational" drugs for at least a week (preferably 2 weeks) before your surgery.  Please be advised that the combination of cocaine and anesthesia may have negative outcomes, up to and including death. If you test positive for cocaine, your surgery will be cancelled.  On the morning of surgery brush your teeth with toothpaste and water, you may rinse your mouth with mouthwash if you wish. Do not swallow any toothpaste or mouthwash.  Use CHG Soap or wipes as directed on instruction sheet.  Do not wear lotions, powders, or perfumes.   Do not shave body hair from the neck down 48 hours before surgery.  Wear comfortable clothing (specific to your surgery type) to the hospital.  Do not wear jewelry, make-up, hairpins, clips or nail polish.  Contact lenses, hearing aids and dentures may not be worn into surgery. Bring a case for your glasses  Do not bring valuables to the hospital. [facility] is not responsible for any missing/lost belongings or valuables.   Notify your doctor if there is any change in your medical condition (cold, fever, infection).  If you are being discharged the day of surgery, you will not be allowed to drive home. You will need a responsible individual to drive you home and stay with you for 24 hours after surgery.   If you are taking public transportation, you will need to have a responsible individual with you.  If you are being admitted to the hospital overnight, leave your suitcase in the car. After surgery it  may be brought to your room.  In case of increased patient census, it may be necessary for you, the patient, to continue your postoperative care in the Same Day Surgery department.  After surgery, you can help prevent lung complications by doing breathing exercises.  Take deep breaths and cough every 1-2 hours. Your doctor may order a device called an Incentive Spirometer to help you take deep breaths. When coughing  or sneezing, hold a pillow firmly against your incision with both hands. This is called "splinting." Doing this helps protect your incision. It also decreases belly discomfort.  Surgery Visitation Policy:  Patients undergoing a surgery or procedure may have two family members or support persons with them as long as the person is not COVID-19 positive or experiencing its symptoms.   Inpatient Visitation:    Visiting hours are 7 a.m. to 8 p.m. Up to four visitors are allowed at one time in a patient room. The visitors may rotate out with other people during the day. One designated support person (adult) may remain overnight.  Please call the Pre-admissions Testing Dept. at [phone] if you have any questions about these instructions.     Preparing for Surgery with CHLORHEXIDINE GLUCONATE (CHG) Soap  Chlorhexidine Gluconate (CHG) Soap  o An antiseptic cleaner that kills germs and bonds with the skin to continue killing germs even after washing  o Used for showering the night before surgery and morning of surgery  Before surgery, you can play an important role by reducing the number of germs on your skin.  CHG (Chlorhexidine gluconate) soap is an antiseptic cleanser which kills germs and bonds with the skin to continue killing germs even after washing.  Please do not use if you have an allergy to CHG or antibacterial soaps. If your skin becomes reddened/irritated stop using the CHG.  1. Shower the NIGHT BEFORE SURGERY and the MORNING OF SURGERY with CHG soap.  2. If you choose to wash your hair, wash your hair first as usual with your normal shampoo.  3. After shampooing, rinse your hair and body thoroughly to remove the shampoo.  4. Use CHG as you would any other liquid soap. You can apply CHG directly to the skin and wash gently with a scrungie or a clean washcloth.  5. Apply the CHG soap to your body only from the neck down. Do not use on open wounds or open sores. Avoid  contact with your eyes, ears, mouth, and genitals (private parts). Wash face and genitals (private parts) with your normal soap.  6. Wash thoroughly, paying special attention to the area where your surgery will be performed.  7. Thoroughly rinse your body with warm water.  8. Do not shower/wash with your normal soap after using and rinsing off the CHG soap.  9. Pat yourself dry with a clean towel.  10. Wear clean pajamas to bed the night before surgery.  12. Place clean sheets on your bed the night of your first shower and do not sleep with pets.  13. Shower again with the CHG soap on the day of surgery prior to arriving at the hospital.  14. Do not apply any deodorants/lotions/powders.  15. Please wear clean clothes to the hospital.

## 2022-11-20 ENCOUNTER — Encounter: Payer: Self-pay | Admitting: Internal Medicine

## 2022-11-23 ENCOUNTER — Emergency Department
Admission: EM | Admit: 2022-11-23 | Discharge: 2022-11-23 | Disposition: A | Discharge status: Home / Self Care | Payer: No Typology Code available for payment source | Attending: Emergency Medicine | Admitting: Emergency Medicine

## 2022-11-23 ENCOUNTER — Emergency Department: Payer: No Typology Code available for payment source

## 2022-11-23 ENCOUNTER — Other Ambulatory Visit: Payer: Self-pay

## 2022-11-23 DIAGNOSIS — E119 Type 2 diabetes mellitus without complications: Secondary | ICD-10-CM | POA: Insufficient documentation

## 2022-11-23 DIAGNOSIS — I10 Essential (primary) hypertension: Secondary | ICD-10-CM | POA: Diagnosis not present

## 2022-11-23 DIAGNOSIS — K5903 Drug induced constipation: Secondary | ICD-10-CM | POA: Diagnosis not present

## 2022-11-23 DIAGNOSIS — N201 Calculus of ureter: Secondary | ICD-10-CM | POA: Diagnosis not present

## 2022-11-23 DIAGNOSIS — N200 Calculus of kidney: Secondary | ICD-10-CM

## 2022-11-23 DIAGNOSIS — R1013 Epigastric pain: Secondary | ICD-10-CM | POA: Diagnosis present

## 2022-11-23 DIAGNOSIS — Z8673 Personal history of transient ischemic attack (TIA), and cerebral infarction without residual deficits: Secondary | ICD-10-CM | POA: Diagnosis not present

## 2022-11-23 HISTORY — DX: Calculus of kidney: N20.0

## 2022-11-23 LAB — COMPREHENSIVE METABOLIC PANEL
ALT: 49 U/L — ABNORMAL HIGH (ref 0–44)
AST: 37 U/L (ref 15–41)
Albumin: 4 g/dL (ref 3.5–5.0)
Alkaline Phosphatase: 94 U/L (ref 38–126)
Anion gap: 9 (ref 5–15)
BUN: 18 mg/dL (ref 6–20)
CO2: 26 mmol/L (ref 22–32)
Calcium: 8.8 mg/dL — ABNORMAL LOW (ref 8.9–10.3)
Chloride: 102 mmol/L (ref 98–111)
Creatinine, Ser: 0.55 mg/dL (ref 0.44–1.00)
GFR, Estimated: >60 mL/min (ref >60)
Glucose, Bld: 93 mg/dL (ref 70–99)
Potassium: 4.1 mmol/L (ref 3.5–5.1)
Sodium: 137 mmol/L (ref 135–145)
Total Bilirubin: 0.5 mg/dL (ref 0.3–1.2)
Total Protein: 6.7 g/dL (ref 6.5–8.1)

## 2022-11-23 LAB — CBC WITH DIFFERENTIAL/PLATELET
Abs Immature Granulocytes: 0.01 10*3/uL (ref 0.00–0.07)
Basophils Absolute: 0.1 10*3/uL (ref 0.0–0.1)
Basophils Relative: 1 %
Eosinophils Absolute: 0.6 10*3/uL — ABNORMAL HIGH (ref 0.0–0.5)
Eosinophils Relative: 7 %
HCT: 40.3 % (ref 36.0–46.0)
Hemoglobin: 13.3 g/dL (ref 12.0–15.0)
Immature Granulocytes: 0 %
Lymphocytes Relative: 29 %
Lymphs Abs: 2.3 10*3/uL (ref 0.7–4.0)
MCH: 30.4 pg (ref 26.0–34.0)
MCHC: 33 g/dL (ref 30.0–36.0)
MCV: 92.2 fL (ref 80.0–100.0)
Monocytes Absolute: 0.8 10*3/uL (ref 0.1–1.0)
Monocytes Relative: 10 %
Neutro Abs: 4.3 10*3/uL (ref 1.7–7.7)
Neutrophils Relative %: 53 %
Platelets: 363 10*3/uL (ref 150–400)
RBC: 4.37 MIL/uL (ref 3.87–5.11)
RDW: 13.2 % (ref 11.5–15.5)
WBC: 8 10*3/uL (ref 4.0–10.5)
nRBC: 0 % (ref 0.0–0.2)

## 2022-11-23 LAB — URINALYSIS, ROUTINE W REFLEX MICROSCOPIC
Bilirubin Urine: NEGATIVE
Glucose, UA: NEGATIVE mg/dL
Hgb urine dipstick: NEGATIVE
Ketones, ur: NEGATIVE mg/dL
Nitrite: NEGATIVE
Protein, ur: NEGATIVE mg/dL
Specific Gravity, Urine: 1.018 (ref 1.005–1.030)
pH: 5 (ref 5.0–8.0)

## 2022-11-23 LAB — LIPASE, BLOOD: Lipase: 25 U/L (ref 11–51)

## 2022-11-23 LAB — POC URINE PREG, ED: Preg Test, Ur: NEGATIVE

## 2022-11-23 LAB — TROPONIN I (HIGH SENSITIVITY)
Troponin I (High Sensitivity): 2 ng/L (ref <18)
Troponin I (High Sensitivity): 3 ng/L (ref <18)

## 2022-11-23 MED ORDER — IOHEXOL 9 MG/ML PO SOLN
500.0000 mL | ORAL | Status: DC
  Filled 2022-11-23 (×2): qty 500

## 2022-11-23 MED ORDER — TAMSULOSIN HCL 0.4 MG PO CAPS: 0.4000 mg | ORAL_CAPSULE | Freq: Every day | ORAL | 0 refills | Status: AC

## 2022-11-23 MED ORDER — OXYCODONE HCL 5 MG PO TABS
10.0000 mg | ORAL_TABLET | Freq: Once | ORAL | Status: AC
  Administered 2022-11-23: 10 mg via ORAL
  Filled 2022-11-23: qty 2

## 2022-11-23 MED ORDER — IOHEXOL 300 MG/ML  SOLN
100.0000 mL | Freq: Once | INTRAMUSCULAR | Status: AC | PRN
  Administered 2022-11-23: 100 mL via INTRAVENOUS

## 2022-11-23 MED ORDER — SODIUM CHLORIDE 0.9 % IV BOLUS
1000.0000 mL | Freq: Once | INTRAVENOUS | Status: AC
  Administered 2022-11-23: 1000 mL via INTRAVENOUS

## 2022-11-23 MED ORDER — LACTULOSE 10 GM/15ML PO SOLN: 30.0000 g | Freq: Every day | ORAL | 0 refills | Status: AC | PRN

## 2022-11-23 MED ORDER — LACTULOSE 10 GM/15ML PO SOLN
30.0000 g | Freq: Once | ORAL | Status: AC
  Administered 2022-11-23: 30 g via ORAL
  Filled 2022-11-23: qty 60

## 2022-11-23 NOTE — Discharge Instructions (Addendum)
Your labs, exam, and CT scan are negative for signs of small bowel obstruction.  You have evidence of obstipation as well as a left-sided stone in the ureter and a stable stone in the left kidney.  Take the prescription meds as directed.  Consider liquid diet over the next 24 to 40 hours to help promote normal stool passage.  Take the lactulose daily as prescribed.  Follow-up with your primary provider or urology as discussed.

## 2022-11-23 NOTE — ED Provider Notes (Signed)
[facility] Emergency Department Provider Note     Event Date/Time   First MD Initiated Contact with Patient 11/23/22 1507     (approximate)   History   Abdominal Pain (Note from UC - "epigastric pain that goes through to the back and also having lower back pain Constipation, gained 6-7 pounds in the weeks, Last BM about 2 weeks ago Currently passing marble stone sized bowel, still able to pass gas, Pain 7/10 before 10mg  oxycodone at 11:00 now 5/10, some nausea and no vomiting Currently tried 2 cups MiraLAX in the last 2 days, Laxative, magnesium citrate (1-bottle)Bowel blockage 2021, Hx of colitis")   HPI  Breanna Ramos is a 52 y.o. female with a history of chronic constipation, chronic LBP on opioid therapy, DM, HTN, SBO, TIA, iron deficiency and endometriosis, presents to the ED for evaluation of abdominal pain and constipation.  Patient reports her last bowel movement was approximately 2 weeks ago.  She is only since that time passed several small firm stool balls.  She does endorse daily use of her oxycodone for chronic low back pain.  She manages her constipation however, with daily sweet potato doses and MiraLAX as needed.  She reports a recent vacation complicated her symptoms by getting her off schedule.  She gives remote history of a partial SBO treated with parenteral nutrition as she has facial anatomy that was not amenable to an NG tube.  Denies any nausea, feculent vomiting, or malodorous belching.  She also denies any fecal incontinence.   Physical Exam   Triage Vital Signs: ED Triage Vitals [11/23/22 1500]  Enc Vitals Group     BP (!) 141/89     Pulse Rate 97     Resp 16     Temp 98.7 F (37.1 C)     Temp Source Oral     SpO2 98 %     Weight 194 lb 0.1 oz (88 kg)     Height 5\' 6"  (1.676 m)     Head Circumference      Peak Flow      Pain Score 5     Pain Loc      Pain Edu?      Excl. in GC?     Most recent vital signs: Vitals:    11/23/22 1934 11/23/22 2021  BP: (!) 152/95 (!) 153/94  Pulse:  98  Resp: 16 16  Temp: 99 F (37.2 C) 98.9 F (37.2 C)  SpO2: 97% 98%    General Awake, no distress. NAD CV:  Good peripheral perfusion.  RESP:  Normal effort.  ABD:  No distention.  Nontender.  Normoactive bowel sounds x 4.  No rebound, guarding, or rigidity noted.   ED Results / Procedures / Treatments   Labs (all labs ordered are listed, but only abnormal results are displayed) Labs Reviewed  COMPREHENSIVE METABOLIC PANEL - Abnormal; Notable for the following components:      Result Value   Calcium 8.8 (*)    ALT 49 (*)    All other components within normal limits  CBC WITH DIFFERENTIAL/PLATELET - Abnormal; Notable for the following components:   Eosinophils Absolute 0.6 (*)    All other components within normal limits  URINALYSIS, ROUTINE W REFLEX MICROSCOPIC - Abnormal; Notable for the following components:   Color, Urine YELLOW (*)    APPearance HAZY (*)    Leukocytes,Ua SMALL (*)    Bacteria, UA MANY (*)    All other  components within normal limits  LIPASE, BLOOD  POC URINE PREG, ED  TROPONIN I (HIGH SENSITIVITY)  TROPONIN I (HIGH SENSITIVITY)     EKG    RADIOLOGY  I personally viewed and evaluated these images as part of my medical decision making, as well as reviewing the written report by the radiologist.  ED Provider Interpretation: No acute SBO, normal appendix, no diverticulitis, moderate stool burden, nephrolithiasis on the left  CT ABDOMEN PELVIS W CONTRAST  Result Date: 11/23/2022 CLINICAL DATA:  Abdominal pain, bloating, possible bowel obstruction EXAM: CT ABDOMEN AND PELVIS WITH CONTRAST TECHNIQUE: Multidetector CT imaging of the abdomen and pelvis was performed using the standard protocol following bolus administration of intravenous contrast. RADIATION DOSE REDUCTION: This exam was performed according to the departmental dose-optimization program which includes automated exposure  control, adjustment of the mA and/or kV according to patient size and/or use of iterative reconstruction technique. CONTRAST:  OMNIPAQUE IOHEXOL 300 MG/ML  SOLN COMPARISON:  02/13/2020 FINDINGS: Lower chest: 4 mm subpleural left lower lobe nodule (series 4/image 18), not previously imaged, likely benign. Additional 2-4 mm subpleural left lower lobe nodules (series 4/images 38 and 50) are unchanged from 2020, benign. No follow-up is required per Fleischner Society guidelines. Hepatobiliary: 9 mm left hepatic lobe cyst (series 2/image 20). Status post cholecystectomy. No intrahepatic or extrahepatic dilatation but Pancreas: Within normal limits Spleen: Within normal limits. Adrenals/Urinary Tract: Adrenal glands are within normal limits. 6 mm nonobstructing left lower pole renal calculus. Right kidney is within normal limits. No hydronephrosis. Additional 6 mm distal left ureteral calculus at the UVJ. Bladder is within normal limits. Stomach/Bowel: Stomach is within normal limits. No evidence of bowel obstruction. Normal appendix (series 2/image 76). No colonic wall thickening or inflammatory changes. Moderate colonic stool burden, suggesting mild constipation. Vascular/Lymphatic: No evidence of abdominal aortic aneurysm. No suspicious abdominopelvic lymphadenopathy. Reproductive: Uterus is normal limits. Bilateral ovaries are within normal limits. Other: No abdominopelvic ascites. Musculoskeletal: Degenerative changes of the visualized thoracolumbar spine. Status post PLIF at L3-4, without evidence of complication. IMPRESSION: No evidence of bowel obstruction. Normal appendix. 6 mm nonobstructing left lower pole renal calculus. Additional 6 mm distal left ureteral calculus at the UVJ. No hydronephrosis. Electronically Signed   By: Charline Bills M.D.   On: 11/23/2022 19:36     PROCEDURES:  Critical Care performed: No  Procedures   MEDICATIONS ORDERED IN ED: Medications  iohexol (OMNIPAQUE) 9  MG/ML oral solution 500 mL (has no administration in time range)  sodium chloride 0.9 % bolus 1,000 mL (0 mLs Intravenous Stopped 11/23/22 2021)  oxyCODONE (Oxy IR/ROXICODONE) immediate release tablet 10 mg (10 mg Oral Given 11/23/22 1807)  iohexol (OMNIPAQUE) 300 MG/ML solution 100 mL (100 mLs Intravenous Contrast Given 11/23/22 1919)  lactulose (CHRONULAC) 10 GM/15ML solution 30 g (30 g Oral Given 11/23/22 2017)     IMPRESSION / MDM / ASSESSMENT AND PLAN / ED COURSE  I reviewed the triage vital signs and the nursing notes.                              Differential diagnosis includes, but is not limited to, ovarian cyst, ovarian torsion, acute appendicitis, diverticulitis, urinary tract infection/pyelonephritis, endometriosis, bowel obstruction, colitis, renal colic, gastroenteritis, hernia, fibroids, endometriosis, pregnancy related pain including ectopic pregnancy, etc.   Patient's presentation is most consistent with acute presentation with potential threat to life or bodily function.  Patient's diagnosis is consistent with moderate  constipation without evidence of SBO.  Patient with reassuring exam and workup that she presented with several days of constipation aggravated by recent vacation, and complicated by her chronic opioid use.  He presented in no acute distress without any evidence or reports of chronic intractable nausea and vomiting.  Patient was evaluated with labs and CT which were normal and reassuring without any elevations or white count or electrolyte abnormalities.  CT scan did confirm moderate stool burden with no other acute findings.  Did confirm her history of nephrolithiasis with a left 6 female ureteral stone.  Patient will be discharged home with prescriptions for lactulose and tamsulosin. Patient is to follow up with primary provider and urology as needed or otherwise directed. Patient is given ED precautions to return to the ED for any worsening or new symptoms.   FINAL  CLINICAL IMPRESSION(S) / ED DIAGNOSES   Final diagnoses:  Drug-induced constipation  Ureterolithiasis     Rx / DC Orders   ED Discharge Orders          Ordered    lactulose (CHRONULAC) 10 GM/15ML solution  Daily PRN        11/23/22 2001    tamsulosin (FLOMAX) 0.4 MG CAPS capsule  Daily after breakfast        11/23/22 2001             Note:  This document was prepared using Dragon voice recognition software and may include unintentional dictation errors.    Lissa Hoard, PA-C 11/23/22 2047    Trinna Post, MD 11/26/22 [phone]

## 2022-11-23 NOTE — ED Triage Notes (Signed)
epigastric pain that goes through to the back and also having lower back pain Constipation, gained 6-7 pounds in the weeks, Last BM about 2 weeks ago Currently passing marble stone sized bowel, still able to pass gas, Pain 7/10 before 10mg  oxycodone at 11:00 now 5/10, some nausea and no vomiting Currently tried 2 cups MiraLAX in the last 2 days, Laxative, magnesium citrate (1-bottle)Bowel blockage 2021, Hx of colitis

## 2022-11-23 NOTE — ED Triage Notes (Signed)
Pt to ed from [facility] for possible bowl obstruction. Pt was seen there today for abdominal pain and bloating. Pt last full normal BM was 6/14. Pt has taken all of the oTC meds with no relief. Pt is CAOx4, in no acute distress and ambulatory in triage.

## 2022-11-25 ENCOUNTER — Encounter: Payer: Self-pay | Admitting: Urgent Care

## 2022-11-25 ENCOUNTER — Encounter
Admission: RE | Admit: 2022-11-25 | Discharge: 2022-11-25 | Disposition: A | Discharge status: Home / Self Care | Payer: No Typology Code available for payment source | Source: Ambulatory Visit | Attending: Obstetrics and Gynecology | Admitting: Obstetrics and Gynecology

## 2022-11-25 DIAGNOSIS — N921 Excessive and frequent menstruation with irregular cycle: Secondary | ICD-10-CM | POA: Diagnosis present

## 2022-11-25 LAB — CBC
HCT: 39.1 % (ref 36.0–46.0)
Hemoglobin: 13 g/dL (ref 12.0–15.0)
MCH: 30 pg (ref 26.0–34.0)
MCHC: 33.2 g/dL (ref 30.0–36.0)
MCV: 90.3 fL (ref 80.0–100.0)
Platelets: 372 10*3/uL (ref 150–400)
RBC: 4.33 MIL/uL (ref 3.87–5.11)
RDW: 13.5 % (ref 11.5–15.5)
WBC: 11.5 10*3/uL — ABNORMAL HIGH (ref 4.0–10.5)
nRBC: 0 % (ref 0.0–0.2)

## 2022-11-25 LAB — TYPE AND SCREEN
ABO/RH(D): A POS
Antibody Screen: NEGATIVE

## 2022-12-03 ENCOUNTER — Other Ambulatory Visit: Payer: Self-pay

## 2022-12-03 ENCOUNTER — Ambulatory Visit: Payer: No Typology Code available for payment source | Admitting: Certified Registered"

## 2022-12-03 ENCOUNTER — Encounter
Admission: RE | Disposition: A | Discharge status: Home / Self Care | Payer: Self-pay | Source: Home / Self Care | Attending: Obstetrics and Gynecology

## 2022-12-03 ENCOUNTER — Ambulatory Visit
Admission: RE | Admit: 2022-12-03 | Discharge: 2022-12-03 | Disposition: A | Discharge status: Home / Self Care | Payer: No Typology Code available for payment source | Attending: Obstetrics and Gynecology | Admitting: Obstetrics and Gynecology

## 2022-12-03 ENCOUNTER — Encounter: Payer: Self-pay | Admitting: Obstetrics and Gynecology

## 2022-12-03 DIAGNOSIS — N838 Other noninflammatory disorders of ovary, fallopian tube and broad ligament: Secondary | ICD-10-CM | POA: Diagnosis not present

## 2022-12-03 DIAGNOSIS — F419 Anxiety disorder, unspecified: Secondary | ICD-10-CM | POA: Diagnosis not present

## 2022-12-03 DIAGNOSIS — K760 Fatty (change of) liver, not elsewhere classified: Secondary | ICD-10-CM | POA: Insufficient documentation

## 2022-12-03 DIAGNOSIS — I10 Essential (primary) hypertension: Secondary | ICD-10-CM | POA: Diagnosis not present

## 2022-12-03 DIAGNOSIS — F32A Depression, unspecified: Secondary | ICD-10-CM | POA: Diagnosis not present

## 2022-12-03 DIAGNOSIS — E119 Type 2 diabetes mellitus without complications: Secondary | ICD-10-CM | POA: Diagnosis not present

## 2022-12-03 DIAGNOSIS — N921 Excessive and frequent menstruation with irregular cycle: Secondary | ICD-10-CM | POA: Diagnosis present

## 2022-12-03 DIAGNOSIS — F17210 Nicotine dependence, cigarettes, uncomplicated: Secondary | ICD-10-CM | POA: Insufficient documentation

## 2022-12-03 DIAGNOSIS — Z79899 Other long term (current) drug therapy: Secondary | ICD-10-CM | POA: Insufficient documentation

## 2022-12-03 DIAGNOSIS — K219 Gastro-esophageal reflux disease without esophagitis: Secondary | ICD-10-CM | POA: Insufficient documentation

## 2022-12-03 DIAGNOSIS — N888 Other specified noninflammatory disorders of cervix uteri: Secondary | ICD-10-CM | POA: Insufficient documentation

## 2022-12-03 DIAGNOSIS — G4733 Obstructive sleep apnea (adult) (pediatric): Secondary | ICD-10-CM | POA: Diagnosis not present

## 2022-12-03 DIAGNOSIS — M797 Fibromyalgia: Secondary | ICD-10-CM | POA: Insufficient documentation

## 2022-12-03 DIAGNOSIS — N8003 Adenomyosis of the uterus: Secondary | ICD-10-CM | POA: Diagnosis not present

## 2022-12-03 DIAGNOSIS — Z01818 Encounter for other preprocedural examination: Secondary | ICD-10-CM

## 2022-12-03 DIAGNOSIS — Z7984 Long term (current) use of oral hypoglycemic drugs: Secondary | ICD-10-CM | POA: Diagnosis not present

## 2022-12-03 HISTORY — PX: CYSTOSCOPY: SHX5120

## 2022-12-03 HISTORY — PX: ROBOTIC ASSISTED LAPAROSCOPIC HYSTERECTOMY AND SALPINGECTOMY: SHX6379

## 2022-12-03 LAB — GLUCOSE, CAPILLARY
Glucose-Capillary: 124 mg/dL — ABNORMAL HIGH (ref 70–99)
Glucose-Capillary: 149 mg/dL — ABNORMAL HIGH (ref 70–99)

## 2022-12-03 LAB — ABO/RH: ABO/RH(D): A POS

## 2022-12-03 LAB — POCT PREGNANCY, URINE: Preg Test, Ur: NEGATIVE

## 2022-12-03 LAB — RPR: RPR Ser Ql: NONREACTIVE

## 2022-12-03 SURGERY — XI ROBOTIC ASSISTED LAPAROSCOPIC HYSTERECTOMY AND SALPINGECTOMY
Anesthesia: General | Site: Bladder

## 2022-12-03 MED ORDER — FENTANYL CITRATE (PF) 100 MCG/2ML IJ SOLN
INTRAMUSCULAR | Status: AC
  Filled 2022-12-03: qty 2

## 2022-12-03 MED ORDER — PROPOFOL 10 MG/ML IV BOLUS
INTRAVENOUS | Status: DC | PRN
  Administered 2022-12-03: 200 mg via INTRAVENOUS

## 2022-12-03 MED ORDER — CHLORHEXIDINE GLUCONATE 0.12 % MT SOLN
OROMUCOSAL | Status: AC
  Filled 2022-12-03: qty 15

## 2022-12-03 MED ORDER — BUPIVACAINE HCL 0.5 % IJ SOLN
INTRAMUSCULAR | Status: DC | PRN
  Administered 2022-12-03: 10 mL

## 2022-12-03 MED ORDER — HEMOSTATIC AGENTS (NO CHARGE) OPTIME
TOPICAL | Status: DC | PRN
  Administered 2022-12-03: 1 via TOPICAL

## 2022-12-03 MED ORDER — PHENYLEPHRINE HCL (PRESSORS) 10 MG/ML IV SOLN
INTRAVENOUS | Status: DC | PRN
  Administered 2022-12-03 (×2): 80 ug via INTRAVENOUS

## 2022-12-03 MED ORDER — LIDOCAINE HCL (CARDIAC) PF 100 MG/5ML IV SOSY
PREFILLED_SYRINGE | INTRAVENOUS | Status: DC | PRN
  Administered 2022-12-03: 60 mg via INTRAVENOUS

## 2022-12-03 MED ORDER — DEXMEDETOMIDINE HCL IN NACL 80 MCG/20ML IV SOLN
INTRAVENOUS | Status: DC | PRN
  Administered 2022-12-03 (×2): 4 ug via INTRAVENOUS
  Administered 2022-12-03: 12 ug via INTRAVENOUS

## 2022-12-03 MED ORDER — OXYCODONE HCL 5 MG PO TABS
5.0000 mg | ORAL_TABLET | Freq: Once | ORAL | Status: AC | PRN
  Administered 2022-12-03: 5 mg via ORAL

## 2022-12-03 MED ORDER — EPHEDRINE SULFATE (PRESSORS) 50 MG/ML IJ SOLN
INTRAMUSCULAR | Status: DC | PRN
  Administered 2022-12-03 (×3): 5 mg via INTRAVENOUS
  Administered 2022-12-03: 10 mg via INTRAVENOUS

## 2022-12-03 MED ORDER — CEFAZOLIN SODIUM-DEXTROSE 2-4 GM/100ML-% IV SOLN
2.0000 g | INTRAVENOUS | Status: AC
  Administered 2022-12-03: 2 g via INTRAVENOUS

## 2022-12-03 MED ORDER — OXYCODONE HCL 5 MG PO TABS
ORAL_TABLET | ORAL | Status: AC
  Filled 2022-12-03: qty 1

## 2022-12-03 MED ORDER — ACETAMINOPHEN 10 MG/ML IV SOLN
INTRAVENOUS | Status: DC | PRN
  Administered 2022-12-03: 650 mg via INTRAVENOUS

## 2022-12-03 MED ORDER — DEXAMETHASONE SODIUM PHOSPHATE 10 MG/ML IJ SOLN
INTRAMUSCULAR | Status: DC | PRN
  Administered 2022-12-03: 10 mg via INTRAVENOUS

## 2022-12-03 MED ORDER — SUGAMMADEX SODIUM 200 MG/2ML IV SOLN
INTRAVENOUS | Status: DC | PRN
  Administered 2022-12-03: 200 mg via INTRAVENOUS

## 2022-12-03 MED ORDER — PROPOFOL 10 MG/ML IV BOLUS
INTRAVENOUS | Status: AC
  Filled 2022-12-03: qty 20

## 2022-12-03 MED ORDER — MIDAZOLAM HCL 2 MG/2ML IJ SOLN
INTRAMUSCULAR | Status: AC
  Filled 2022-12-03: qty 2

## 2022-12-03 MED ORDER — KETAMINE HCL 50 MG/5ML IJ SOSY
PREFILLED_SYRINGE | INTRAMUSCULAR | Status: AC
  Filled 2022-12-03: qty 5

## 2022-12-03 MED ORDER — ROCURONIUM BROMIDE 100 MG/10ML IV SOLN
INTRAVENOUS | Status: DC | PRN
  Administered 2022-12-03: 60 mg via INTRAVENOUS
  Administered 2022-12-03: 20 mg via INTRAVENOUS
  Administered 2022-12-03: 10 mg via INTRAVENOUS
  Administered 2022-12-03 (×2): 20 mg via INTRAVENOUS

## 2022-12-03 MED ORDER — SODIUM CHLORIDE 0.9 % IV SOLN: INTRAVENOUS | Status: DC

## 2022-12-03 MED ORDER — 0.9 % SODIUM CHLORIDE (POUR BTL) OPTIME
TOPICAL | Status: DC | PRN
  Administered 2022-12-03: 500 mL

## 2022-12-03 MED ORDER — SOD CITRATE-CITRIC ACID 500-334 MG/5ML PO SOLN
30.0000 mL | ORAL | Status: DC
  Filled 2022-12-03: qty 30

## 2022-12-03 MED ORDER — ONDANSETRON HCL 4 MG/2ML IJ SOLN
INTRAMUSCULAR | Status: DC | PRN
  Administered 2022-12-03: 4 mg via INTRAVENOUS

## 2022-12-03 MED ORDER — FENTANYL CITRATE (PF) 100 MCG/2ML IJ SOLN
25.0000 ug | INTRAMUSCULAR | Status: DC | PRN
  Administered 2022-12-03 (×4): 25 ug via INTRAVENOUS

## 2022-12-03 MED ORDER — FUROSEMIDE 10 MG/ML IJ SOLN
INTRAMUSCULAR | Status: DC | PRN
  Administered 2022-12-03: 5 mg via INTRAMUSCULAR

## 2022-12-03 MED ORDER — OXYCODONE HCL 7.5 MG PO TABS
5.0000 mg | ORAL_TABLET | Freq: Four times a day (QID) | ORAL | 0 refills | Status: DC | PRN
Start: 2022-12-03 — End: 2023-01-29

## 2022-12-03 MED ORDER — OXYCODONE HCL 5 MG/5ML PO SOLN: 5.0000 mg | Freq: Once | ORAL | Status: AC | PRN

## 2022-12-03 MED ORDER — BUPIVACAINE HCL (PF) 0.5 % IJ SOLN
INTRAMUSCULAR | Status: AC
  Filled 2022-12-03: qty 30

## 2022-12-03 MED ORDER — MIDAZOLAM HCL 2 MG/2ML IJ SOLN
INTRAMUSCULAR | Status: DC | PRN
  Administered 2022-12-03: 2 mg via INTRAVENOUS

## 2022-12-03 MED ORDER — POVIDONE-IODINE 10 % EX SWAB
2.0000 | Freq: Once | CUTANEOUS | Status: AC
  Administered 2022-12-03: 2 via TOPICAL

## 2022-12-03 MED ORDER — FENTANYL CITRATE (PF) 100 MCG/2ML IJ SOLN
INTRAMUSCULAR | Status: DC | PRN
  Administered 2022-12-03 (×4): 50 ug via INTRAVENOUS

## 2022-12-03 MED ORDER — ORAL CARE MOUTH RINSE: 15.0000 mL | Freq: Once | OROMUCOSAL | Status: AC

## 2022-12-03 MED ORDER — KETAMINE HCL 10 MG/ML IJ SOLN
INTRAMUSCULAR | Status: DC | PRN
  Administered 2022-12-03: 30 mg via INTRAVENOUS
  Administered 2022-12-03: 20 mg via INTRAVENOUS

## 2022-12-03 MED ORDER — CEFAZOLIN SODIUM-DEXTROSE 2-4 GM/100ML-% IV SOLN
INTRAVENOUS | Status: AC
  Filled 2022-12-03: qty 100

## 2022-12-03 MED ORDER — CHLORHEXIDINE GLUCONATE 0.12 % MT SOLN
15.0000 mL | Freq: Once | OROMUCOSAL | Status: AC
  Administered 2022-12-03: 15 mL via OROMUCOSAL

## 2022-12-03 MED ORDER — KETOROLAC TROMETHAMINE 30 MG/ML IJ SOLN
INTRAMUSCULAR | Status: DC | PRN
  Administered 2022-12-03: 30 mg via INTRAVENOUS

## 2022-12-03 MED ORDER — SODIUM CHLORIDE 0.9 % IR SOLN
Status: DC | PRN
  Administered 2022-12-03 (×2): 100 mL

## 2022-12-03 SURGICAL SUPPLY — 74 items
ADH SKN CLS APL DERMABOND .7 (GAUZE/BANDAGES/DRESSINGS) ×2
APL SRG 38 LTWT LNG FL B (MISCELLANEOUS) ×2
APPLICATOR ARISTA FLEXITIP XL (MISCELLANEOUS) IMPLANT
BAG DRN RND TRDRP ANRFLXCHMBR (UROLOGICAL SUPPLIES) ×2
BAG URINE DRAIN 2000ML AR STRL (UROLOGICAL SUPPLIES) ×2 IMPLANT
BASIN KIT SINGLE STR (MISCELLANEOUS) ×2 IMPLANT
BLADE SURG 15 STRL LF DISP TIS (BLADE) ×2 IMPLANT
BLADE SURG 15 STRL SS (BLADE) ×2
BLADE SURG SZ11 CARB STEEL (BLADE) ×2 IMPLANT
CANNULA CAP OBTURATR AIRSEAL 8 (CAP) ×2 IMPLANT
CATH FOLEY 2WAY 5CC 16FR (CATHETERS) ×2
CATH URTH 16FR FL 2W BLN LF (CATHETERS) ×2 IMPLANT
COVER TIP SHEARS 8 DVNC (MISCELLANEOUS) ×2 IMPLANT
DERMABOND ADVANCED .7 DNX12 (GAUZE/BANDAGES/DRESSINGS) ×2 IMPLANT
DRAPE 3/4 80X56 (DRAPES) IMPLANT
DRAPE ARM DVNC X/XI (DISPOSABLE) ×8 IMPLANT
DRAPE COLUMN DVNC XI (DISPOSABLE) ×2 IMPLANT
DRAPE ROBOT W/ LEGGING 30X125 (DRAPES) ×2 IMPLANT
DRAPE UNDER BUTTOCK W/FLU (DRAPES) ×2 IMPLANT
DRIVER NDL MEGA SUTCUT DVNCXI (INSTRUMENTS) ×2 IMPLANT
DRIVER NDLE MEGA SUTCUT DVNCXI (INSTRUMENTS) ×2 IMPLANT
ELECT REM PT RETURN 9FT ADLT (ELECTROSURGICAL) ×2
ELECTRODE REM PT RTRN 9FT ADLT (ELECTROSURGICAL) ×2 IMPLANT
FORCEPS BPLR FENES DVNC XI (FORCEP) ×2 IMPLANT
FORCEPS BPLR R/ABLATION 8 DVNC (INSTRUMENTS) ×2 IMPLANT
GAUZE 4X4 16PLY ~~LOC~~+RFID DBL (SPONGE) ×2 IMPLANT
GLOVE BIO SURGEON STRL SZ7 (GLOVE) ×6 IMPLANT
GLOVE BIOGEL PI IND STRL 7.5 (GLOVE) ×6 IMPLANT
GOWN STRL REUS W/ TWL LRG LVL3 (GOWN DISPOSABLE) ×6 IMPLANT
GOWN STRL REUS W/ TWL XL LVL3 (GOWN DISPOSABLE) ×2 IMPLANT
GOWN STRL REUS W/TWL LRG LVL3 (GOWN DISPOSABLE) ×6
GOWN STRL REUS W/TWL XL LVL3 (GOWN DISPOSABLE) ×2
HEMOSTAT ARISTA ABSORB 3G PWDR (HEMOSTASIS) IMPLANT
IRRIGATOR SUCT 8 DISP DVNC XI (IRRIGATION / IRRIGATOR) IMPLANT
IV LACTATED RINGERS 1000ML (IV SOLUTION) ×2 IMPLANT
IV NS 1000ML (IV SOLUTION) ×4
IV NS 1000ML BAXH (IV SOLUTION) ×2 IMPLANT
KIT PINK PAD W/HEAD ARE REST (MISCELLANEOUS) ×2
KIT PINK PAD W/HEAD ARM REST (MISCELLANEOUS) ×2 IMPLANT
KIT TURNOVER CYSTO (KITS) ×2 IMPLANT
LABEL OR SOLS (LABEL) ×2 IMPLANT
MANIFOLD NEPTUNE II (INSTRUMENTS) ×2 IMPLANT
MANIPULATOR VCARE LG CRV RETR (MISCELLANEOUS) IMPLANT
MANIPULATOR VCARE SML CRV RETR (MISCELLANEOUS) IMPLANT
MANIPULATOR VCARE STD CRV RETR (MISCELLANEOUS) IMPLANT
NDL HYPO 22X1.5 SAFETY MO (MISCELLANEOUS) ×2 IMPLANT
NEEDLE HYPO 22X1.5 SAFETY MO (MISCELLANEOUS) ×2 IMPLANT
NS IRRIG 500ML POUR BTL (IV SOLUTION) ×2 IMPLANT
OBTURATOR OPTICAL STND 8 DVNC (TROCAR) ×2
OBTURATOR OPTICALSTD 8 DVNC (TROCAR) ×2 IMPLANT
OCCLUDER COLPOPNEUMO (BALLOONS) ×2 IMPLANT
PACK LAP CHOLECYSTECTOMY (MISCELLANEOUS) ×2 IMPLANT
PAD PREP OB/GYN DISP 24X41 (PERSONAL CARE ITEMS) ×2 IMPLANT
SCISSORS MNPLR CVD DVNC XI (INSTRUMENTS) ×2 IMPLANT
SCRUB CHG 4% DYNA-HEX 4OZ (MISCELLANEOUS) ×2 IMPLANT
SEAL UNIV 5-12 XI (MISCELLANEOUS) ×6 IMPLANT
SEALER VESSEL EXT DVNC XI (MISCELLANEOUS) IMPLANT
SET CYSTO W/LG BORE CLAMP LF (SET/KITS/TRAYS/PACK) ×2 IMPLANT
SET TUBE FILTERED XL AIRSEAL (SET/KITS/TRAYS/PACK) ×2 IMPLANT
SOL ELECTROSURG ANTI STICK (MISCELLANEOUS) ×2
SOLUTION ELECTROSURG ANTI STCK (MISCELLANEOUS) ×2 IMPLANT
SPONGE T-LAP 18X18 ~~LOC~~+RFID (SPONGE) IMPLANT
SURGILUBE 2OZ TUBE FLIPTOP (MISCELLANEOUS) ×2 IMPLANT
SUT DVC VLOC 180 0 12IN GS21 (SUTURE) ×2
SUT MNCRL 4-0 (SUTURE) ×4
SUT MNCRL 4-0 27XMFL (SUTURE) ×4
SUT VIC AB 0 CT2 27 (SUTURE) ×2 IMPLANT
SUT VLOC 90 S/L VL9 GS22 (SUTURE) ×2 IMPLANT
SUTURE DVC VLC 180 0 12IN GS21 (SUTURE) IMPLANT
SUTURE MNCRL 4-0 27XMF (SUTURE) ×2 IMPLANT
SYR 10ML LL (SYRINGE) ×2 IMPLANT
SYR 50ML LL SCALE MARK (SYRINGE) ×2 IMPLANT
TRAP FLUID SMOKE EVACUATOR (MISCELLANEOUS) ×2 IMPLANT
WATER STERILE IRR 500ML POUR (IV SOLUTION) ×2 IMPLANT

## 2022-12-03 NOTE — Anesthesia Procedure Notes (Signed)
Procedure Name: Intubation Date/Time: 12/03/2022 7:46 AM  Performed by: Cheral Bay, CRNAPre-anesthesia Checklist: Patient identified, Emergency Drugs available, Suction available and Patient being monitored Patient Re-evaluated:Patient Re-evaluated prior to induction Oxygen Delivery Method: Circle system utilized Preoxygenation: Pre-oxygenation with 100% oxygen Induction Type: IV induction Ventilation: Mask ventilation without difficulty Laryngoscope Size: McGraph and 3 Grade View: Grade III Tube type: Oral Number of attempts: 1 Airway Equipment and Method: Stylet Placement Confirmation: ETT inserted through vocal cords under direct vision, positive ETCO2 and breath sounds checked- equal and bilateral Secured at: 21 cm Tube secured with: Tape Dental Injury: Teeth and Oropharynx as per pre-operative assessment

## 2022-12-03 NOTE — Interval H&P Note (Signed)
History and Physical Interval Note:  12/03/2022 7:30 AM  Breanna Ramos  has presented today for surgery, with the diagnosis of menorrhagia with irregular cycle, adenomyosis.  The various methods of treatment have been discussed with the patient and family. After consideration of risks, benefits and other options for treatment, the patient has consented to  Procedure(s): XI ROBOTIC ASSISTED LAPAROSCOPIC HYSTERECTOMY AND SALPINGECTOMY (Bilateral) CYSTOSCOPY (N/A) as a surgical intervention.  The patient's history has been reviewed, patient examined, no change in status, stable for surgery.  I have reviewed the patient's chart and labs.  Questions were answered to the patient's satisfaction.    Thomasene Mohair, MD, [facility] OB/[facility]N 12/03/2022 7:30 AM

## 2022-12-03 NOTE — Op Note (Signed)
Operative Note    Name: Breanna Ramos  Date of Service: 12/03/2022   DOB: April 19, 1971  MRN: [id number]    Pre-Operative Diagnosis:  1) Menorrhagia with irregular cycle 2) Adenomyosis  Post-Operative Diagnosis:  1) Menorrhagia with irregular cycle 2) Adenomyosis  Procedures:  1) Robot assisted Total Laparoscopic Hysterectomy, bilateral salpingectomy  2) Cystoscopy  Primary Surgeon: Thomasene Mohair, MD   EBL: 25 mL   IVF: 1,400 mL   Urine output: 175 mL  Specimens: Uterus, cervix, and bilateral fallopian tubes  Drains: none  Complications: None   Disposition: PACU   Condition: Stable   Findings:  1) Mildly enlarged uterus with mild anterior adhesions 2) normal appearing ovaries (small follicle noted on right), bilateral fallopian tubes 3) Normal cystoscopy with efflux of urine from the bilateral ureteral orifices and no noted defects in the bladder wall.   Procedure Summary:  The patient was taken to the operating room where general anesthesia was administered and found to be adequate. She was placed in the dorsal supine lithotomy position in [place] stirrups and prepped and draped in the usual, sterile fashion. After a timeout was called an indwelling catheter was placed in her bladder.  A sterile speculum was placed in her vagina.  The anterior lip of the cervix was grasped with the single-tooth tenaculum.  The cervix was serially dilated to an 11 Pratt dilator.  The large Vcare device was placed in accordance to the manufacturer's recommendations.  The tenaculum and speculum were removed.   Attention was turned to the abdomen where after injection of local anesthetic, an 8 mm infraumbilical incision was made with the scalpel. Entry into the abdomen was obtained via Optiview trocar technique (a blunt entry technique with camera visualization through the obturator upon entry). Verification of entry into the abdomen was obtained using opening pressures. The abdomen was  insufflated with CO2. The camera was introduced through the trocar with verification of atraumatic entry.  Right and left abdominal entry sites were created after injection of local anesthetic about 8 cm lateral to the umbilical port in accordance with the Intuitive manufacturer's recommendations.  An additional port was placed 8 cm lateral to the right abdominal port with verification of clearance above the iliac crest by more than 2 cm.  The port sites were 8 mm.  The intuitive trochars were introduced under intra-abdominal camera visualization without difficulty   The XI robot was docked on the patient's left.  Clearance was verified from the patient's legs.  Through the umbilical port the camera was placed.  Through the port attached to arm 3 the monopolar scissors were placed.  Through the port attached to arm 4 the forced bipolar forceps were was placed.  The fenestrated bipolar forceps were attached to port 1.   An inspection was undertaken of the pelvis with the above-noted findings. The bilateral ureters were identified and found to be well away from the operative area of interest. The right fallopian tube was grasped at the fimbriated end and was transected along the mesosalpinx in a lateral-to-medial fashion.  The tube was liberated from the corunal region of the uterus. The same procedure was carried out on the left side. Both fallopian tubes were passed from the body cavity at this point.  The round ligament was cauterized and transected on the right. The broad ligament was opened posteriorly and anteriorly.  The retroperitoneal space was divided until the ureter was identified and found to have a normal course.  The utero-ovarian ligament was transected.  Tissue was divided along the right broad ligament to the level of the internal cervical os.  Bladder tissue was dissected off the lower uterine segment and cervix without difficulty. Care was taken to take down the moderate adhesions noted. The  right uterine artery was skeletonized and identified and after ligation was transected. The same procedure was carried out on the left side. The colpotomy was performed using monopolar electrocautery in a circumferential fashion following the KOH ring.  The uterus and cervix were removed through the vagina.   Closure of the vaginal cuff was undertaken using the V-lock stitch in a running fashion.  All vascular pedicles were inspected and found to be hemostatic.  The intra-abdominal pressure was lowered to 5 mmHg and ongoing hemostasis was noted.  Arista 3 grams was placed along all operative pedicles to ensure ongoing hemostasis.  All instruments removed from the robotic ports.  The robot was undocked from the patient.  The abdomen was then desufflated of CO2 with the aid of 5 deep breaths from anesthesia.  All trochars were then removed.  All skin incisions were closed using 4-0 Vicryl in a subcuticular fashion and reinforced using surgical skin glue.   Cystoscopy was undertaken at this point. The Foley catheter was removed and the 70 cystoscope was gently introduced through the urethra. The bladder survey was undertaken with efflux of urine from both orifices noted. There were no defects noted in the bladder wall. The cystoscope was removed.  A speculum was placed in the vagina and the vaginal cuff was hemostatic.  Visualization was also undertaken to ensure no instruments or sponges remained and this was verified to be clear. The speculum was removed.   The patient tolerated the procedure well.  Sponge, lap, needle, and instrument counts were correct x 2.  VTE prophylaxis: SCDs. Antibiotic prophylaxis: Ancef 2 grams IV prior to skin incision. She was awakened in the operating room and was taken to the PACU in stable condition.   Thomasene Mohair, MD, [facility] OB/GYN 12/03/2022 11:04 AM

## 2022-12-03 NOTE — Anesthesia Preprocedure Evaluation (Signed)
Anesthesia Evaluation  Patient identified by MRN, date of birth, ID band Patient awake    Reviewed: Allergy & Precautions, NPO status , Patient's Chart, lab work & pertinent test results  History of Anesthesia Complications Negative for: history of anesthetic complications  Airway Mallampati: II  TM Distance: >3 FB Neck ROM: full    Dental  (+) Chipped, Dental Advidsory Given   Pulmonary sleep apnea , Current Smoker and Patient abstained from smoking.   Pulmonary exam normal        Cardiovascular hypertension, (-) angina (-) Past MI and (-) CABG negative cardio ROS Normal cardiovascular exam(-) dysrhythmias      Neuro/Psych  PSYCHIATRIC DISORDERS      TIA   GI/Hepatic negative GI ROS, Neg liver ROS,,,  Endo/Other  negative endocrine ROSdiabetes    Renal/GU      Musculoskeletal   Abdominal   Peds  Hematology negative hematology ROS (+)   Anesthesia Other Findings Past Medical History: No date: Acute focal neurologic deficit with complete resolution No date: Anxiety No date: Arthritis No date: Carpal tunnel syndrome No date: Cerebral venous sinus thrombosis No date: Cerebral venous sinus thrombosis No date: Chronic, continuous use of opioids No date: Depression No date: Diabetes mellitus without complication (HCC) No date: Fibromyalgia No date: GERD (gastroesophageal reflux disease) No date: H/O cardiac radiofrequency ablation 08/2020: History of rectal bleeding No date: Hypertension No date: Iron deficiency anemia 11/23/2022: Kidney stone     Comment:  ? 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) No date: Nicotine dependence No date: Normocytic anemia No date: On long term drug therapy No date: OSA on CPAP No date: Pituitary tumor No date: PSVT (paroxysmal supraventricular tachycardia) No date: Rectal bleeding No date: SBO (small bowel obstruction) (HCC)  Past Surgical History: No date:  BACK SURGERY     Comment:  x 2 No date: CARDIAC ELECTROPHYSIOLOGY STUDY AND ABLATION No date: CESAREAN SECTION No date: CHOLECYSTECTOMY 03/07/2020: COLONOSCOPY WITH PROPOFOL; N/A     Comment:  Procedure: COLONOSCOPY WITH PROPOFOL;  Surgeon: Toney Reil, MD;  Location: [facility] ENDOSCOPY;  Service:               Gastroenterology;  Laterality: N/A; No date: FRACTURE SURGERY     Comment:  Left Ankle  BMI    Body Mass Index: 30.84 kg/m      Reproductive/Obstetrics negative OB ROS                             Anesthesia Physical Anesthesia Plan  ASA: 3  Anesthesia Plan: General ETT and General   Post-op Pain Management:    Induction: Intravenous  PONV Risk Score and Plan: 4 or greater and Ondansetron, Dexamethasone and Midazolam  Airway Management Planned: Oral ETT  Additional Equipment:   Intra-op Plan:   Post-operative Plan: Extubation in OR  Informed Consent: I have reviewed the patients History and Physical, chart, labs and discussed the procedure including the risks, benefits and alternatives for the proposed anesthesia with the patient or authorized representative who has indicated his/her understanding and acceptance.     Dental Advisory Given  Plan Discussed with: Anesthesiologist, CRNA and Surgeon  Anesthesia Plan Comments: (Patient consented for risks of anesthesia including but not limited to:  - adverse reactions to medications - damage to eyes, teeth, lips or other oral mucosa - nerve damage due  to positioning  - sore throat or hoarseness - Damage to heart, brain, nerves, lungs, other parts of body or loss of life  Patient voiced understanding.)       Anesthesia Quick Evaluation

## 2022-12-03 NOTE — Discharge Instructions (Signed)

## 2022-12-03 NOTE — Anesthesia Postprocedure Evaluation (Signed)
Anesthesia Post Note  Patient: Breanna Ramos  Procedure(s) Performed: XI ROBOTIC ASSISTED LAPAROSCOPIC HYSTERECTOMY AND SALPINGECTOMY (Bilateral: Abdomen) CYSTOSCOPY (Bladder)  Patient location during evaluation: PACU Anesthesia Type: General Level of consciousness: awake and alert Pain management: pain level controlled Vital Signs Assessment: post-procedure vital signs reviewed and stable Respiratory status: spontaneous breathing, nonlabored ventilation, respiratory function stable and patient connected to nasal cannula oxygen Cardiovascular status: blood pressure returned to baseline and stable Postop Assessment: no apparent nausea or vomiting Anesthetic complications: no  No notable events documented.   Last Vitals:  Vitals:   12/03/22 1115 12/03/22 1130  BP: 126/71 132/85  Pulse: 78 76  Resp: 20 13  Temp: 36.7 C   SpO2: 95% 96%    Last Pain:  Vitals:   12/03/22 1130  TempSrc:   PainSc: 5                  Stephanie Coup

## 2022-12-03 NOTE — Transfer of Care (Signed)
Immediate Anesthesia Transfer of Care Note  Patient: Breanna Ramos  Procedure(s) Performed: XI ROBOTIC ASSISTED LAPAROSCOPIC HYSTERECTOMY AND SALPINGECTOMY (Bilateral: Abdomen) CYSTOSCOPY (Bladder)  Patient Location: PACU  Anesthesia Type:General  Level of Consciousness: awake  Airway & Oxygen Therapy: Patient Spontanous Breathing  Post-op Assessment: Report given to RN and Post -op Vital signs reviewed and stable  Post vital signs: Reviewed and stable  Last Vitals:  Vitals Value Taken Time  BP 126/71 12/03/22 1112  Temp    Pulse 78 12/03/22 1115  Resp 10 12/03/22 1115  SpO2 94 % 12/03/22 1115  Vitals shown include unvalidated device data.  Last Pain:  Vitals:   12/03/22 0639  TempSrc: Temporal  PainSc: 5          Complications: No notable events documented.

## 2023-01-05 ENCOUNTER — Other Ambulatory Visit: Payer: Self-pay | Admitting: Podiatry

## 2023-01-09 ENCOUNTER — Other Ambulatory Visit: Payer: No Typology Code available for payment source

## 2023-01-19 ENCOUNTER — Encounter: Payer: Self-pay | Admitting: *Deleted

## 2023-01-20 ENCOUNTER — Encounter: Payer: Self-pay | Admitting: Podiatry

## 2023-01-28 NOTE — Discharge Instructions (Addendum)
[facility]  POST OPERATIVE INSTRUCTIONS FOR DR. Ether Griffins AND DR. Sharanda Shinault [facility] CLINIC PODIATRY DEPARTMENT   Take your medication as prescribed.  Pain medication should be taken only as needed.  Keep the dressing clean, dry and intact.  Would recommend for the first 24 hours trying to stay off your left foot as much as possible and then after that time may weight-bear as tolerated if doing okay.  For the next 2 weeks try to keep weightbearing to a minimum.  Would also recommend sleeping in the cam boot as it will be acting as a cast or a splint.  Keep your foot elevated above the heart level for the first 48 hours.  Continue elevation thereafter to improve swelling.  May also apply ice to the front of the left ankle for maximum 10 minutes out of every 1 hour as needed.  Walking to the bathroom and brief periods of walking are acceptable, unless we have instructed you to be non-weight bearing.  Always wear your post-op shoe when walking.  Always use your crutches if you are to be non-weight bearing.  Do not take a shower. Baths are permissible as long as the foot is kept out of the water.   Every hour you are awake:  Bend your knee 15 times. Massage calf 15 times  Call [facility] [phone]) if any of the following problems occur: You develop a temperature or fever. The bandage becomes saturated with blood. Medication does not stop your pain. Injury of the foot occurs. Any symptoms of infection including redness, odor, or red streaks running from wound.

## 2023-01-29 ENCOUNTER — Ambulatory Visit: Payer: Self-pay

## 2023-01-29 ENCOUNTER — Other Ambulatory Visit: Payer: Self-pay

## 2023-01-29 ENCOUNTER — Encounter
Admission: RE | Disposition: A | Discharge status: Home / Self Care | Payer: Self-pay | Source: Home / Self Care | Attending: Podiatry

## 2023-01-29 ENCOUNTER — Encounter: Payer: Self-pay | Admitting: Podiatry

## 2023-01-29 ENCOUNTER — Ambulatory Visit: Payer: No Typology Code available for payment source | Admitting: Anesthesiology

## 2023-01-29 ENCOUNTER — Ambulatory Visit
Admission: RE | Admit: 2023-01-29 | Discharge: 2023-01-29 | Disposition: A | Discharge status: Home / Self Care | Payer: No Typology Code available for payment source | Attending: Podiatry | Admitting: Podiatry

## 2023-01-29 DIAGNOSIS — M797 Fibromyalgia: Secondary | ICD-10-CM | POA: Diagnosis not present

## 2023-01-29 DIAGNOSIS — M25872 Other specified joint disorders, left ankle and foot: Secondary | ICD-10-CM | POA: Diagnosis not present

## 2023-01-29 DIAGNOSIS — E119 Type 2 diabetes mellitus without complications: Secondary | ICD-10-CM | POA: Diagnosis not present

## 2023-01-29 DIAGNOSIS — T8484XA Pain due to internal orthopedic prosthetic devices, implants and grafts, initial encounter: Secondary | ICD-10-CM | POA: Insufficient documentation

## 2023-01-29 DIAGNOSIS — Z79899 Other long term (current) drug therapy: Secondary | ICD-10-CM | POA: Insufficient documentation

## 2023-01-29 DIAGNOSIS — G8929 Other chronic pain: Secondary | ICD-10-CM | POA: Insufficient documentation

## 2023-01-29 DIAGNOSIS — G4733 Obstructive sleep apnea (adult) (pediatric): Secondary | ICD-10-CM | POA: Diagnosis not present

## 2023-01-29 DIAGNOSIS — X58XXXA Exposure to other specified factors, initial encounter: Secondary | ICD-10-CM | POA: Insufficient documentation

## 2023-01-29 DIAGNOSIS — M19172 Post-traumatic osteoarthritis, left ankle and foot: Secondary | ICD-10-CM | POA: Insufficient documentation

## 2023-01-29 DIAGNOSIS — D509 Iron deficiency anemia, unspecified: Secondary | ICD-10-CM | POA: Insufficient documentation

## 2023-01-29 DIAGNOSIS — Z8673 Personal history of transient ischemic attack (TIA), and cerebral infarction without residual deficits: Secondary | ICD-10-CM | POA: Insufficient documentation

## 2023-01-29 DIAGNOSIS — M7752 Other enthesopathy of left foot: Secondary | ICD-10-CM | POA: Diagnosis not present

## 2023-01-29 DIAGNOSIS — F17210 Nicotine dependence, cigarettes, uncomplicated: Secondary | ICD-10-CM | POA: Insufficient documentation

## 2023-01-29 DIAGNOSIS — Z79891 Long term (current) use of opiate analgesic: Secondary | ICD-10-CM | POA: Insufficient documentation

## 2023-01-29 DIAGNOSIS — M19072 Primary osteoarthritis, left ankle and foot: Secondary | ICD-10-CM

## 2023-01-29 HISTORY — DX: Pulsatile tinnitus, unspecified ear: H93.A9

## 2023-01-29 HISTORY — DX: Other ill-defined heart diseases: I51.89

## 2023-01-29 HISTORY — PX: BONE EXCISION: SHX6730

## 2023-01-29 HISTORY — PX: MINOR HARDWARE REMOVAL: SHX6474

## 2023-01-29 HISTORY — PX: ANKLE ARTHROSCOPY: SHX545

## 2023-01-29 LAB — GLUCOSE, CAPILLARY: Glucose-Capillary: 107 mg/dL — ABNORMAL HIGH (ref 70–99)

## 2023-01-29 SURGERY — ARTHROSCOPY, ANKLE
Anesthesia: General | Site: Ankle | Laterality: Left

## 2023-01-29 MED ORDER — LACTATED RINGERS IV SOLN: INTRAVENOUS | Status: DC

## 2023-01-29 MED ORDER — PROPOFOL 10 MG/ML IV BOLUS
INTRAVENOUS | Status: DC | PRN
  Administered 2023-01-29: 190 mg via INTRAVENOUS

## 2023-01-29 MED ORDER — PHENYLEPHRINE HCL (PRESSORS) 10 MG/ML IV SOLN
INTRAVENOUS | Status: DC | PRN
Start: 2023-01-29 — End: 2023-01-29
  Administered 2023-01-29 (×2): 50 ug via INTRAVENOUS
  Administered 2023-01-29: 100 ug via INTRAVENOUS

## 2023-01-29 MED ORDER — LACTATED RINGERS IR SOLN
Status: DC | PRN
  Administered 2023-01-29: 1000 mL
  Administered 2023-01-29: 6000 mL

## 2023-01-29 MED ORDER — OXYCODONE HCL 5 MG PO TABS
5.0000 mg | ORAL_TABLET | Freq: Once | ORAL | Status: AC
  Administered 2023-01-29: 5 mg via ORAL

## 2023-01-29 MED ORDER — EPHEDRINE SULFATE (PRESSORS) 50 MG/ML IJ SOLN
INTRAMUSCULAR | Status: DC | PRN
Start: 2023-01-29 — End: 2023-01-29
  Administered 2023-01-29: 10 mg via INTRAVENOUS
  Administered 2023-01-29 (×2): 5 mg via INTRAVENOUS
  Administered 2023-01-29: 10 mg via INTRAVENOUS
  Administered 2023-01-29 (×2): 5 mg via INTRAVENOUS

## 2023-01-29 MED ORDER — MIDAZOLAM HCL 5 MG/5ML IJ SOLN
INTRAMUSCULAR | Status: DC | PRN
  Administered 2023-01-29: 1 mg via INTRAVENOUS

## 2023-01-29 MED ORDER — MIDAZOLAM HCL 2 MG/2ML IJ SOLN
2.0000 mg | Freq: Once | INTRAMUSCULAR | Status: AC
  Administered 2023-01-29: 2 mg via INTRAVENOUS

## 2023-01-29 MED ORDER — BUPIVACAINE HCL (PF) 0.5 % IJ SOLN
INTRAMUSCULAR | Status: DC | PRN
Start: 2023-01-29 — End: 2023-01-29
  Administered 2023-01-29 (×2): 10 mL

## 2023-01-29 MED ORDER — FENTANYL CITRATE PF 50 MCG/ML IJ SOSY
50.0000 ug | PREFILLED_SYRINGE | INTRAMUSCULAR | Status: DC | PRN
  Administered 2023-01-29 (×2): 50 ug via INTRAVENOUS

## 2023-01-29 MED ORDER — DEXAMETHASONE SODIUM PHOSPHATE 4 MG/ML IJ SOLN
INTRAMUSCULAR | Status: DC | PRN
  Administered 2023-01-29: 4 mg via INTRAVENOUS

## 2023-01-29 MED ORDER — FENTANYL CITRATE (PF) 100 MCG/2ML IJ SOLN
INTRAMUSCULAR | Status: DC | PRN
  Administered 2023-01-29: 50 ug via INTRAVENOUS

## 2023-01-29 MED ORDER — BUPIVACAINE LIPOSOME 1.3 % IJ SUSP
INTRAMUSCULAR | Status: DC | PRN
Start: 2023-01-29 — End: 2023-01-29
  Administered 2023-01-29 (×2): 10 mL

## 2023-01-29 MED ORDER — ACETAMINOPHEN 500 MG PO TABS
1000.0000 mg | ORAL_TABLET | Freq: Once | ORAL | Status: AC
  Administered 2023-01-29: 1000 mg via ORAL

## 2023-01-29 MED ORDER — LIDOCAINE HCL (CARDIAC) PF 100 MG/5ML IV SOSY
PREFILLED_SYRINGE | INTRAVENOUS | Status: DC | PRN
  Administered 2023-01-29: 100 mg via INTRATRACHEAL

## 2023-01-29 MED ORDER — ASPIRIN 81 MG PO TBEC: 81.0000 mg | DELAYED_RELEASE_TABLET | Freq: Two times a day (BID) | ORAL | 0 refills | Status: DC

## 2023-01-29 MED ORDER — OXYCODONE HCL 5 MG PO TABS: 5.0000 mg | ORAL_TABLET | Freq: Four times a day (QID) | ORAL | 0 refills | Status: AC | PRN

## 2023-01-29 MED ORDER — CEFAZOLIN SODIUM-DEXTROSE 2-4 GM/100ML-% IV SOLN
2.0000 g | INTRAVENOUS | Status: AC
  Administered 2023-01-29: 2 g via INTRAVENOUS

## 2023-01-29 SURGICAL SUPPLY — 53 items
BNDG CMPR 5X4 CHSV STRCH STRL (GAUZE/BANDAGES/DRESSINGS) ×1
BNDG CMPR STD VLCR NS LF 5.8X4 (GAUZE/BANDAGES/DRESSINGS) ×1
BNDG CMPR STD VLCR NS LF 5.8X6 (GAUZE/BANDAGES/DRESSINGS) ×1
BNDG COHESIVE 4X5 TAN STRL LF (GAUZE/BANDAGES/DRESSINGS) ×1 IMPLANT
BNDG ELASTIC 4X5.8 VLCR NS LF (GAUZE/BANDAGES/DRESSINGS) ×2 IMPLANT
BNDG ELASTIC 6X5.8 VLCR NS LF (GAUZE/BANDAGES/DRESSINGS) ×1 IMPLANT
BNDG ESMARCH 4 X 12 STRL LF (GAUZE/BANDAGES/DRESSINGS) ×1
BNDG ESMARCH 4X12 STRL LF (GAUZE/BANDAGES/DRESSINGS) ×1 IMPLANT
BNDG GAUZE DERMACEA FLUFF 4 (GAUZE/BANDAGES/DRESSINGS) ×1 IMPLANT
BNDG GZE DERMACEA 4 6PLY (GAUZE/BANDAGES/DRESSINGS) ×1
BOOT STEPPER DURA MED (SOFTGOODS) IMPLANT
COVER LIGHT HANDLE UNIVERSAL (MISCELLANEOUS) ×2 IMPLANT
DRAPE FLUOR MINI C-ARM 54X84 (DRAPES) IMPLANT
DRAPE STERI 35X30 U-POUCH (DRAPES) ×1 IMPLANT
DURAPREP 26ML APPLICATOR (WOUND CARE) ×1 IMPLANT
ELECT REM PT RETURN 9FT ADLT (ELECTROSURGICAL) ×1
ELECTRODE REM PT RTRN 9FT ADLT (ELECTROSURGICAL) ×1 IMPLANT
ETHIBOND 2 0 GREEN CT 2 30IN (SUTURE) IMPLANT
GAUZE SPONGE 4X4 12PLY STRL (GAUZE/BANDAGES/DRESSINGS) ×1 IMPLANT
GAUZE XEROFORM 1X8 LF (GAUZE/BANDAGES/DRESSINGS) ×1 IMPLANT
GLOVE BIOGEL PI IND STRL 7.5 (GLOVE) ×1 IMPLANT
GLOVE SURG SS PI 7.0 STRL IVOR (GLOVE) ×1 IMPLANT
GOWN STRL REUS W/ TWL LRG LVL3 (GOWN DISPOSABLE) ×2 IMPLANT
GOWN STRL REUS W/TWL LRG LVL3 (GOWN DISPOSABLE) ×2
IV LACTATED RINGER IRRG 3000ML (IV SOLUTION) ×3
IV LR IRRIG 3000ML ARTHROMATIC (IV SOLUTION) ×4 IMPLANT
IV NS 250ML (IV SOLUTION)
IV NS 250ML BAXH (IV SOLUTION) ×1 IMPLANT
KIT TURNOVER KIT A (KITS) ×1 IMPLANT
MANIFOLD NEPTUNE II (INSTRUMENTS) ×1 IMPLANT
NDL SPNL 18GX3.5 QUINCKE PK (NEEDLE) ×1 IMPLANT
NEEDLE SPNL 18GX3.5 QUINCKE PK (NEEDLE) ×1 IMPLANT
PACK EXTREMITY ARMC (MISCELLANEOUS) ×1 IMPLANT
PADDING CAST BLEND 4X4 NS (MISCELLANEOUS) ×4 IMPLANT
RASP SM TEAR CROSS CUT (RASP) IMPLANT
RESECTOR FULL RADIUS 2.9 (ORTHOPEDIC DISPOSABLE SUPPLIES) IMPLANT
SPLINT CAST 1 STEP 4X30 (MISCELLANEOUS) ×1 IMPLANT
STOCKINETTE TUB 6IN (MISCELLANEOUS) ×1 IMPLANT
STRAP ANKLE DISTRACTOR (MISCELLANEOUS) IMPLANT
SUT ETHILON 3-0 FS-10 30 BLK (SUTURE)
SUT ETHILON 4 0 PS 2 18 (SUTURE) IMPLANT
SUT MNCRL 4-0 (SUTURE)
SUT MNCRL 4-0 27XMFL (SUTURE)
SUT VIC AB 3-0 SH 27 (SUTURE)
SUT VIC AB 3-0 SH 27X BRD (SUTURE) IMPLANT
SUT VIC AB 4-0 SH 27 (SUTURE) ×1
SUT VIC AB 4-0 SH 27XANBCTRL (SUTURE) IMPLANT
SUTURE EHLN 3-0 FS-10 30 BLK (SUTURE) IMPLANT
SUTURE MNCRL 4-0 27XMF (SUTURE) IMPLANT
TUBING CONNECTING 10 (TUBING) IMPLANT
TUBING INFLOW SET DBFLO PUMP (TUBING) ×1 IMPLANT
TUBING OUTFLOW SET DBLFO PUMP (TUBING) ×1 IMPLANT
WAND TOPAZ MICRO DEBRIDER (MISCELLANEOUS) ×1 IMPLANT

## 2023-01-29 NOTE — Op Note (Signed)
PODIATRY / FOOT AND ANKLE SURGERY OPERATIVE REPORT    SURGEON: Rosetta Posner, DPM  PRE-OPERATIVE DIAGNOSIS: All left ankle 1.  Left ankle arthritis 2.  Left ankle impingement syndrome 3.  Left ankle prominent painful orthopedic hardware 4.  Left tibial bone spur  POST-OPERATIVE DIAGNOSIS: Same  PROCEDURE(S): Left ankle arthroscopy with extensive debridement Left ankle removal of hardware Left ankle medial malleolar tibial bone spur resection  HEMOSTASIS: Left thigh tourniquet  ANESTHESIA: general  ESTIMATED BLOOD LOSS: 5 cc  FINDING(S): 1.  Large amount of scar/fibrous tissue within the ankle joint, mild cartilaginous disease 2.  Distal to fibular screws appear to be broken, distal 3 screws in total appear to be prominent 3.  Free fragment at the medial malleolus bone spur off the tibia partially adhered to periosteum  PATHOLOGY/SPECIMEN(S): None  INDICATIONS:   Breanna Ramos is a 52 y.o. female who presents with chronic left ankle pain after sustaining a ankle fracture several years ago.  Patient had a CT scan which showed some broken hardware in the distal fibula as well as prominent screws.  Patient also complains of pain to the medial ankle as well where there is a large bone spur present to the area.  The CT scan showed that there is a free-floating bone fragment in the area off of the distal tibia/medial malleolus.  All treatment options were discussed with the patient both conservative and surgical attempts at correction include potential risks and complications and at this time patient is elected for surgical procedure consisting of left ankle arthroscopy with extensive debridement, removal of ankle hardware, and medial malleolar tibial bone spur resection.  No guarantees given.  Consent obtained prior to procedure.  Patient may still have some degree of chronic pain to left ankle.  Patient agreeable to procedure.  DESCRIPTION: After obtaining full informed written consent,  the patient was brought back to the operating room and placed supine upon the operating table.  The patient received IV antibiotics prior to induction.  A popliteal and saphenous nerve block was performed by anesthesia preoperatively.  After obtaining adequate anesthesia, the patient was prepped and draped in the standard fashion.  An Esmarch bandage was used to exsanguinate the left lower extremity and the pneumatic thigh tourniquet was inflated.  Attention was then directed to the anterior ankle where an 18-gauge needle was introduced into the ankle joint through the medial border medial to the tendon of the tibialis anterior.  10 cc of normal sterile saline was insufflated into the ankle joint.  At this time a small incision was made over the same area 1 cm in length and blunt dissection was continued down to the ankle joint till the ankle joint capsule was punctured.  A rush of fluid was seen indicating successful entry into the ankle joint.  The obturator and cannula was then placed into the ankle joint.  Once successfully placed the operator was removed and the scope was then applied with suction and inflow.  The ankle joint was insufflated and was evaluated.  There appeared to be a large amount of scar tissue and fibrous tissue present within the ankle joint making it difficult to visualize the ankle joint first.  The anterior lateral portal was then created through transillumination with a 1 cm incision at the anterior lateral ankle.  Blunt dissection was continued down to the ankle joint with a hemostat and the ankle joint capsule was punctured and a rush of fluid seen indicating successful entry into the ankle joint.  The shaver was then placed through the anterior lateral portal.  The shaver was then used to resect the large amount of fibrous/scar tissue present within the ankle joint.  After a while of debridement the ankle joint was able to be visualized and the talar as well as tibial articular  cartilage was seen and evaluated.  There appeared to be some mild cartilage damage throughout the ankle joint in general.  The anterior lateral ankle joint was then debrided further with a shaver removing all scar tissue/fibrous tissue present.  There appeared to be a fair amount of scar tissue present and impinging at the anterior ankle.  The combination of graspers/cutter instrumentation and shaver was used to remove this impinging tissue.  The ankle was then brought through range of motion and appeared to have smooth range of motion present overall with minimal impingement at this point.  The instrumentation was then switched such that the scope and cannula was placed to the anterior lateral portal and the shaver was placed to the medial portal.  Extensive debridement was once again performed removing all fibrous synovitic tissue as well as impinging tissues.  There once again appeared to be some mild cartilage damage present to the anterior medial and medial aspect of the ankle joint.  The ankle joint was then exsanguinated and the instrumentation was removed.  The incisions were reapproximated well coapted with 4-0 nylon.  Attention was then directed to the lateral ankle.  C-arm imaging was utilized to verify placement of incision over the lateral prominent screws and plate.  The incision was made over this area and deepened to the subcutaneous tissues utilizing sharp and blunt dissection care was taken to identify and retract all vital neural and vascular structures and all venous contributories were cauterized as necessary.  At this time a periosteal type of incision was made into the plate and the periosteum was reflected anteriorly and posteriorly thereby exposing the plate and distal 3 screws.  The 2 distal screws were broken and were removed rather easily.  The distal aspects of the screws were still left intact and bone.  The proximal screw was removed as well and did not appear to be broken.  This  appeared to greatly reduce the prominence over the lateral malleolus and distal fibula.  The surgical site was flushed with copious amounts normal sterile saline.  The subcutaneous tissue was reapproximated well coapted with 4-0 Vicryl and the skin was then reapproximated well coapted with 4-0 nylon.  Attention was then directed to the medial ankle over the prominent bone spur/prominent bone that was present over this area.  The incision is made and deepened through the subcutaneous tissues utilizing sharp and blunt dissection and care was taken to identify and retract all vital neurovascular structures and all venous contributories were cauterized as necessary.  At this time a periosteal incision was then made into the medial malleolus and distal tibia.  The periosteum was reflected anteriorly and posteriorly thereby exposing the medial malleolus and distal tibia at the operative site.  The bone fragment appeared to be fairly fused within the bone so a periosteal elevator was used to go around the bony surfaces until there was an area that was found where there was space between the bone fragment and the tibia/medial malleolus.  Once this was able to be found a plane was then made underneath the bone fragment.  Circumferential dissection was then performed around the bony fragment and the bony fragment was passed off from the  operative site after resection.  Any rough surfaces were then smoothed with a combination of rongeur and power rasp.  This appeared to adequately resect the prominence present over the medial malleolus.  The surgical site was flushed with copious amounts normal sterile saline.  The periosteum and subcutaneous tissue was reapproximated well coapted with 4-0 Vicryl.  The skin was then reapproximated with coapted with 4-0 nylon.  A postoperative dressing was then applied consisting of Xeroform to the incision lines followed by 4 x 4 gauze, gauze roll, Ace wrap, cam boot.  The pneumatic thigh  tourniquet was deflated prior to placing postoperative dressing and a prompt hyperemic response was noted to all digits left foot.  Patient tolerated the procedure and anesthesia well and was transferred to recovery room vital signs stable vascular status intact all toes left foot.  Following a period of postoperative monitoring the patient be discharged home with the appropriate orders, instructions, and medications.  Patient is to remain nonweightbearing for at least the next week to the left lower extremity and keep the boot on for the most part at all times.  COMPLICATIONS: None  CONDITION: Good, stable  Rosetta Posner, DPM

## 2023-01-29 NOTE — Anesthesia Preprocedure Evaluation (Addendum)
Anesthesia Evaluation  Patient identified by MRN, date of birth, ID band Patient awake    Reviewed: Allergy & Precautions, NPO status , Patient's Chart, lab work & pertinent test results  History of Anesthesia Complications Negative for: history of anesthetic complications  Airway Mallampati: II  TM Distance: >3 FB Neck ROM: Full    Dental no notable dental hx. (+) Teeth Intact   Pulmonary sleep apnea , neg COPD, Current SmokerPatient did not abstain from smoking.   Pulmonary exam normal breath sounds clear to auscultation       Cardiovascular Exercise Tolerance: Good METShypertension, (-) CAD and (-) Past MI (-) dysrhythmias  Rhythm:Regular Rate:Normal - Systolic murmurs    Neuro/Psych  PSYCHIATRIC DISORDERS Anxiety Depression    TIA   GI/Hepatic ,GERD  ,,(+)     (-) substance abuse    Endo/Other  diabetes    Renal/GU Renal diseasenegative Renal ROS     Musculoskeletal  (+) Arthritis ,  Fibromyalgia -, narcotic dependent  Abdominal   Peds  Hematology   Anesthesia Other Findings Past Medical History: No date: Acute focal neurologic deficit with complete resolution No date: Anxiety No date: Arthritis No date: Carpal tunnel syndrome 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) No date: Nicotine dependence No date: Normocytic anemia No date: On long term drug therapy No date: OSA on CPAP No date: Pituitary  tumor No date: PSVT (paroxysmal supraventricular tachycardia)     Comment:  None since ablation No date: Pulsatile tinnitus     Comment:  sporadic No date: Rectal bleeding No date: SBO (small bowel obstruction) (HCC) 07/02/2021: Stroke (HCC)     Comment:  Some short term memory issues  Reproductive/Obstetrics                             Anesthesia Physical Anesthesia Plan  ASA: 2  Anesthesia Plan: General   Post-op Pain Management: Regional block, Tylenol PO (pre-op) and Fentanyl IV   Induction: Intravenous  PONV Risk Score and Plan: 2 and Ondansetron, Dexamethasone and Midazolam  Airway Management Planned: LMA  Additional Equipment: None  Intra-op Plan:   Post-operative Plan: Extubation in OR  Informed Consent: I have reviewed the patients History and Physical, chart, labs and discussed the procedure including the risks, benefits and alternatives for the proposed anesthesia with the patient or authorized representative who has indicated his/her understanding and acceptance.     Dental advisory given  Plan Discussed with: CRNA and Surgeon  Anesthesia Plan Comments: (Discussed risks of anesthesia with patient, including PONV, sore throat, lip/dental/eye damage. Rare risks discussed as well, such as cardiorespiratory and neurological sequelae, and allergic reactions. Discussed the role of CRNA in patient's perioperative care. Patient understands. Patient counseled on benefits of smoking cessation, and increased perioperative risks associated with continued smoking.  Discussed r/b/a of adductor canal and popliteal block, including:  - bleeding, infection, nerve damage - poor or non functioning block. - reactions and toxicity to local anesthetic Patient understands. )       Anesthesia  Quick Evaluation

## 2023-01-29 NOTE — Progress Notes (Signed)
Assisted Zak ANMD with left, popliteal/saphenous, ultrasound guided block. Side rails up, monitors on throughout procedure. See vital signs in flow sheet. Tolerated Procedure well.

## 2023-01-29 NOTE — Anesthesia Postprocedure Evaluation (Signed)
Anesthesia Post Note  Patient: Breanna Ramos  Procedure(s) Performed: 96045 - ARTHROSCOPY DEBRIDEMENT EXTENSIVE (Left: Ankle) MINOR HARDWARE REMOVAL (Left: Ankle) 40981 - EXCISION BONE SPUR TIBIA (Left: Ankle)  Patient location during evaluation: PACU Anesthesia Type: General Level of consciousness: awake and alert Pain management: pain level controlled Vital Signs Assessment: post-procedure vital signs reviewed and stable Respiratory status: spontaneous breathing, nonlabored ventilation, respiratory function stable and patient connected to nasal cannula oxygen Cardiovascular status: blood pressure returned to baseline and stable Postop Assessment: no apparent nausea or vomiting Anesthetic complications: no   No notable events documented.   Last Vitals:  Vitals:   01/29/23 0930 01/29/23 0945  BP: 119/80 121/84  Pulse: 74 72  Resp: 12 19  Temp:    SpO2: 92% 95%    Last Pain:  Vitals:   01/29/23 0945  TempSrc:   PainSc: 6                  Corinda Gubler

## 2023-01-29 NOTE — Anesthesia Procedure Notes (Signed)
Anesthesia Regional Block: Adductor canal block   Pre-Anesthetic Checklist: , timeout performed,  Correct Patient, Correct Site, Correct Laterality,  Correct Procedure, Correct Position, site marked,  Risks and benefits discussed,  Surgical consent,  Pre-op evaluation,  At surgeon's request and post-op pain management  Laterality: Lower and Left  Prep: chloraprep       Needles:  Injection technique: Single-shot  Needle Type: Echogenic Needle     Needle Length: 9cm  Needle Gauge: 21     Additional Needles:   Procedures:,,,, ultrasound used (permanent image in chart),,    Narrative:  Injection made incrementally with aspirations every 5 mL.  Performed by: Personally  Anesthesiologist: Corinda Gubler, MD  Additional Notes: Patient's chart reviewed and they were deemed appropriate candidate for procedure, per surgeon's request. Patient educated about risks, benefits, and alternatives of the block including but not limited to: temporary or permanent nerve damage, bleeding, infection, damage to surround tissues, block failure, local anesthetic toxicity. Patient expressed understanding. A formal time-out was conducted consistent with institution rules.  Monitors were applied, and minimal sedation used (see nursing record). The site was prepped with skin prep and allowed to dry, and sterile gloves were used. A high frequency linear ultrasound probe with probe cover was utilized throughout. Femoral artery visualized at mid-thigh level, local anesthetic injected anterolateral to it, and echogenic block needle trajectory was monitored throughout. Hydrodissection of saphenous nerve visualized and appeared anatomically normal. Aspiration performed every 5ml. Blood vessels were avoided. All injections were performed without resistance and free of blood and paresthesias. The patient tolerated the procedure well.  Injectate: 10ml exparel + 10ml 0.5% bupivacaine

## 2023-01-29 NOTE — Anesthesia Procedure Notes (Signed)
Procedure Name: LMA Insertion Date/Time: 01/29/2023 7:46 AM  Performed by: Barbette Hair, CRNAPre-anesthesia Checklist: Patient identified, Emergency Drugs available, Suction available, Patient being monitored and Timeout performed Patient Re-evaluated:Patient Re-evaluated prior to induction Oxygen Delivery Method: Circle system utilized Preoxygenation: Pre-oxygenation with 100% oxygen Induction Type: IV induction LMA: LMA inserted LMA Size: 5.0 Tube type: Oral Number of attempts: 1 Placement Confirmation: positive ETCO2, CO2 detector and breath sounds checked- equal and bilateral Dental Injury: Teeth and Oropharynx as per pre-operative assessment

## 2023-01-29 NOTE — Anesthesia Procedure Notes (Signed)
 Anesthesia Regional Block: Popliteal block   Pre-Anesthetic Checklist: , timeout performed,  Correct Patient, Correct Site, Correct Laterality,  Correct Procedure, Correct Position, site marked,  Risks and benefits discussed,  Surgical consent,  Pre-op evaluation,  At surgeon's request and post-op pain management  Laterality: Lower and Left  Prep: chloraprep       Needles:  Injection technique: Single-shot  Needle Type: Echogenic Needle     Needle Length: 9cm  Needle Gauge: 21     Additional Needles:   Procedures:,,,, ultrasound used (permanent image in chart),,    Narrative:  Injection made incrementally with aspirations every 5 mL.  Performed by: Personally  Anesthesiologist: Corinda Gubler, MD  Additional Notes: Patient's chart reviewed and they were deemed appropriate candidate for procedure, at surgeon's request. Patient educated about risks, benefits, and alternatives of the block including but not limited to: temporary or permanent nerve damage, bleeding, infection, damage to surround tissues, block failure, local anesthetic toxicity. Patient expressed understanding. A formal time-out was conducted consistent with institution rules.  Monitors were applied, and minimal sedation used. The site was prepped with skin prep and allowed to dry, and sterile gloves were used. A high frequency linear ultrasound probe with probe cover was utilized throughout. Popliteal artery pulsatile and visualized in popliteal fossa along with adjacent sciatic nerve and its branch point, which appeared anatomically normal, local anesthetic injected around them just proximal to the branch point, and echogenic block needle trajectory was monitored throughout. Aspiration performed every 5ml. Blood vessels were avoided. All injections were performed without resistance and free of blood and paresthesias. The patient tolerated the procedure well.  Injectate: 10ml exparel + 10ml 0.5% bupivacaine

## 2023-01-29 NOTE — H&P (Signed)
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  The various methods of treatment have been discussed with the patient.  No guarantees were given.  After consideration of risks, benefits and other options for treatment, the patient has consented to surgery.  I have reviewed the patients' chart and labs.    PROCEDURE: LEFT ANKLE ARTHROSCOPY WITH EXTENSIVE DEBRIDEMENT REMOVAL OF HARDWARE LEFT ANKLE EXOSTECTOMY LEFT ANKLE    A history and physical examination was performed in my office.  The patient was reexamined.  There have been no changes to this history and physical examination.  Rosetta Posner, DPM

## 2023-01-29 NOTE — Transfer of Care (Signed)
Immediate Anesthesia Transfer of Care Note  Patient: Breanna Ramos  Procedure(s) Performed: 40981 - ARTHROSCOPY DEBRIDEMENT EXTENSIVE (Left: Ankle) MINOR HARDWARE REMOVAL (Left: Ankle) 19147 - EXCISION BONE SPUR TIBIA (Left: Ankle)  Patient Location: PACU  Anesthesia Type: General  Level of Consciousness: awake, alert  and patient cooperative  Airway and Oxygen Therapy: Patient Spontanous Breathing and Patient connected to supplemental oxygen  Post-op Assessment: Post-op Vital signs reviewed, Patient's Cardiovascular Status Stable, Respiratory Function Stable, Patent Airway and No signs of Nausea or vomiting  Post-op Vital Signs: Reviewed and stable  Complications: No notable events documented.

## 2023-01-31 ENCOUNTER — Encounter: Payer: Self-pay | Admitting: Podiatry

## 2023-03-02 ENCOUNTER — Other Ambulatory Visit: Payer: Self-pay | Admitting: *Deleted

## 2023-03-02 DIAGNOSIS — D509 Iron deficiency anemia, unspecified: Secondary | ICD-10-CM

## 2023-03-02 NOTE — Progress Notes (Signed)
cbc

## 2023-03-03 ENCOUNTER — Inpatient Hospital Stay: Payer: No Typology Code available for payment source | Attending: Oncology

## 2023-03-03 DIAGNOSIS — D509 Iron deficiency anemia, unspecified: Secondary | ICD-10-CM | POA: Insufficient documentation

## 2023-03-03 LAB — CBC WITH DIFFERENTIAL/PLATELET
Abs Immature Granulocytes: 0.03 10*3/uL (ref 0.00–0.07)
Basophils Absolute: 0.1 10*3/uL (ref 0.0–0.1)
Basophils Relative: 1 %
Eosinophils Absolute: 0.5 10*3/uL (ref 0.0–0.5)
Eosinophils Relative: 5 %
HCT: 42.2 % (ref 36.0–46.0)
Hemoglobin: 14.1 g/dL (ref 12.0–15.0)
Immature Granulocytes: 0 %
Lymphocytes Relative: 23 %
Lymphs Abs: 2.1 10*3/uL (ref 0.7–4.0)
MCH: 29.9 pg (ref 26.0–34.0)
MCHC: 33.4 g/dL (ref 30.0–36.0)
MCV: 89.4 fL (ref 80.0–100.0)
Monocytes Absolute: 0.6 10*3/uL (ref 0.1–1.0)
Monocytes Relative: 6 %
Neutro Abs: 6 10*3/uL (ref 1.7–7.7)
Neutrophils Relative %: 65 %
Platelets: 405 10*3/uL — ABNORMAL HIGH (ref 150–400)
RBC: 4.72 MIL/uL (ref 3.87–5.11)
RDW: 13.1 % (ref 11.5–15.5)
WBC: 9.2 10*3/uL (ref 4.0–10.5)
nRBC: 0 % (ref 0.0–0.2)

## 2023-03-03 LAB — IRON AND TIBC
Iron: 61 ug/dL (ref 28–170)
Saturation Ratios: 16 % (ref 10.4–31.8)
TIBC: 395 ug/dL (ref 250–450)
UIBC: 334 ug/dL

## 2023-03-03 LAB — FERRITIN: Ferritin: 13 ng/mL (ref 11–307)

## 2023-03-05 ENCOUNTER — Inpatient Hospital Stay: Payer: No Typology Code available for payment source

## 2023-03-05 ENCOUNTER — Inpatient Hospital Stay: Payer: No Typology Code available for payment source | Admitting: Oncology

## 2023-03-05 DIAGNOSIS — D509 Iron deficiency anemia, unspecified: Secondary | ICD-10-CM | POA: Diagnosis not present

## 2023-03-05 NOTE — Progress Notes (Signed)
Patient is having some chronic fatigue, which she has a physical scheduled with her primary care provider soon so she is going to mention it to her. She is also in some severe back and ankle pain, which she just recently had ankle surgery.

## 2023-03-05 NOTE — Progress Notes (Signed)
[facility]  [phone] [phone]  ID: Breanna Ramos OB: 10-Sep-1970  MR#: [id number]  QIO#:[id number]  Patient Care Team: Enid Baas, MD as PCP - General (Internal Medicine) Jeralyn Ruths, MD as Consulting Physician (Hematology and Oncology)   CHIEF COMPLAINT: Iron deficiency anemia.  INTERVAL HISTORY: Patient returns to clinic today for repeat laboratory work, further evaluation, and consideration of additional IV Venofer.  She has chronic fatigue likely secondary to chronic pain and insomnia, but otherwise feels well.  She has no other neurologic complaints.  She denies any recent fevers or illnesses.  She has a good appetite and denies weight loss.  She has no chest pain, shortness of breath, cough, or hemoptysis.  She denies any nausea, vomiting, constipation, or diarrhea.  She has no urinary complaints.  Patient offers no further specific complaints today.  REVIEW OF SYSTEMS:   Review of Systems  Constitutional:  Positive for malaise/fatigue. Negative for fever and weight loss.  Respiratory: Negative.  Negative for cough, hemoptysis and shortness of breath.   Cardiovascular: Negative.  Negative for chest pain and leg swelling.  Gastrointestinal: Negative.  Negative for abdominal pain.  Genitourinary: Negative.  Negative for dysuria.  Musculoskeletal:  Positive for back pain and joint pain.  Skin: Negative.  Negative for rash.  Neurological:  Positive for sensory change. Negative for dizziness, tingling, speech change, focal weakness, weakness and headaches.  Psychiatric/Behavioral:  The patient is not nervous/anxious.     As per HPI. Otherwise, a complete review of systems is negative.  PAST MEDICAL HISTORY: Past Medical History:  Diagnosis Date   Acute focal neurologic deficit with complete resolution    Anxiety    Arthritis    Carpal tunnel syndrome    Cerebral venous sinus thrombosis    Cerebral venous sinus thrombosis     Chronic, continuous use of opioids    Depression    Diabetes mellitus without complication (HCC)    Fibromyalgia    GERD (gastroesophageal reflux disease)    Grade I diastolic dysfunction    H/O cardiac radiofrequency ablation    History of rectal bleeding 08/2020   Hypertension    Iron deficiency anemia    Kidney stone 11/23/2022   ? in which side" pt stated   Microprolactinoma (HCC) 2016   Nicotine dependence    Normocytic anemia    On long term drug therapy    OSA on CPAP    Pituitary tumor    PSVT (paroxysmal supraventricular tachycardia)    None since ablation   Pulsatile tinnitus    sporadic   Rectal bleeding    SBO (small bowel obstruction) (HCC)    Stroke (HCC) 07/02/2021   Some short term memory issues    PAST SURGICAL HISTORY: Past Surgical History:  Procedure Laterality Date   ANKLE ARTHROSCOPY Left 01/29/2023   Procedure: 29898 - ARTHROSCOPY DEBRIDEMENT EXTENSIVE;  Surgeon: Rosetta Posner, DPM;  Location: [facility];  Service: Orthopedics/Podiatry;  Laterality: Left;   BACK SURGERY     x 2   BONE EXCISION Left 01/29/2023   Procedure: 32440 - EXCISION BONE SPUR TIBIA;  Surgeon: Rosetta Posner, DPM;  Location: [facility];  Service: Orthopedics/Podiatry;  Laterality: Left;   CARDIAC ELECTROPHYSIOLOGY STUDY AND ABLATION     CESAREAN SECTION     CHOLECYSTECTOMY     COLONOSCOPY WITH PROPOFOL N/A 03/07/2020   Procedure: COLONOSCOPY WITH PROPOFOL;  Surgeon: Toney Reil, MD;  Location: [facility];  Service: Gastroenterology;  Laterality: N/A;  CYSTOSCOPY N/A 12/03/2022   Procedure: CYSTOSCOPY;  Surgeon: Conard Novak, MD;  Location: [facility];  Service: Gynecology;  Laterality: N/A;   FRACTURE SURGERY     Left Ankle   MINOR HARDWARE REMOVAL Left 01/29/2023   Procedure: MINOR HARDWARE REMOVAL;  Surgeon: Rosetta Posner, DPM;  Location: [facility];  Service: Orthopedics/Podiatry;  Laterality: Left;  REMOVED 3 SCREWS FROM LEFT ANKLE.   DISCARDED.   ROBOTIC ASSISTED LAPAROSCOPIC HYSTERECTOMY AND SALPINGECTOMY Bilateral 12/03/2022   Procedure: XI ROBOTIC ASSISTED LAPAROSCOPIC HYSTERECTOMY AND SALPINGECTOMY;  Surgeon: Conard Novak, MD;  Location: [facility];  Service: Gynecology;  Laterality: Bilateral;    FAMILY HISTORY: Family History  Problem Relation Age of Onset   Stroke Mother    Diabetes Mother    Heart disease Mother    Breast cancer Mother 22   Stroke Father     ADVANCED DIRECTIVES (Y/N):  N  HEALTH MAINTENANCE: Social History   Tobacco Use   Smoking status: Every Day    Current packs/day: 0.25    Average packs/day: 0.3 packs/day for 24.8 years (6.2 ttl pk-yrs)    Types: Cigarettes    Start date: 05/26/1998   Smokeless tobacco: Never  Vaping Use   Vaping status: Never Used  Substance Use Topics   Alcohol use: Yes    Comment: ~1 beer/week   Drug use: Never     Colonoscopy:  PAP:  Bone density:  Lipid panel:  Allergies  Allergen Reactions   Tape     Skin tear with tape removal after hysterectomy   Codeine Other (See Comments)    Cough syrup with codeine caused hyperactivity    Current Outpatient Medications  Medication Sig Dispense Refill   ascorbic acid (VITAMIN C) 500 MG tablet Take 500 mg by mouth daily.      atorvastatin (LIPITOR) 80 MG tablet Take 0.5 tablets (40 mg total) by mouth at bedtime.     cabergoline (DOSTINEX) 0.5 MG tablet Take 0.5 tablets (0.25 mg total) by mouth 2 (two) times a week. Tuesday/Saturday 13 tablet 2   celecoxib (CELEBREX) 200 MG capsule Take 200 mg by mouth at bedtime.      cyclobenzaprine (FLEXERIL) 10 MG tablet Take 10 mg by mouth 3 (three) times daily as needed for muscle spasms.     diltiazem (TIAZAC) 240 MG 24 hr capsule Take 240 mg by mouth daily.      esomeprazole (NEXIUM) 40 MG capsule Take 40 mg by mouth daily at 12 noon.     famciclovir (FAMVIR) 500 MG tablet Take 500 mg by mouth every 8 (eight) hours as needed.     FLUoxetine (PROZAC) 20 MG  tablet Take 60 mg by mouth daily.     gabapentin (NEURONTIN) 300 MG capsule Take 300 mg by mouth at bedtime.     lamoTRIgine (LAMICTAL) 200 MG tablet Take 200 mg by mouth daily.     lamoTRIgine (LAMICTAL) 25 MG tablet Take 50 mg by mouth at bedtime.     loratadine (CLARITIN) 10 MG tablet Take 10 mg by mouth daily.      Magnesium Oxide 500 MG TABS Take 1 tablet by mouth at bedtime.     metFORMIN (GLUCOPHAGE) 500 MG tablet Take 500-1,000 mg by mouth 2 (two) times daily with a meal. 100 mg qam and 500 mg qpm     Multiple Vitamin (MULTI-VITAMIN) tablet Take 1 tablet by mouth at bedtime.     ondansetron (ZOFRAN) 8 MG tablet Take 8 mg by mouth  every 8 (eight) hours as needed for nausea or vomiting.     ondansetron (ZOFRAN-ODT) 8 MG disintegrating tablet Take 8 mg by mouth every 8 (eight) hours as needed for nausea or vomiting.     oxyCODONE-acetaminophen (PERCOCET) 10-325 MG tablet Take 1 tablet by mouth every 6 (six) hours as needed for pain.      propranolol (INDERAL) 10 MG tablet Take 10 mg by mouth 2 (two) times daily.     propranolol (INDERAL) 20 MG tablet Take 20 mg by mouth 2 (two) times daily.     traZODone (DESYREL) 100 MG tablet Take 100 mg by mouth at bedtime.     No current facility-administered medications for this visit.    OBJECTIVE: There were no vitals filed for this visit.     There is no height or weight on file to calculate BMI.    ECOG FS:0 - Asymptomatic  General: Well-developed, well-nourished, no acute distress. Eyes: Pink conjunctiva, anicteric sclera. HEENT: Normocephalic, moist mucous membranes. Lungs: No audible wheezing or coughing. Heart: Regular rate and rhythm. Abdomen: Soft, nontender, no obvious distention. Musculoskeletal: No edema, cyanosis, or clubbing. Neuro: Alert, answering all questions appropriately. Cranial nerves grossly intact. Skin: No rashes or petechiae noted. Psych: Normal affect.  LAB RESULTS:  Lab Results  Component Value Date   NA  137 11/23/2022   K 4.1 11/23/2022   CL 102 11/23/2022   CO2 26 11/23/2022   GLUCOSE 93 11/23/2022   BUN 18 11/23/2022   CREATININE 0.55 11/23/2022   CALCIUM 8.8 (L) 11/23/2022   PROT 6.7 11/23/2022   ALBUMIN 4.0 11/23/2022   AST 37 11/23/2022   ALT 49 (H) 11/23/2022   ALKPHOS 94 11/23/2022   BILITOT 0.5 11/23/2022   GFRNONAA >60 11/23/2022   GFRAA >60 02/15/2020    Lab Results  Component Value Date   WBC 9.2 03/03/2023   NEUTROABS 6.0 03/03/2023   HGB 14.1 03/03/2023   HCT 42.2 03/03/2023   MCV 89.4 03/03/2023   PLT 405 (H) 03/03/2023   Lab Results  Component Value Date   IRON 61 03/03/2023   TIBC 395 03/03/2023   IRONPCTSAT 16 03/03/2023   Lab Results  Component Value Date   FERRITIN 13 03/03/2023     STUDIES: No results found.  ASSESSMENT: Iron deficiency anemia.  PLAN:    Iron deficiency anemia: Resolved.  Patient's hemoglobin and iron panel are within normal limits.  She does not require additional treatment today.  She last received IV Venofer on November 04, 2022.  No further intervention is needed.  Return to clinic in 6 months with repeat laboratory work, further evaluation, and consideration of additional treatment if needed.   Cerebral sinus thrombosis: Recent CT of the head was reported as unchanged.  Previously, CT venogram of the head on July 02, 2021 reviewed independently with a 2.5 cm filling defect in the right transverse sinus consistent with thrombus, although unclear if this was acute or chronic.  Full hypercoagulable workup is negative.  Recommendations for an unprovoked sinus clot is 12 months of treatment and then discontinue.  Patient expressed understanding that while recurrence is unlikely she has approximately 2 to 7% chance of this happening.  No further intervention is needed.  Patient had DVT or other blood clot would recommend lifelong treatment at that time.   Fatigue: Likely secondary to chronic pain from degenerative back disease as  well as left ankle surgery.  Patient reports she has difficulty walking 10 or 15 feet at  a time without resting.  Her chronic insomnia is also likely contributing.   Patient expressed understanding and was in agreement with this plan. She also understands that She can call clinic at any time with any questions, concerns, or complaints.    Jeralyn Ruths, MD   03/05/2023 4:45 PM

## 2023-03-23 ENCOUNTER — Other Ambulatory Visit: Payer: Self-pay | Admitting: Internal Medicine

## 2023-03-23 DIAGNOSIS — Z1231 Encounter for screening mammogram for malignant neoplasm of breast: Secondary | ICD-10-CM

## 2023-04-07 ENCOUNTER — Encounter: Payer: Self-pay | Admitting: Internal Medicine

## 2023-04-13 ENCOUNTER — Ambulatory Visit: Payer: No Typology Code available for payment source | Admitting: Internal Medicine

## 2023-05-05 ENCOUNTER — Other Ambulatory Visit: Payer: No Typology Code available for payment source

## 2023-06-11 ENCOUNTER — Ambulatory Visit: Payer: No Typology Code available for payment source | Admitting: Internal Medicine

## 2023-06-18 ENCOUNTER — Emergency Department
Admission: EM | Admit: 2023-06-18 | Discharge: 2023-06-18 | Disposition: A | Discharge status: Home / Self Care | Payer: No Typology Code available for payment source | Attending: Emergency Medicine | Admitting: Emergency Medicine

## 2023-06-18 ENCOUNTER — Other Ambulatory Visit: Payer: Self-pay

## 2023-06-18 ENCOUNTER — Emergency Department: Payer: No Typology Code available for payment source

## 2023-06-18 DIAGNOSIS — M545 Low back pain, unspecified: Secondary | ICD-10-CM | POA: Insufficient documentation

## 2023-06-18 DIAGNOSIS — M546 Pain in thoracic spine: Secondary | ICD-10-CM | POA: Diagnosis not present

## 2023-06-18 DIAGNOSIS — X500XXA Overexertion from strenuous movement or load, initial encounter: Secondary | ICD-10-CM | POA: Diagnosis not present

## 2023-06-18 MED ORDER — FENTANYL CITRATE PF 50 MCG/ML IJ SOSY
50.0000 ug | PREFILLED_SYRINGE | Freq: Once | INTRAMUSCULAR | Status: AC
  Administered 2023-06-18: 50 ug via INTRAVENOUS
  Filled 2023-06-18: qty 1

## 2023-06-18 MED ORDER — PREDNISONE 10 MG (21) PO TBPK: ORAL_TABLET | ORAL | 0 refills | Status: DC

## 2023-06-18 MED ORDER — METHOCARBAMOL 1000 MG/10ML IJ SOLN
500.0000 mg | Freq: Once | INTRAMUSCULAR | Status: AC
  Administered 2023-06-18: 500 mg via INTRAVENOUS
  Filled 2023-06-18: qty 5

## 2023-06-18 MED ORDER — HYDROCODONE-ACETAMINOPHEN 5-325 MG PO TABS
2.0000 | ORAL_TABLET | Freq: Once | ORAL | Status: AC
  Administered 2023-06-18: 2 via ORAL
  Filled 2023-06-18: qty 2

## 2023-06-18 MED ORDER — DEXAMETHASONE SODIUM PHOSPHATE 10 MG/ML IJ SOLN
10.0000 mg | Freq: Once | INTRAMUSCULAR | Status: AC
  Administered 2023-06-18: 10 mg via INTRAVENOUS
  Filled 2023-06-18: qty 1

## 2023-06-18 NOTE — ED Triage Notes (Signed)
Pt to ED for back pain after vacuuming yesterday. Hx back surgery

## 2023-06-18 NOTE — ED Provider Notes (Signed)
[place] EMERGENCY DEPARTMENT AT [facility] Provider Note   CSN: [id number] Arrival date & time: 06/18/23  1133     History  Chief Complaint  Patient presents with   Back Pain    Breanna Ramos is a 53 y.o. female.  Patient with a history of back pain here for low back pain. Pain since yesterday while she was vacuuming she felt sharp ain in mid/low back. Worse with any movement, has not improved with her pain meds at home. No numbness, weakness, bowel or bladder dysfunction. No fevers or chills. Urgent care referred her to ED for likely CT imaging due to concern for bulged disc  The history is provided by the patient.  Back Pain Location:  Thoracic spine and lumbar spine Quality:  Stabbing Radiates to:  Does not radiate Pain severity:  Severe Onset quality:  Sudden Duration:  1 day Timing:  Constant Progression:  Unchanged Chronicity:  Recurrent Context: lifting heavy objects   Relieved by:  Nothing Worsened by:  Standing Associated symptoms: no chest pain, no fever, no numbness and no weakness        Home Medications Prior to Admission medications   Medication Sig Start Date End Date Taking? Authorizing Provider  predniSONE (STERAPRED UNI-PAK 21 TAB) 10 MG (21) TBPK tablet Take 6 tablets first day, 5 on second day, 4 on third day, 3 on forth day, 2 on fifth day, 1 on last day 06/18/23  Yes Ashley Jacobs, Kellyann Ordway, PA-C  ascorbic acid (VITAMIN C) 500 MG tablet Take 500 mg by mouth daily.     [provider]  atorvastatin (LIPITOR) 80 MG tablet Take 0.5 tablets (40 mg total) by mouth at bedtime. 12/03/22   Conard Novak, MD  cabergoline (DOSTINEX) 0.5 MG tablet Take 0.5 tablets (0.25 mg total) by mouth 2 (two) times a week. Tuesday/Saturday 10/09/22   Shamleffer, Konrad Dolores, MD  celecoxib (CELEBREX) 200 MG capsule Take 200 mg by mouth at bedtime.     [provider]  cyclobenzaprine (FLEXERIL) 10 MG tablet Take 10 mg by mouth 3 (three) times daily as  needed for muscle spasms. 03/01/16   [provider]  diltiazem (TIAZAC) 240 MG 24 hr capsule Take 240 mg by mouth daily.     [provider]  esomeprazole (NEXIUM) 40 MG capsule Take 40 mg by mouth daily at 12 noon.    [provider]  famciclovir (FAMVIR) 500 MG tablet Take 500 mg by mouth every 8 (eight) hours as needed.    [provider]  FLUoxetine (PROZAC) 20 MG tablet Take 60 mg by mouth daily.    [provider]  gabapentin (NEURONTIN) 300 MG capsule Take 300 mg by mouth at bedtime. 09/26/19   [provider]  lamoTRIgine (LAMICTAL) 200 MG tablet Take 200 mg by mouth daily.    [provider]  lamoTRIgine (LAMICTAL) 25 MG tablet Take 50 mg by mouth at bedtime.    [provider]  loratadine (CLARITIN) 10 MG tablet Take 10 mg by mouth daily.     [provider]  Magnesium Oxide 500 MG TABS Take 1 tablet by mouth at bedtime.    [provider]  metFORMIN (GLUCOPHAGE) 500 MG tablet Take 500-1,000 mg by mouth 2 (two) times daily with a meal. 100 mg qam and 500 mg qpm    [provider]  Multiple Vitamin (MULTI-VITAMIN) tablet Take 1 tablet by mouth at bedtime.    [provider]  ondansetron [facility])  8 MG tablet Take 8 mg by mouth every 8 (eight) hours as needed for nausea or vomiting.    [provider]  ondansetron (ZOFRAN-ODT) 8 MG disintegrating tablet Take 8 mg by mouth every 8 (eight) hours as needed for nausea or vomiting.    [provider]  oxyCODONE-acetaminophen (PERCOCET) 10-325 MG tablet Take 1 tablet by mouth every 6 (six) hours as needed for pain.  03/02/16   [provider]  propranolol (INDERAL) 10 MG tablet Take 10 mg by mouth 2 (two) times daily.    [provider]  propranolol (INDERAL) 20 MG tablet Take 20 mg by mouth 2 (two) times daily.    [provider]  traZODone (DESYREL) 100 MG tablet Take 100 mg by mouth at bedtime.     [provider]      Allergies    Tape and Codeine    Review of Systems   Review of Systems  Constitutional:  Negative for chills and fever.  Respiratory:  Negative for shortness of breath.   Cardiovascular:  Negative for chest pain.  Musculoskeletal:  Positive for back pain.  Neurological:  Negative for weakness and numbness.    Physical Exam Updated Vital Signs BP (!) 136/92 (BP Location: Left Arm)   Pulse 84   Temp 98.1 F (36.7 C) (Oral)   Resp 19   Ht 5' 6.5" (1.689 m)   Wt 92.9 kg   LMP 11/14/2022 (Exact Date)   SpO2 97%   BMI 32.56 kg/m  Physical Exam Vitals and nursing note reviewed.  Constitutional:      General: She is not in acute distress.    Appearance: She is well-developed.  HENT:     Head: Normocephalic and atraumatic.  Eyes:     Conjunctiva/sclera: Conjunctivae normal.  Cardiovascular:     Rate and Rhythm: Normal rate and regular rhythm.     Heart sounds: No murmur heard. Pulmonary:     Effort: Pulmonary effort is normal. No respiratory distress.     Breath sounds: Normal breath sounds.  Abdominal:     Palpations: Abdomen is soft.     Tenderness: There is no abdominal tenderness.  Musculoskeletal:        General: No swelling.     Cervical back: Neck supple.     Comments: Mild thoracic and lumbar tenderness, associated paraspinal spasm, no rash, lower ext strength and sensation intact.   Skin:    General: Skin is warm and dry.     Capillary Refill: Capillary refill takes less than 2 seconds.  Neurological:     Mental Status: She is alert.  Psychiatric:        Mood and Affect: Mood normal.     ED Results / Procedures / Treatments   Labs (all labs ordered are listed, but only abnormal results are displayed) Labs Reviewed - No data to display  EKG None  Radiology CT Lumbar Spine Wo Contrast Result Date: 06/18/2023 CLINICAL DATA:  Low back pain.  Pain after vacuuming. EXAM: CT LUMBAR SPINE WITHOUT CONTRAST TECHNIQUE:  Multidetector CT imaging of the lumbar spine was performed without intravenous contrast administration. Multiplanar CT image reconstructions were also generated. RADIATION DOSE REDUCTION: This exam was performed according to the departmental dose-optimization program which includes automated exposure control, adjustment of the mA and/or kV according to patient size and/or use of iterative reconstruction technique. COMPARISON:  MRI lumbar spine 03/14/2022 FINDINGS: Segmentation: 5 non rib-bearing lumbar type vertebral bodies are present. The lowest fully  formed vertebral body is L5. Alignment: No significant listhesis is present. Straightening of the normal lumbar lordosis is again noted. Rightward curvature of the thoracolumbar spine is centered at L1. Compensatory leftward curvature is present at L4-5 and L5-S1. Vertebrae: Superior endplate Schmorl's nodes are present at L1. Schmorl's node is present at L1-2. Vertebral body heights are normal. No focal osseous lesions are present. The right L4 pedicle screw terminates lateral to the vertebral body. Paraspinal and other soft tissues: A 12 mm nonobstructing stone is present at the lower pole of the left kidney. A punctate scratched at 2 punctate nonobstructing stones are present at the upper pole of the left kidney. No other focal solid organ lesions are present. No significant adenopathy is present. Disc levels: T12-L1: Insert normal disc. L1-2: Normal disc signal and height is present. No focal protrusion or stenosis is present. L2-3: Mild disc bulge in is asymmetric to the left. Asymmetric left-sided facet hypertrophy is present. No significant stenosis is present. L3-4: Patient is status post PLIF. Right laminectomy and foraminotomy is noted. No residual or recurrent stenosis is present. Pedicle screw and rod fixation is intact. No bridging bone is present across the disc space. L4-5: Mild disc bulging and facet hypertrophy is present bilaterally. No significant  stenosis or change is present. L5-S1: Mild facet hypertrophy is present the right. No focal disc protrusion or stenosis is present. IMPRESSION: 1. Status post PLIF at L3-4 without residual or recurrent stenosis. 2. The right L4 pedicle screw terminates lateral to the vertebral body. 3. No significant bridging bone is present across the L3-4 disc. The fusion is incomplete. 4. Mild disc bulging and facet hypertrophy at L2-3 and L4-5 without significant stenosis. 5. Mild facet hypertrophy at L5-S1 without significant stenosis. 6. 12 mm nonobstructing stone at the lower pole of the left kidney. 7. Two punctate nonobstructing stones at the upper pole of the left kidney. Electronically Signed   By: Marin Roberts M.D.   On: 06/18/2023 14:59   CT Thoracic Spine Wo Contrast Result Date: 06/18/2023 CLINICAL DATA:  Mid back pain.  Prior compression fracture. EXAM: CT THORACIC SPINE WITHOUT CONTRAST TECHNIQUE: Multidetector CT images of the thoracic were obtained using the standard protocol without intravenous contrast. RADIATION DOSE REDUCTION: This exam was performed according to the departmental dose-optimization program which includes automated exposure control, adjustment of the mA and/or kV according to patient size and/or use of iterative reconstruction technique. COMPARISON:  Two-view chest x-ray 10/31/2022 FINDINGS: Alignment: No significant listhesis is present. Thoracic kyphosis is within normal limits. Vertebrae: Vertebral body heights are maintained. No acute or healing fractures are present. Asymmetric endplate sclerotic changes are present on the right at T9-10 with focal leftward curvature at this level. Schmorl's nodes are present at T10-11 and T11-12. Paraspinal and other soft tissues: Paraspinous soft tissues are within normal limits. Visualized lung fields are clear. The mediastinum is unremarkable. A 3.5 mm nonobstructing stone is present in the left kidney. The visualized abdomen is otherwise  unremarkable. Disc levels: No significant focal disc protrusion or stenosis is present in the thoracic spine. IMPRESSION: 1. No acute or healing fractures. 2. Asymmetric endplate sclerotic changes on the right at T9-10 with focal leftward curvature at this level. This likely reflects sequela of prior trauma. 3. Schmorl's nodes at T10-11 and T11-12. 4. No significant focal disc protrusion or stenosis in the thoracic spine. 5. 3.5 mm nonobstructing stone in the left kidney. Electronically Signed   By: Marin Roberts M.D.   On: 06/18/2023  14:51    Procedures Procedures    Medications Ordered in ED Medications  dexamethasone (DECADRON) injection 10 mg (10 mg Intravenous Given 06/18/23 1257)  fentaNYL (SUBLIMAZE) injection 50 mcg (50 mcg Intravenous Given 06/18/23 1257)  methocarbamol (ROBAXIN) injection 500 mg (500 mg Intravenous Given 06/18/23 1332)  HYDROcodone-acetaminophen (NORCO/VICODIN) 5-325 MG per tablet 2 tablet (2 tablets Oral Given 06/18/23 1451)    ED Course/ Medical Decision Making/ A&P                                 Medical Decision Making Patient with history of back pain and multiple surgeries having pain after injury vacuuming yesterday. No red flag symptoms such as numbness, weakness, bowel or bladder dysfunction. She does have tenderness of lower thoracic and upper lumbar spine. Medicate and check CT.  CTs are nonacute.  She does have significant hardware as well as disc bulging.  Given no significant acute findings no red flag symptoms I will recommend outpatient treatment she already has Percocet and Flexeril.  I will add a prednisone Dosepak due to her colitis she cannot take NSAIDs.  She has a primary care follow-up tomorrow thankfully and will follow-up closely with neurosurgery.  Given return precautions.  Amount and/or Complexity of Data Reviewed Radiology: ordered.  Risk Prescription drug management.           Final Clinical Impression(s) / ED  Diagnoses Final diagnoses:  Acute low back pain, unspecified back pain laterality, unspecified whether sciatica present    Rx / DC Orders ED Discharge Orders          Ordered    predniSONE (STERAPRED UNI-PAK 21 TAB) 10 MG (21) TBPK tablet        06/18/23 1520              Christen Bame, PA-C 06/18/23 1522    Concha Se, MD 06/19/23 [phone]

## 2023-06-18 NOTE — ED Triage Notes (Signed)
First Nurse Note: Patient to ED from [facility] for mid to lower back pain. KC reports she took pain meds at home PTA.

## 2023-06-18 NOTE — Discharge Instructions (Signed)
Continue your pain medication and muscle relaxers.  Take steroid pack as directed. Follow-up with primary care as planned tomorrow and neurosurgery as soon as possible.  Return here for new or worse symptoms including numbness, weakness, bowel or bladder dysfunction.

## 2023-06-26 NOTE — Progress Notes (Unsigned)
Referring Physician:  Enid Baas, MD [address] [place],  [place] [address]  Primary Physician:  Enid Baas, MD  History of Present Illness: 06/29/2023 Ms. Breanna Ramos has a history of HTN, PSVT, OSA with CPAP, DM, chronic pain, and TIA with cerebral venous sinus thrombosis.   Seen in ED on 06/18/23 for LBP that started after vacuuming. History of fusion L3-L4 in 2019.  She has constant LBP, left > right, with right posterior leg pain to her mid thigh. No left leg pain. Pain is worse with standing, walking, and any activity. She has relief with her medications. She's had numbness in both legs since her surgery. No new tingling in her legs. She had weakness in legs last week but feels like it is improving.   She sees Probation officer in [place]. She is on percocet, neurontin, and flexeril. She was recently started on butrans patch. Given steroid dose pack from ED on 06/18/23.   She smokes 5 cigarettes a day x 20 years.   Bowel/Bladder Dysfunction: none. She has known UC. She has bowel urgency.    Conservative measures:  Physical therapy: PT at [facility] in 2024 about 5 visits  Multimodal medical therapy including regular antiinflammatories: flexeril, prednisone, Celebrex, Percocet, Trazodone, Gabapentin (sees [facility] in [place]). Injections:  caudal ESI 06/06/22 with no relief   Past Surgery:   Lumbar fusion L3-L4 2019 Cervical spine surgery 2013  The symptoms are causing a significant impact on the patient's life.   Review of Systems:  A 10 point review of systems is negative, except for the pertinent positives and negatives detailed in the HPI.  Past Medical History: Past Medical History:  Diagnosis Date   Acute focal neurologic deficit with complete resolution    Anxiety    Arthritis    Carpal tunnel syndrome    Cerebral venous sinus thrombosis    Cerebral venous sinus thrombosis    Chronic, continuous use of opioids     Depression    Diabetes mellitus without complication (HCC)    Fibromyalgia    GERD (gastroesophageal reflux disease)    Grade I diastolic dysfunction    H/O cardiac radiofrequency ablation    History of rectal bleeding 08/2020   Hypertension    Iron deficiency anemia    Kidney stone 11/23/2022   ? in which side" pt stated   Microprolactinoma (HCC) 2016   Nicotine dependence    Normocytic anemia    On long term drug therapy    OSA on CPAP    Pituitary tumor    PSVT (paroxysmal supraventricular tachycardia) (HCC)    None since ablation   Pulsatile tinnitus    sporadic   Rectal bleeding    SBO (small bowel obstruction) (HCC)    Stroke (HCC) 07/02/2021   Some short term memory issues    Past Surgical History: Past Surgical History:  Procedure Laterality Date   ANKLE ARTHROSCOPY Left 01/29/2023   Procedure: 29898 - ARTHROSCOPY DEBRIDEMENT EXTENSIVE;  Surgeon: Rosetta Posner, DPM;  Location: [facility];  Service: Orthopedics/Podiatry;  Laterality: Left;   BACK SURGERY     x 2   BONE EXCISION Left 01/29/2023   Procedure: 21308 - EXCISION BONE SPUR TIBIA;  Surgeon: Rosetta Posner, DPM;  Location: [facility];  Service: Orthopedics/Podiatry;  Laterality: Left;   CARDIAC ELECTROPHYSIOLOGY STUDY AND ABLATION     CESAREAN SECTION     CHOLECYSTECTOMY     COLONOSCOPY WITH PROPOFOL N/A 03/07/2020   Procedure:  COLONOSCOPY WITH PROPOFOL;  Surgeon: Toney Reil, MD;  Location: [facility] END[facility]COPY;  Service: Gastroenterology;  Laterality: N/A;   CYSTOSCOPY N/A 12/03/2022   Procedure: CYSTOSCOPY;  Surgeon: Conard Novak, MD;  Location: [facility] ORS;  Service: Gynecology;  Laterality: N/A;   FRACTURE SURGERY     Left Ankle   MINOR HARDWARE REMOVAL Left 01/29/2023   Procedure: MINOR HARDWARE REMOVAL;  Surgeon: Rosetta Posner, DPM;  Location: [facility];  Service: Orthopedics/Podiatry;  Laterality: Left;  REMOVED 3 SCREWS FROM LEFT ANKLE.  DISCARDED.   ROBOTIC ASSISTED  LAPAROSCOPIC HYSTERECTOMY AND SALPINGECTOMY Bilateral 12/03/2022   Procedure: XI ROBOTIC ASSISTED LAPAROSCOPIC HYSTERECTOMY AND SALPINGECTOMY;  Surgeon: Conard Novak, MD;  Location: [facility] ORS;  Service: Gynecology;  Laterality: Bilateral;    Allergies: Allergies as of 06/29/2023 - Review Complete 06/18/2023  Allergen Reaction Noted   Tape  01/19/2023   Codeine Other (See Comments) 03/07/2019    Medications: Outpatient Encounter Medications as of 06/29/2023  Medication Sig   ascorbic acid (VITAMIN C) 500 MG tablet Take 500 mg by mouth daily.    atorvastatin (LIPITOR) 80 MG tablet Take 0.5 tablets (40 mg total) by mouth at bedtime.   cabergoline (DOSTINEX) 0.5 MG tablet Take 0.5 tablets (0.25 mg total) by mouth 2 (two) times a week. Tuesday/Saturday   celecoxib (CELEBREX) 200 MG capsule Take 200 mg by mouth at bedtime.    cyclobenzaprine (FLEXERIL) 10 MG tablet Take 10 mg by mouth 3 (three) times daily as needed for muscle spasms.   diltiazem (TIAZAC) 240 MG 24 hr capsule Take 240 mg by mouth daily.    esomeprazole (NEXIUM) 40 MG capsule Take 40 mg by mouth daily at 12 noon.   famciclovir (FAMVIR) 500 MG tablet Take 500 mg by mouth every 8 (eight) hours as needed.   FLUoxetine (PROZAC) 20 MG tablet Take 60 mg by mouth daily.   gabapentin (NEURONTIN) 300 MG capsule Take 300 mg by mouth at bedtime.   lamoTRIgine (LAMICTAL) 200 MG tablet Take 200 mg by mouth daily.   lamoTRIgine (LAMICTAL) 25 MG tablet Take 50 mg by mouth at bedtime.   loratadine (CLARITIN) 10 MG tablet Take 10 mg by mouth daily.    Magnesium Oxide 500 MG TABS Take 1 tablet by mouth at bedtime.   metFORMIN (GLUCOPHAGE) 500 MG tablet Take 500-1,000 mg by mouth 2 (two) times daily with a meal. 100 mg qam and 500 mg qpm   Multiple Vitamin (MULTI-VITAMIN) tablet Take 1 tablet by mouth at bedtime.   ondansetron (ZOFRAN) 8 MG tablet Take 8 mg by mouth every 8 (eight) hours as needed for nausea or vomiting.   ondansetron  (ZOFRAN-ODT) 8 MG disintegrating tablet Take 8 mg by mouth every 8 (eight) hours as needed for nausea or vomiting.   oxyCODONE-acetaminophen (PERCOCET) 10-325 MG tablet Take 1 tablet by mouth every 6 (six) hours as needed for pain.    predniSONE (STERAPRED UNI-PAK 21 TAB) 10 MG (21) TBPK tablet Take 6 tablets first day, 5 on second day, 4 on third day, 3 on forth day, 2 on fifth day, 1 on last day   propranolol (INDERAL) 10 MG tablet Take 10 mg by mouth 2 (two) times daily.   propranolol (INDERAL) 20 MG tablet Take 20 mg by mouth 2 (two) times daily.   traZODone (DESYREL) 100 MG tablet Take 100 mg by mouth at bedtime.   No facility-administered encounter medications on file as of 06/29/2023.    Social History: Social History  Tobacco Use   Smoking status: Every Day    Current packs/day: 0.25    Average packs/day: 0.3 packs/day for 25.1 years (6.3 ttl pk-yrs)    Types: Cigarettes    Start date: 05/26/1998   Smokeless tobacco: Never  Vaping Use   Vaping status: Never Used  Substance Use Topics   Alcohol use: Yes    Comment: ~1 beer/week   Drug use: Never    Family Medical History: Family History  Problem Relation Age of Onset   Stroke Mother    Diabetes Mother    Heart disease Mother    Breast cancer Mother 59   Stroke Father     Physical Examination: There were no vitals filed for this visit.  General: Patient is well developed, well nourished, calm, collected, and in no apparent distress. Attention to examination is appropriate.  Respiratory: Patient is breathing without any difficulty.   NEUROLOGICAL:     Awake, alert, oriented to person, place, and time.  Speech is clear and fluent. Fund of knowledge is appropriate.   Cranial Nerves: Pupils equal round and reactive to light.  Facial tone is symmetric.    Well healed lumbar incision. Mild diffuse lower lumbar tenderness.   No abnormal lesions on exposed skin.   Strength: Side Biceps Triceps Deltoid Interossei  Grip Wrist Ext. Wrist Flex.  R 5 5 5 5 5 5 5   L 5 5 5 5 5 5 5    Side Iliopsoas Quads Hamstring PF DF EHL  R 5 5 5 5 5 5   L 5 5 5 5 5 5    Reflexes are 2+ and symmetric at the biceps, brachioradialis, patella and achilles.   Left achilles not tested due to ankle brace.   Hoffman's is absent.  Clonus is not present.   Bilateral upper and lower extremity sensation is intact to light touch.     Slow gait with cane.   She has brace on left ankle.   Medical Decision Making  Imaging: Lumbar CT scan dated 06/18/23:  FINDINGS: Segmentation: 5 non rib-bearing lumbar type vertebral bodies are present. The lowest fully formed vertebral body is L5.   Alignment: No significant listhesis is present. Straightening of the normal lumbar lordosis is again noted. Rightward curvature of the thoracolumbar spine is centered at L1. Compensatory leftward curvature is present at L4-5 and L5-S1.   Vertebrae: Superior endplate Schmorl's nodes are present at L1. Schmorl's node is present at L1-2. Vertebral body heights are normal. No focal osseous lesions are present. The right L4 pedicle screw terminates lateral to the vertebral body.   Paraspinal and other soft tissues: A 12 mm nonobstructing stone is present at the lower pole of the left kidney. A punctate scratched at 2 punctate nonobstructing stones are present at the upper pole of the left kidney. No other focal solid organ lesions are present. No significant adenopathy is present.   Disc levels: T12-L1: Insert normal disc.   L1-2: Normal disc signal and height is present. No focal protrusion or stenosis is present.   L2-3: Mild disc bulge in is asymmetric to the left. Asymmetric left-sided facet hypertrophy is present. No significant stenosis is present.   L3-4: Patient is status post PLIF. Right laminectomy and foraminotomy is noted. No residual or recurrent stenosis is present. Pedicle screw and rod fixation is intact. No bridging bone  is present across the disc space.   L4-5: Mild disc bulging and facet hypertrophy is present bilaterally. No significant stenosis or change is  present.   L5-S1: Mild facet hypertrophy is present the right. No focal disc protrusion or stenosis is present.   IMPRESSION: 1. Status post PLIF at L3-4 without residual or recurrent stenosis. 2. The right L4 pedicle screw terminates lateral to the vertebral body. 3. No significant bridging bone is present across the L3-4 disc. The fusion is incomplete. 4. Mild disc bulging and facet hypertrophy at L2-3 and L4-5 without significant stenosis. 5. Mild facet hypertrophy at L5-S1 without significant stenosis. 6. 12 mm nonobstructing stone at the lower pole of the left kidney. 7. Two punctate nonobstructing stones at the upper pole of the left kidney.     Electronically Signed   By: Marin Roberts M.D.   On: 06/18/2023 14:59  I have personally reviewed the images and agree with the above interpretation.  Assessment and Plan: Ms. Jolin has a history of fusion L3-L4 in 2019.  She has constant LBP, left > right, with right posterior leg pain to her mid thigh. No left leg pain. Pain is worse with standing, walking, and any activity. She's had numbness in both legs since her surgery. No new tingling in her legs. She had weakness in legs last week but feels like it is improving.   Fusion L3-L4 as above with no obvious bridging bone on CT scan. She has multilevel lumbar spondylosis and facet hypertrophy. No compression seen on CT.   CT also shows left kidney stones.   LBP may be from pseudoarthrosis at L3-L4 along with underlying spondylosis. Kidney stones may be contributing as well.   Treatment options discussed with patient and following plan made:   - Recommend PT for lumbar spine. She wants to go to [facility]. Message to her PCP to see if she can order.  - Follow up with urology as scheduled.  - Continue with current medications from  [facility] including percocet, butrans patch, flexeril, and neurontin.  - If no improvement with PT, she may be candidate for surgery. See below.  - Follow up with me in 6-8 weeks and prn.    Imaging reviewed with Dr. Katrinka Blazing after her visit. He agrees that she has pseudoarthrosis at L4-L5. He recommends lumbar xrays with flex/ext at her follow up. Depending on this, would also need scoliosis xrays and updated lumbar MRI prior to discussion of surgery. He would want to discuss options with her prior to proceeding with SCS.   BP was elevated, it was 140/90. No symptoms of chest pain, shortness of breath, blurry vision, or headaches. She checks BP at home and it generally runs lower. Will recheck at home and call PCP if not improved. If she develops CP, SOB, blurry vision, or headaches, then she will go to ED.     I spent a total of 30 minutes in face-to-face and non-face-to-face activities related to this patient's care today including review of outside records, review of imaging, review of symptoms, physical exam, discussion of differential diagnosis, discussion of treatment options, and documentation.   Thank you for involving me in the care of this patient.   Drake Leach PA-C Dept. of Neurosurgery

## 2023-06-29 ENCOUNTER — Ambulatory Visit: Payer: No Typology Code available for payment source | Admitting: Orthopedic Surgery

## 2023-06-29 ENCOUNTER — Encounter: Payer: Self-pay | Admitting: Orthopedic Surgery

## 2023-06-29 VITALS — BP 140/90 | Ht 66.5 in | Wt 207.0 lb

## 2023-06-29 DIAGNOSIS — M960 Pseudarthrosis after fusion or arthrodesis: Secondary | ICD-10-CM

## 2023-06-29 DIAGNOSIS — M47816 Spondylosis without myelopathy or radiculopathy, lumbar region: Secondary | ICD-10-CM

## 2023-06-29 DIAGNOSIS — S32009K Unspecified fracture of unspecified lumbar vertebra, subsequent encounter for fracture with nonunion: Secondary | ICD-10-CM

## 2023-06-29 DIAGNOSIS — N200 Calculus of kidney: Secondary | ICD-10-CM

## 2023-06-29 NOTE — Patient Instructions (Signed)
It was so nice to see you today. Thank you so much for coming in.    The CT scan shows that fusion did not heal. This may be causing your lower back pain, however the kidney stones may be contributing as well. You also have some wear and tear in your back (arthritis).   I agree with seeing urology for the kidney stones.   I want you to start PT for your lower back. I sent a message to Dr. Nemiah Commander to see if she would order PT for you at the [facility]. They should call you or you can call them at [phone].   Continue with [facility] for your medications. Call and let them know how you are doing on the butrans patch.   Your blood pressure was slightly elevated today, it was 140/90. I want you to recheck it at home and follow up with your PCP if it remains high. If you have any chest pain, shortness of breath, blurry vision, or headaches then you need to go to ED.    I will see you back in 6-8 weeks. Please do not hesitate to call if you have any questions or concerns. You can also message me in MyChart.   Drake Leach PA-C [phone]     The physicians and staff at [facility] at [facility] are committed to providing excellent care. You may receive a survey asking for feedback about your experience at our office. We value you your feedback and appreciate you taking the time to to fill it out. The [facility] leadership team is also available to discuss your experience in person, feel free to contact us [phone].

## 2023-07-21 ENCOUNTER — Ambulatory Visit: Payer: Self-pay | Admitting: Urology

## 2023-08-18 ENCOUNTER — Ambulatory Visit
Admission: RE | Admit: 2023-08-18 | Discharge: 2023-08-18 | Disposition: A | Discharge status: Home / Self Care | Source: Ambulatory Visit | Attending: Urology | Admitting: Urology

## 2023-08-18 ENCOUNTER — Encounter: Payer: Self-pay | Admitting: Urology

## 2023-08-18 ENCOUNTER — Ambulatory Visit: Payer: No Typology Code available for payment source | Admitting: Urology

## 2023-08-18 VITALS — BP 117/83 | Ht 66.5 in | Wt 207.0 lb

## 2023-08-18 DIAGNOSIS — R1032 Left lower quadrant pain: Secondary | ICD-10-CM

## 2023-08-18 DIAGNOSIS — Z87442 Personal history of urinary calculi: Secondary | ICD-10-CM

## 2023-08-18 NOTE — Telephone Encounter (Signed)
 KUB ordered. Appt scheduled.

## 2023-08-18 NOTE — Progress Notes (Signed)
 08/18/23 1:08 PM   Breanna Ramos [birth year] [id number]  CC: Left-sided back pain, nephrolithiasis, stress incontinence  HPI: 53 year old female with a number of medical issues and chronic back pain who is referred for nephrolithiasis.  She had a CT in June 2024 showing a 6 mm left distal ureteral stone and a 6 mm left lower pole stone.  She never saw that stone pass.  She continues to have left-sided flank pain.  She denies any significant urinary symptoms aside from baseline stress incontinence.  She has been told she has a weak pelvic floor.  She would like to avoid any surgeries unless absolutely necessary.  Denies any gross hematuria, recent urinalysis benign.   PMH: Past Medical History:  Diagnosis Date   Acute focal neurologic deficit with complete resolution    Anxiety    Arthritis    Carpal tunnel syndrome    Cerebral venous sinus thrombosis    Cerebral venous sinus thrombosis    Chronic, continuous use of opioids    Depression    Diabetes mellitus without complication (HCC)    Fibromyalgia    GERD (gastroesophageal reflux disease)    Grade I diastolic dysfunction    H/O cardiac radiofrequency ablation    History of rectal bleeding 08/2020   Hypertension    Iron deficiency anemia    Kidney stone 11/23/2022   ? in which side" pt stated   Microprolactinoma (HCC) 2016   Nicotine dependence    Normocytic anemia    On long term drug therapy    OSA on CPAP    Pituitary tumor    PSVT (paroxysmal supraventricular tachycardia) (HCC)    None since ablation   Pulsatile tinnitus    sporadic   Rectal bleeding    SBO (small bowel obstruction) (HCC)    Stroke (HCC) 07/02/2021   Some short term memory issues    Surgical History: Past Surgical History:  Procedure Laterality Date   ANKLE ARTHROSCOPY Left 01/29/2023   Procedure: 29898 - ARTHROSCOPY DEBRIDEMENT EXTENSIVE;  Surgeon: Rosetta Posner, DPM;  Location: [facility];  Service: Orthopedics/Podiatry;  Laterality:  Left;   BACK SURGERY     x 2   BONE EXCISION Left 01/29/2023   Procedure: 40981 - EXCISION BONE SPUR TIBIA;  Surgeon: Rosetta Posner, DPM;  Location: [facility];  Service: Orthopedics/Podiatry;  Laterality: Left;   CARDIAC ELECTROPHYSIOLOGY STUDY AND ABLATION     CESAREAN SECTION     CHOLECYSTECTOMY     COLONOSCOPY WITH PROPOFOL N/A 03/07/2020   Procedure: COLONOSCOPY WITH PROPOFOL;  Surgeon: Toney Reil, MD;  Location: [facility] ENDOSCOPY;  Service: Gastroenterology;  Laterality: N/A;   CYSTOSCOPY N/A 12/03/2022   Procedure: CYSTOSCOPY;  Surgeon: Conard Novak, MD;  Location: [facility] ORS;  Service: Gynecology;  Laterality: N/A;   FRACTURE SURGERY     Left Ankle   MINOR HARDWARE REMOVAL Left 01/29/2023   Procedure: MINOR HARDWARE REMOVAL;  Surgeon: Rosetta Posner, DPM;  Location: [facility];  Service: Orthopedics/Podiatry;  Laterality: Left;  REMOVED 3 SCREWS FROM LEFT ANKLE.  DISCARDED.   ROBOTIC ASSISTED LAPAROSCOPIC HYSTERECTOMY AND SALPINGECTOMY Bilateral 12/03/2022   Procedure: XI ROBOTIC ASSISTED LAPAROSCOPIC HYSTERECTOMY AND SALPINGECTOMY;  Surgeon: Conard Novak, MD;  Location: [facility] ORS;  Service: Gynecology;  Laterality: Bilateral;     Family History: Family History  Problem Relation Age of Onset   Stroke Mother    Diabetes Mother    Heart disease Mother    Breast cancer Mother 43  Stroke Father     Social History:  reports that she has been smoking cigarettes. She started smoking about 25 years ago. She has a 6.3 pack-year smoking history. She has never used smokeless tobacco. She reports current alcohol use. She reports that she does not use drugs.  Physical Exam: BP 117/83   Ht 5' 6.5" (1.689 m)   Wt 207 lb (93.9 kg)   LMP 11/14/2022 (Exact Date)   BMI 32.91 kg/m    Constitutional:  Alert and oriented, No acute distress. Cardiovascular: No clubbing, cyanosis, or edema. Respiratory: Normal respiratory effort, no increased work of  breathing. GI: Abdomen is soft, nontender, nondistended, no abdominal masses   Laboratory Data: Reviewed  Pertinent Imaging: I have personally viewed and interpreted the prior CT scans as well as the stat CT scan today that shows no evidence of ureteral stone or hydronephrosis, stable 6 mm left lower pole stone.  Assessment & Plan:   53 year old female with history of nephrolithiasis, CT today showing a stable nonobstructing 6 mm left lower pole stone.  We reviewed this would not be the cause of her left flank pain and suspect musculoskeletal causes/disc issues.  Stone prevention strategies discussed.  Continue stone surveillance and repeat KUB in 1 year.  Regarding her stress incontinence I encouraged her to pursue pelvic floor physical therapy, could also consider pessary with GYN, she is not interested in surgical options at this time  No urologic cause of her back pain Strategies discussed regarding stress incontinence RTC 1 year KUB prior for surveillance of 6 mm left lower pole stone   Legrand Rams, MD 08/18/2023  [facility] [address], [address] [place], [place] [address] [phone]

## 2023-08-24 ENCOUNTER — Ambulatory Visit: Payer: No Typology Code available for payment source | Admitting: Orthopedic Surgery

## 2023-08-24 NOTE — Progress Notes (Deleted)
 Referring Physician:  Enid Baas, MD [address] [place],  [place] [address]  Primary Physician:  Enid Baas, MD  History of Present Illness: 08/24/2023 Ms. Breanna Ramos has a history of HTN, PSVT, OSA with CPAP, DM, chronic pain, and TIA with cerebral venous sinus thrombosis.   Seen in ED on 06/18/23 for LBP that started after vacuuming. History of fusion L3-L4 in 2019.  She has constant LBP, left > right, with right posterior leg pain to her mid thigh. No left leg pain. Pain is worse with standing, walking, and any activity. She has relief with her medications. She's had numbness in both legs since her surgery. No new tingling in her legs. She had weakness in legs last week but feels like it is improving.   She sees Probation officer in [place]. She is on percocet, neurontin, and flexeril. She was recently started on butrans patch. Given steroid dose pack from ED on 06/18/23.   She smokes 5 cigarettes a day x 20 years.   Bowel/Bladder Dysfunction: none. She has known UC. She has bowel urgency.    Conservative measures:  Physical therapy: PT at [facility] in 2024 about 5 visits  Multimodal medical therapy including regular antiinflammatories: flexeril, prednisone, Celebrex, Percocet, Trazodone, Gabapentin (sees [facility] in [place]). Injections:  caudal ESI 06/06/22 with no relief   Past Surgery:   Lumbar fusion L3-L4 2019 Cervical spine surgery 2013  The symptoms are causing a significant impact on the patient's life.   Review of Systems:  A 10 point review of systems is negative, except for the pertinent positives and negatives detailed in the HPI.  Past Medical History: Past Medical History:  Diagnosis Date   Acute focal neurologic deficit with complete resolution    Anxiety    Arthritis    Carpal tunnel syndrome    Cerebral venous sinus thrombosis    Cerebral venous sinus thrombosis    Chronic, continuous use of opioids     Depression    Diabetes mellitus without complication (HCC)    Fibromyalgia    GERD (gastroesophageal reflux disease)    Grade I diastolic dysfunction    H/O cardiac radiofrequency ablation    History of rectal bleeding 08/2020   Hypertension    Iron deficiency anemia    Kidney stone 11/23/2022   ? in which side" pt stated   Microprolactinoma (HCC) 2016   Nicotine dependence    Normocytic anemia    On long term drug therapy    OSA on CPAP    Pituitary tumor    PSVT (paroxysmal supraventricular tachycardia) (HCC)    None since ablation   Pulsatile tinnitus    sporadic   Rectal bleeding    SBO (small bowel obstruction) (HCC)    Stroke (HCC) 07/02/2021   Some short term memory issues    Past Surgical History: Past Surgical History:  Procedure Laterality Date   ANKLE ARTHROSCOPY Left 01/29/2023   Procedure: 29898 - ARTHROSCOPY DEBRIDEMENT EXTENSIVE;  Surgeon: Rosetta Posner, DPM;  Location: [facility];  Service: Orthopedics/Podiatry;  Laterality: Left;   BACK SURGERY     x 2   BONE EXCISION Left 01/29/2023   Procedure: 60454 - EXCISION BONE SPUR TIBIA;  Surgeon: Rosetta Posner, DPM;  Location: [facility];  Service: Orthopedics/Podiatry;  Laterality: Left;   CARDIAC ELECTROPHYSIOLOGY STUDY AND ABLATION     CESAREAN SECTION     CHOLECYSTECTOMY     COLONOSCOPY WITH PROPOFOL N/A 03/07/2020   Procedure:  COLONOSCOPY WITH PROPOFOL;  Surgeon: Toney Reil, MD;  Location: [facility];  Service: Gastroenterology;  Laterality: N/A;   CYSTOSCOPY N/A 12/03/2022   Procedure: CYSTOSCOPY;  Surgeon: Conard Novak, MD;  Location: [facility];  Service: Gynecology;  Laterality: N/A;   FRACTURE SURGERY     Left Ankle   MINOR HARDWARE REMOVAL Left 01/29/2023   Procedure: MINOR HARDWARE REMOVAL;  Surgeon: Rosetta Posner, DPM;  Location: [facility];  Service: Orthopedics/Podiatry;  Laterality: Left;  REMOVED 3 SCREWS FROM LEFT ANKLE.  DISCARDED.   ROBOTIC ASSISTED  LAPAROSCOPIC HYSTERECTOMY AND SALPINGECTOMY Bilateral 12/03/2022   Procedure: XI ROBOTIC ASSISTED LAPAROSCOPIC HYSTERECTOMY AND SALPINGECTOMY;  Surgeon: Conard Novak, MD;  Location: [facility];  Service: Gynecology;  Laterality: Bilateral;    Allergies: Allergies as of 08/26/2023 - Review Complete 08/18/2023  Allergen Reaction Noted   Celecoxib Other (See Comments) 06/18/2023   Silicone Other (See Comments) 01/19/2023   Tape  01/19/2023   Codeine Other (See Comments) 03/07/2019    Medications: Outpatient Encounter Medications as of 08/26/2023  Medication Sig   ascorbic acid (VITAMIN C) 500 MG tablet Take 500 mg by mouth daily.    atorvastatin (LIPITOR) 80 MG tablet Take 0.5 tablets (40 mg total) by mouth at bedtime.   buprenorphine (BUTRANS) 5 MCG/HR PTWK Place 1 patch onto the skin once a week.   cabergoline (DOSTINEX) 0.5 MG tablet Take 0.5 tablets (0.25 mg total) by mouth 2 (two) times a week. Tuesday/Saturday   cyclobenzaprine (FLEXERIL) 10 MG tablet Take 10 mg by mouth 3 (three) times daily as needed for muscle spasms.   diltiazem (TIAZAC) 240 MG 24 hr capsule Take 240 mg by mouth daily.    famciclovir (FAMVIR) 500 MG tablet Take 500 mg by mouth every 8 (eight) hours as needed.   FLUoxetine (PROZAC) 20 MG tablet Take 60 mg by mouth daily.   gabapentin (NEURONTIN) 300 MG capsule Take 300 mg by mouth at bedtime.   lamoTRIgine (LAMICTAL) 200 MG tablet Take 200 mg by mouth daily.   lamoTRIgine (LAMICTAL) 25 MG tablet Take 50 mg by mouth at bedtime.   loratadine (CLARITIN) 10 MG tablet Take 10 mg by mouth daily.    losartan (COZAAR) 50 MG tablet Take 1 tablet (50 mg total) by mouth once daily as needed If your top BP number >145   Magnesium Oxide 500 MG TABS Take 1 tablet by mouth at bedtime.   mesalamine (LIALDA) 1.2 g EC tablet Take 1.2 g by mouth in the morning, at noon, in the evening, and at bedtime.   metFORMIN (GLUCOPHAGE) 500 MG tablet Take 500-1,000 mg by mouth 2 (two) times  daily with a meal. 100 mg qam and 500 mg qpm   Multiple Vitamin (MULTI-VITAMIN) tablet Take 1 tablet by mouth at bedtime.   ondansetron (ZOFRAN-ODT) 8 MG disintegrating tablet Take 8 mg by mouth every 8 (eight) hours as needed for nausea or vomiting.   oxyCODONE-acetaminophen (PERCOCET) 10-325 MG tablet Take 1 tablet by mouth every 6 (six) hours as needed for pain.    pantoprazole (PROTONIX) 40 MG tablet Take 40 mg by mouth 2 (two) times daily.   propranolol (INDERAL) 10 MG tablet Take 10 mg by mouth 2 (two) times daily.   propranolol (INDERAL) 20 MG tablet Take 20 mg by mouth 2 (two) times daily.   traZODone (DESYREL) 100 MG tablet Take 100 mg by mouth at bedtime.   No facility-administered encounter medications on file as of 08/26/2023.  Social History: Social History   Tobacco Use   Smoking status: Every Day    Current packs/day: 0.25    Average packs/day: 0.3 packs/day for 25.2 years (6.3 ttl pk-yrs)    Types: Cigarettes    Start date: 05/26/1998   Smokeless tobacco: Never  Vaping Use   Vaping status: Never Used  Substance Use Topics   Alcohol use: Yes    Comment: ~1 beer/week   Drug use: Never    Family Medical History: Family History  Problem Relation Age of Onset   Stroke Mother    Diabetes Mother    Heart disease Mother    Breast cancer Mother 83   Stroke Father     Physical Examination: There were no vitals filed for this visit.  General: Patient is well developed, well nourished, calm, collected, and in no apparent distress. Attention to examination is appropriate.  Respiratory: Patient is breathing without any difficulty.   NEUROLOGICAL:     Awake, alert, oriented to person, place, and time.  Speech is clear and fluent. Fund of knowledge is appropriate.   Cranial Nerves: Pupils equal round and reactive to light.  Facial tone is symmetric.    Well healed lumbar incision. Mild diffuse lower lumbar tenderness.   No abnormal lesions on exposed skin.    Strength: Side Biceps Triceps Deltoid Interossei Grip Wrist Ext. Wrist Flex.  R 5 5 5 5 5 5 5   L 5 5 5 5 5 5 5    Side Iliopsoas Quads Hamstring PF DF EHL  R 5 5 5 5 5 5   L 5 5 5 5 5 5    Reflexes are 2+ and symmetric at the biceps, brachioradialis, patella and achilles.   Left achilles not tested due to ankle brace.   Hoffman's is absent.  Clonus is not present.   Bilateral upper and lower extremity sensation is intact to light touch.     Slow gait with cane.   She has brace on left ankle.   Medical Decision Making  Imaging: Lumbar CT scan dated 06/18/23:  FINDINGS: Segmentation: 5 non rib-bearing lumbar type vertebral bodies are present. The lowest fully formed vertebral body is L5.   Alignment: No significant listhesis is present. Straightening of the normal lumbar lordosis is again noted. Rightward curvature of the thoracolumbar spine is centered at L1. Compensatory leftward curvature is present at L4-5 and L5-S1.   Vertebrae: Superior endplate Schmorl's nodes are present at L1. Schmorl's node is present at L1-2. Vertebral body heights are normal. No focal osseous lesions are present. The right L4 pedicle screw terminates lateral to the vertebral body.   Paraspinal and other soft tissues: A 12 mm nonobstructing stone is present at the lower pole of the left kidney. A punctate scratched at 2 punctate nonobstructing stones are present at the upper pole of the left kidney. No other focal solid organ lesions are present. No significant adenopathy is present.   Disc levels: T12-L1: Insert normal disc.   L1-2: Normal disc signal and height is present. No focal protrusion or stenosis is present.   L2-3: Mild disc bulge in is asymmetric to the left. Asymmetric left-sided facet hypertrophy is present. No significant stenosis is present.   L3-4: Patient is status post PLIF. Right laminectomy and foraminotomy is noted. No residual or recurrent stenosis is  present. Pedicle screw and rod fixation is intact. No bridging bone is present across the disc space.   L4-5: Mild disc bulging and facet hypertrophy is present bilaterally.  No significant stenosis or change is present.   L5-S1: Mild facet hypertrophy is present the right. No focal disc protrusion or stenosis is present.   IMPRESSION: 1. Status post PLIF at L3-4 without residual or recurrent stenosis. 2. The right L4 pedicle screw terminates lateral to the vertebral body. 3. No significant bridging bone is present across the L3-4 disc. The fusion is incomplete. 4. Mild disc bulging and facet hypertrophy at L2-3 and L4-5 without significant stenosis. 5. Mild facet hypertrophy at L5-S1 without significant stenosis. 6. 12 mm nonobstructing stone at the lower pole of the left kidney. 7. Two punctate nonobstructing stones at the upper pole of the left kidney.     Electronically Signed   By: Marin Roberts M.D.   On: 06/18/2023 14:59  I have personally reviewed the images and agree with the above interpretation.  Assessment and Plan: Ms. Blackard has a history of fusion L3-L4 in 2019.  She has constant LBP, left > right, with right posterior leg pain to her mid thigh. No left leg pain. Pain is worse with standing, walking, and any activity. She's had numbness in both legs since her surgery. No new tingling in her legs. She had weakness in legs last week but feels like it is improving.   Fusion L3-L4 as above with no obvious bridging bone on CT scan. She has multilevel lumbar spondylosis and facet hypertrophy. No compression seen on CT.   CT also shows left kidney stones.   LBP may be from pseudoarthrosis at L3-L4 along with underlying spondylosis. Kidney stones may be contributing as well.   Treatment options discussed with patient and following plan made:   - Recommend PT for lumbar spine. She wants to go to [facility]. Message to her PCP to see if she can order.  - Follow up with  urology as scheduled.  - Continue with current medications from [facility] including percocet, butrans patch, flexeril, and neurontin.  - If no improvement with PT, she may be candidate for surgery. See below.  - Follow up with me in 6-8 weeks and prn.    Imaging reviewed with Dr. Katrinka Blazing after her visit. He agrees that she has pseudoarthrosis at L4-L5. He recommends lumbar xrays with flex/ext at her follow up. Depending on this, would also need scoliosis xrays and updated lumbar MRI prior to discussion of surgery. He would want to discuss options with her prior to proceeding with SCS.   BP was elevated, it was 140/90. No symptoms of chest pain, shortness of breath, blurry vision, or headaches. She checks BP at home and it generally runs lower. Will recheck at home and call PCP if not improved. If she develops CP, SOB, blurry vision, or headaches, then she will go to ED.     I spent a total of 30 minutes in face-to-face and non-face-to-face activities related to this patient's care today including review of outside records, review of imaging, review of symptoms, physical exam, discussion of differential diagnosis, discussion of treatment options, and documentation.   Thank you for involving me in the care of this patient.   Drake Leach PA-C Dept. of Neurosurgery

## 2023-08-26 ENCOUNTER — Ambulatory Visit: Admitting: Orthopedic Surgery

## 2023-08-28 ENCOUNTER — Encounter: Payer: Self-pay | Admitting: Internal Medicine

## 2023-08-31 ENCOUNTER — Ambulatory Visit: Admitting: Internal Medicine

## 2023-08-31 VITALS — BP 124/80 | HR 87 | Ht 66.5 in | Wt 207.0 lb

## 2023-08-31 DIAGNOSIS — R232 Flushing: Secondary | ICD-10-CM | POA: Diagnosis not present

## 2023-08-31 DIAGNOSIS — D352 Benign neoplasm of pituitary gland: Secondary | ICD-10-CM | POA: Diagnosis not present

## 2023-08-31 MED ORDER — BROMOCRIPTINE MESYLATE 2.5 MG PO TABS: 1.2500 mg | ORAL_TABLET | Freq: Every day | ORAL | 2 refills | Status: DC

## 2023-08-31 NOTE — Progress Notes (Unsigned)
 Name: Breanna Ramos  MRN/ DOB: [id number], [birth year]    Age/ Sex: 53 y.o., female    PCP: Enid Baas, MD   Reason for Endocrinology Evaluation: Pituitary adenoma     Date of Initial Endocrinology Evaluation: 10/09/2022    HPI: Breanna Ramos is a 53 y.o. female with a past medical history of sinus venous thrombosis, anxiety, depression, pituitary adenoma, SVT, fibromyalgia, bipolar d/o . The patient presented for initial endocrinology clinic visit on 10/09/2022 for consultative assistance with her pituitary adenoma.    Patient developed galactorrhea in the summer 2015 as well as 1 year of amenorrhea at the time..  Prolactin levels were in the 100s, MRI showed 5 mm tumor of the pituitary gland.  She was started on cabergoline at the time, she had a small visual field defect in March 2017, this worsened by October 2017.  Brain MRI showed decrease in pituitary gland from 5 mm to 3 mm  The size of prolactinoma did not fit the great visual field deficit that the patient had at the time.   MRI 2023 did not show pituitary adenoma, but was noted with partial empty Sella   She is on cabergoline 0.5 mg twice weekly  ( Tuesday and Saturdays)    She follows neurology  She also follows with Hematology / Oncology , off anti-angulation  06/2022 Follows with psychiatry   Denies palpitations, S/P cardiac ablation in 2019  due to SVT   She has history of glucose cord injections in the back     SUBJECTIVE:    Today (08/31/23):  Breanna Ramos is here for follow-up on pituitary microadenoma.  Patient presented to the ED with back pain 06/17/2022 and was provided with a prednisone taper. She follows with neurosurery and pain management specialist She follows with urology for nephrolithiasis and stress incontinence She is s/p left ankle arthroscopy with extensive debridement, hardware removal and spur resection 01/2023 S/P hysterectomy 11/2022  Denies headaches  Denies vision changes   Denies recent galactorrhea  She has nausea and vomiting 3-4x weekly  that she attributes to [place]  Was diagnosed UC   Has hot flashes worsening in nature since summer 2024 and dyspareunia  Mother with Breast Ca  She would like estrogen levels to be checked    Cabergoline 0.5 mg, half tablet twice weekly  HISTORY:  Past Medical History:  Past Medical History:  Diagnosis Date   Acute focal neurologic deficit with complete resolution    Anxiety    Arthritis    Carpal tunnel syndrome    Cerebral venous sinus thrombosis    Cerebral venous sinus thrombosis    Chronic, continuous use of opioids    Depression    Diabetes mellitus without complication (HCC)    Fibromyalgia    GERD (gastroesophageal reflux disease)    Grade I diastolic dysfunction    H/O cardiac radiofrequency ablation    History of rectal bleeding 08/2020   Hypertension    Iron deficiency anemia    Kidney stone 11/23/2022   ? in which side" pt stated   Microprolactinoma (HCC) 2016   Nicotine dependence    Normocytic anemia    On long term drug therapy    OSA on CPAP    Pituitary tumor    PSVT (paroxysmal supraventricular tachycardia) (HCC)    None since ablation   Pulsatile tinnitus    sporadic   Rectal bleeding    SBO (small bowel obstruction) (HCC)    Stroke (HCC) 07/02/2021  Some short term memory issues   Past Surgical History:  Past Surgical History:  Procedure Laterality Date   ANKLE ARTHROSCOPY Left 01/29/2023   Procedure: 29898 - ARTHROSCOPY DEBRIDEMENT EXTENSIVE;  Surgeon: Rosetta Posner, DPM;  Location: [facility];  Service: Orthopedics/Podiatry;  Laterality: Left;   BACK SURGERY     x 2   BONE EXCISION Left 01/29/2023   Procedure: 40981 - EXCISION BONE SPUR TIBIA;  Surgeon: Rosetta Posner, DPM;  Location: [facility];  Service: Orthopedics/Podiatry;  Laterality: Left;   CARDIAC ELECTROPHYSIOLOGY STUDY AND ABLATION     CESAREAN SECTION     CHOLECYSTECTOMY      COLONOSCOPY WITH PROPOFOL N/A 03/07/2020   Procedure: COLONOSCOPY WITH PROPOFOL;  Surgeon: Toney Reil, MD;  Location: [facility];  Service: Gastroenterology;  Laterality: N/A;   CYSTOSCOPY N/A 12/03/2022   Procedure: CYSTOSCOPY;  Surgeon: Conard Novak, MD;  Location: [facility];  Service: Gynecology;  Laterality: N/A;   FRACTURE SURGERY     Left Ankle   MINOR HARDWARE REMOVAL Left 01/29/2023   Procedure: MINOR HARDWARE REMOVAL;  Surgeon: Rosetta Posner, DPM;  Location: [facility];  Service: Orthopedics/Podiatry;  Laterality: Left;  REMOVED 3 SCREWS FROM LEFT ANKLE.  DISCARDED.   ROBOTIC ASSISTED LAPAROSCOPIC HYSTERECTOMY AND SALPINGECTOMY Bilateral 12/03/2022   Procedure: XI ROBOTIC ASSISTED LAPAROSCOPIC HYSTERECTOMY AND SALPINGECTOMY;  Surgeon: Conard Novak, MD;  Location: [facility];  Service: Gynecology;  Laterality: Bilateral;    Social History:  reports that she has been smoking cigarettes. She started smoking about 25 years ago. She has a 6.3 pack-year smoking history. She has never used smokeless tobacco. She reports current alcohol use. She reports that she does not use drugs. Family History: family history includes Breast cancer (age of onset: 59) in her mother; Diabetes in her mother; Heart disease in her mother; Stroke in her father and mother.   HOME MEDICATIONS: Allergies as of 08/31/2023       Reactions   Celecoxib Other (See Comments)   NSAIDs are contraindicated d/t ulcerative colitis   Silicone Other (See Comments)   Skin tear with tape removal after hysterectomy on face.   Tape    Skin tear with tape removal after hysterectomy   Codeine Other (See Comments)   Cough syrup with codeine caused hyperactivity        Medication List        Accurate as of August 31, 2023  2:51 PM. If you have any questions, ask your nurse or doctor.          ascorbic acid 500 MG tablet Commonly known as: VITAMIN C Take 500 mg by mouth daily.   atorvastatin  80 MG tablet Commonly known as: LIPITOR Take 0.5 tablets (40 mg total) by mouth at bedtime.   buprenorphine 5 MCG/HR Ptwk Commonly known as: BUTRANS Place 1 patch onto the skin once a week.   buprenorphine 10 MCG/HR Ptwk Commonly known as: BUTRANS Place 1 patch onto the skin once a week.   cabergoline 0.5 MG tablet Commonly known as: DOSTINEX Take 0.5 tablets (0.25 mg total) by mouth 2 (two) times a week. Tuesday/Saturday   cyclobenzaprine 10 MG tablet Commonly known as: FLEXERIL Take 10 mg by mouth 3 (three) times daily as needed for muscle spasms.   diltiazem 240 MG 24 hr capsule Commonly known as: TIAZAC Take 240 mg by mouth daily.   famciclovir 500 MG tablet Commonly known as: FAMVIR Take 500 mg by mouth every 8 (eight) hours  as needed.   FLUoxetine 20 MG tablet Commonly known as: PROZAC Take 60 mg by mouth daily.   gabapentin 300 MG capsule Commonly known as: NEURONTIN Take 300 mg by mouth at bedtime.   lamoTRIgine 200 MG tablet Commonly known as: LAMICTAL Take 200 mg by mouth daily.   lamoTRIgine 25 MG tablet Commonly known as: LAMICTAL Take 50 mg by mouth at bedtime.   loratadine 10 MG tablet Commonly known as: CLARITIN Take 10 mg by mouth daily.   losartan 50 MG tablet Commonly known as: COZAAR Take 1 tablet (50 mg total) by mouth once daily as needed If your top BP number >145   Magnesium Oxide -Mg Supplement 500 MG Tabs Take 1 tablet by mouth at bedtime.   mesalamine 1.2 g EC tablet Commonly known as: LIALDA Take 1.2 g by mouth in the morning, at noon, in the evening, and at bedtime.   metFORMIN 500 MG tablet Commonly known as: GLUCOPHAGE Take 500-1,000 mg by mouth 2 (two) times daily with a meal. 100 mg qam and 500 mg qpm   Multi-Vitamin tablet Take 1 tablet by mouth at bedtime.   ondansetron 8 MG disintegrating tablet Commonly known as: ZOFRAN-ODT Take 8 mg by mouth every 8 (eight) hours as needed for nausea or vomiting.    oxyCODONE-acetaminophen 10-325 MG tablet Commonly known as: PERCOCET Take 1 tablet by mouth every 6 (six) hours as needed for pain.   pantoprazole 40 MG tablet Commonly known as: PROTONIX Take 40 mg by mouth 2 (two) times daily.   polyethylene glycol-electrolytes 420 g solution Commonly known as: NuLYTELY See admin instructions.   propranolol 20 MG tablet Commonly known as: INDERAL Take 20 mg by mouth 2 (two) times daily.   propranolol 10 MG tablet Commonly known as: INDERAL Take 10 mg by mouth 2 (two) times daily.   traZODone 100 MG tablet Commonly known as: DESYREL Take 100 mg by mouth at bedtime.          REVIEW OF SYSTEMS: A comprehensive ROS was conducted with the patient and is negative except as per HPI    OBJECTIVE:  VS: BP 124/80 (BP Location: Left Arm, Patient Position: Sitting, Cuff Size: Normal)   Pulse 87   Ht 5' 6.5" (1.689 m)   Wt 207 lb (93.9 kg) Comment: patient reported  LMP 11/14/2022 (Exact Date)   SpO2 98%   BMI 32.91 kg/m    Wt Readings from Last 3 Encounters:  08/31/23 207 lb (93.9 kg)  08/18/23 207 lb (93.9 kg)  06/29/23 207 lb (93.9 kg)     EXAM: General: Pt appears well and is in NAD  Eyes: External eye exam normal without stare, lid lag or exophthalmos.  EOM intact.    Neck: General: Supple without adenopathy. Thyroid: Thyroid size normal.  No goiter or nodules appreciated  Lungs: Clear with good BS bilat   Heart: Auscultation: RRR.  Abdomen:  soft, nontender  Extremities:  BL LE: No pretibial edema   Mental Status: Judgment, insight: Intact Orientation: Oriented to time, place, and person Mood and affect: No depression, anxiety, or agitation     DATA REVIEWED: 08/20/2023 ACTH 9.7 pg/mL  Na 141 K 3.9 BUN 11 Cr. 0.5 GFR 113  Cortisol 26 ( 6.7-22.6) IGF 98 Prolactin 28 ( 3.6-25.2) TSH 2.008     09/18/2022 Prolactin 27.4 ( 4.8-33.4)    03/12/2022 12 ng/mL    MRI 07/02/2021 Dedicated pituitary imaging  reveals a partially empty sella turcica. No focal pituitary  lesion is identified. The suprasellar cistern is patent. The pituitary stalk is midline and is not abnormally thickened.   Old records , labs and images have been reviewed.   ASSESSMENT/PLAN/RECOMMENDATIONS:   Pituitary Microadenoma   -No local symptoms -Her most recent MRI in 2023 showed no evidence of pituitary microadenoma but there was evidence of partial empty sella turcica -Recent labs done through PCPs office were reviewed to include normal ACTH, TFTs, IGF-I, and slightly elevated prolactin and serum cortisol.  Of note the patient was on prednisone 05/2023   2. Prolactinoma :  -Patient has been on cabergoline since 2015 -She continues to suffer from nausea and vomiting with cabergoline  -I have recommended switching to bromocriptine as below  Medication Stop cabergoline 0.5 mg, half a tablet twice weekly Start bromocriptine 2.5 mg, half a tablet at bedtime  3.  Hot flashes  -FSH/estradiol* -High risk for estrogen replacement therapy due to mother with breast cancer -Will defer to GYN   Signed electronically by: Lyndle Herrlich, MD  [facility]  [facility] [address] [place], [place] [address] Phone: [phone] FAX: [phone]   CC: Enid Baas, MD [address] [place] [address] Phone: [phone] Fax: [phone]   Return to Endocrinology clinic as below: Future Appointments  Date Time Provider Department Center  09/02/2023 11:00 AM [facility] LAB [facility] None  09/03/2023 11:00 AM Jeralyn Ruths, MD [facility] None  09/03/2023 11:45 AM [facility] INFUSION CHAIR 8 [facility] None  09/04/2023  2:00 PM [facility] MM [facility]2 [facility]  09/09/2023  9:00 AM [facility] PROCEDURE [facility] None  08/17/2024  8:30 AM Sondra Come, MD [facility] None

## 2023-08-31 NOTE — Telephone Encounter (Signed)
 [id number] to schedule lab and follow up appointment with Dr. Lonzo Cloud.

## 2023-08-31 NOTE — Patient Instructions (Signed)
 Stop cabergoline Start bromocriptine 2.5 mg, half a tablet at bedtime daily

## 2023-09-01 ENCOUNTER — Other Ambulatory Visit: Payer: Self-pay | Admitting: *Deleted

## 2023-09-01 ENCOUNTER — Encounter: Payer: Self-pay | Admitting: Internal Medicine

## 2023-09-01 DIAGNOSIS — D509 Iron deficiency anemia, unspecified: Secondary | ICD-10-CM

## 2023-09-01 LAB — ESTRADIOL: Estradiol: 65 pg/mL

## 2023-09-01 LAB — FOLLICLE STIMULATING HORMONE: FSH: 12.5 m[IU]/mL

## 2023-09-02 ENCOUNTER — Inpatient Hospital Stay: Payer: No Typology Code available for payment source | Attending: Oncology

## 2023-09-02 DIAGNOSIS — Z86718 Personal history of other venous thrombosis and embolism: Secondary | ICD-10-CM | POA: Diagnosis not present

## 2023-09-02 DIAGNOSIS — Z862 Personal history of diseases of the blood and blood-forming organs and certain disorders involving the immune mechanism: Secondary | ICD-10-CM | POA: Diagnosis present

## 2023-09-02 DIAGNOSIS — D509 Iron deficiency anemia, unspecified: Secondary | ICD-10-CM

## 2023-09-02 LAB — CBC WITH DIFFERENTIAL/PLATELET
Abs Immature Granulocytes: 0.06 10*3/uL (ref 0.00–0.07)
Basophils Absolute: 0.1 10*3/uL (ref 0.0–0.1)
Basophils Relative: 1 %
Eosinophils Absolute: 0.6 10*3/uL — ABNORMAL HIGH (ref 0.0–0.5)
Eosinophils Relative: 6 %
HCT: 41 % (ref 36.0–46.0)
Hemoglobin: 13.7 g/dL (ref 12.0–15.0)
Immature Granulocytes: 1 %
Lymphocytes Relative: 25 %
Lymphs Abs: 2.4 10*3/uL (ref 0.7–4.0)
MCH: 30.9 pg (ref 26.0–34.0)
MCHC: 33.4 g/dL (ref 30.0–36.0)
MCV: 92.3 fL (ref 80.0–100.0)
Monocytes Absolute: 0.8 10*3/uL (ref 0.1–1.0)
Monocytes Relative: 8 %
Neutro Abs: 5.9 10*3/uL (ref 1.7–7.7)
Neutrophils Relative %: 59 %
Platelets: 319 10*3/uL (ref 150–400)
RBC: 4.44 MIL/uL (ref 3.87–5.11)
RDW: 12.8 % (ref 11.5–15.5)
WBC: 9.8 10*3/uL (ref 4.0–10.5)
nRBC: 0 % (ref 0.0–0.2)

## 2023-09-02 LAB — IRON AND TIBC
Iron: 70 ug/dL (ref 28–170)
Saturation Ratios: 20 % (ref 10.4–31.8)
TIBC: 349 ug/dL (ref 250–450)
UIBC: 279 ug/dL

## 2023-09-02 LAB — FERRITIN: Ferritin: 16 ng/mL (ref 11–307)

## 2023-09-03 ENCOUNTER — Inpatient Hospital Stay: Payer: No Typology Code available for payment source

## 2023-09-03 ENCOUNTER — Encounter: Payer: Self-pay | Admitting: Oncology

## 2023-09-03 ENCOUNTER — Inpatient Hospital Stay: Payer: No Typology Code available for payment source | Admitting: Oncology

## 2023-09-03 VITALS — BP 131/94 | HR 86 | Temp 96.8°F | Resp 16 | Ht 66.5 in | Wt 206.8 lb

## 2023-09-03 DIAGNOSIS — D509 Iron deficiency anemia, unspecified: Secondary | ICD-10-CM | POA: Diagnosis not present

## 2023-09-03 DIAGNOSIS — Z862 Personal history of diseases of the blood and blood-forming organs and certain disorders involving the immune mechanism: Secondary | ICD-10-CM | POA: Diagnosis not present

## 2023-09-03 NOTE — Progress Notes (Signed)
 [facility]  [phone] [phone]  ID: Osborn Coho OB: 1970-09-12  MR#: [id number]  OZD#:[id number]  Patient Care Team: Enid Baas, MD as PCP - General (Internal Medicine) Jeralyn Ruths, MD as Consulting Physician (Hematology and Oncology)   CHIEF COMPLAINT: Iron deficiency anemia.  INTERVAL HISTORY: Patient returns to clinic today for repeat laboratory, further evaluation, and consideration of additional IV Venofer.  She continues to have chronic back pain, but otherwise feels well.  She has no new neurologic complaints today.  She denies any recent fevers or illnesses.  She has a good appetite and denies weight loss.  She has no chest pain, shortness of breath, cough, or hemoptysis.  She denies any nausea, vomiting, constipation, or diarrhea.  She has no urinary complaints.  Patient offers no further specific complaints today.  REVIEW OF SYSTEMS:   Review of Systems  Constitutional: Negative.  Negative for fever, malaise/fatigue and weight loss.  Respiratory: Negative.  Negative for cough, hemoptysis and shortness of breath.   Cardiovascular: Negative.  Negative for chest pain and leg swelling.  Gastrointestinal: Negative.  Negative for abdominal pain.  Genitourinary: Negative.  Negative for dysuria.  Musculoskeletal:  Positive for back pain. Negative for joint pain.  Skin: Negative.  Negative for rash.  Neurological: Negative.  Negative for dizziness, tingling, sensory change, speech change, focal weakness, weakness and headaches.  Psychiatric/Behavioral:  The patient is not nervous/anxious.     As per HPI. Otherwise, a complete review of systems is negative.  PAST MEDICAL HISTORY: Past Medical History:  Diagnosis Date   Acute focal neurologic deficit with complete resolution    Anxiety    Arthritis    Carpal tunnel syndrome    Cerebral venous sinus thrombosis    Cerebral venous sinus thrombosis    Chronic, continuous  use of opioids    Depression    Diabetes mellitus without complication (HCC)    Fibromyalgia    GERD (gastroesophageal reflux disease)    Grade I diastolic dysfunction    H/O cardiac radiofrequency ablation    History of rectal bleeding 08/2020   Hypertension    Iron deficiency anemia    Kidney stone 11/23/2022   ? in which side" pt stated   Microprolactinoma (HCC) 2016   Nicotine dependence    Normocytic anemia    On long term drug therapy    OSA on CPAP    Pituitary tumor    PSVT (paroxysmal supraventricular tachycardia) (HCC)    None since ablation   Pulsatile tinnitus    sporadic   Rectal bleeding    SBO (small bowel obstruction) (HCC)    Stroke (HCC) 07/02/2021   Some short term memory issues    PAST SURGICAL HISTORY: Past Surgical History:  Procedure Laterality Date   ANKLE ARTHROSCOPY Left 01/29/2023   Procedure: 29898 - ARTHROSCOPY DEBRIDEMENT EXTENSIVE;  Surgeon: Rosetta Posner, DPM;  Location: [facility];  Service: Orthopedics/Podiatry;  Laterality: Left;   BACK SURGERY     x 2   BONE EXCISION Left 01/29/2023   Procedure: 25956 - EXCISION BONE SPUR TIBIA;  Surgeon: Rosetta Posner, DPM;  Location: [facility];  Service: Orthopedics/Podiatry;  Laterality: Left;   CARDIAC ELECTROPHYSIOLOGY STUDY AND ABLATION     CESAREAN SECTION     CHOLECYSTECTOMY     COLONOSCOPY WITH PROPOFOL N/A 03/07/2020   Procedure: COLONOSCOPY WITH PROPOFOL;  Surgeon: Toney Reil, MD;  Location: [facility];  Service: Gastroenterology;  Laterality: N/A;   CYSTOSCOPY N/A  12/03/2022   Procedure: CYSTOSCOPY;  Surgeon: Conard Novak, MD;  Location: [facility] OR[facility];  Service: Gynecology;  Laterality: N/A;   FRACTURE SURGERY     Left Ankle   MINOR HARDWARE REMOVAL Left 01/29/2023   Procedure: MINOR HARDWARE REMOVAL;  Surgeon: Rosetta Posner, DPM;  Location: [facility];  Service: Orthopedics/Podiatry;  Laterality: Left;  REMOVED 3 SCREWS FROM LEFT ANKLE.  DISCARDED.    ROBOTIC ASSISTED LAPAROSCOPIC HYSTERECTOMY AND SALPINGECTOMY Bilateral 12/03/2022   Procedure: XI ROBOTIC ASSISTED LAPAROSCOPIC HYSTERECTOMY AND SALPINGECTOMY;  Surgeon: Conard Novak, MD;  Location: [facility] OR[facility];  Service: Gynecology;  Laterality: Bilateral;    FAMILY HISTORY: Family History  Problem Relation Age of Onset   Stroke Mother    Diabetes Mother    Heart disease Mother    Breast cancer Mother 24   Stroke Father     ADVANCED DIRECTIVES (Y/N):  N  HEALTH MAINTENANCE: Social History   Tobacco Use   Smoking status: Every Day    Current packs/day: 0.25    Average packs/day: 0.3 packs/day for 25.3 years (6.3 ttl pk-yrs)    Types: Cigarettes    Start date: 05/26/1998   Smokeless tobacco: Never  Vaping Use   Vaping status: Never Used  Substance Use Topics   Alcohol use: Yes    Comment: ~1 beer/week   Drug use: Never     Colonoscopy:  PAP:  Bone density:  Lipid panel:  Allergies  Allergen Reactions   Celecoxib Other (See Comments)    NSAIDs are contraindicated d/t ulcerative colitis   Silicone Other (See Comments)    Skin tear with tape removal after hysterectomy on face.   Tape     Skin tear with tape removal after hysterectomy   Codeine Other (See Comments)    Cough syrup with codeine caused hyperactivity    Current Outpatient Medications  Medication Sig Dispense Refill   ascorbic acid (VITAMIN C) 500 MG tablet Take 500 mg by mouth daily.      atorvastatin (LIPITOR) 80 MG tablet Take 0.5 tablets (40 mg total) by mouth at bedtime.     buprenorphine (BUTRANS) 10 MCG/HR PTWK Place 1 patch onto the skin once a week.     buprenorphine (BUTRANS) 5 MCG/HR PTWK Place 1 patch onto the skin once a week.     cyclobenzaprine (FLEXERIL) 10 MG tablet Take 10 mg by mouth 3 (three) times daily as needed for muscle spasms.     diltiazem (TIAZAC) 240 MG 24 hr capsule Take 240 mg by mouth daily.      famciclovir (FAMVIR) 500 MG tablet Take 500 mg by mouth every 8 (eight)  hours as needed.     FLUoxetine (PROZAC) 20 MG tablet Take 60 mg by mouth daily.     gabapentin (NEURONTIN) 300 MG capsule Take 300 mg by mouth at bedtime.     lamoTRIgine (LAMICTAL) 200 MG tablet Take 200 mg by mouth daily.     lamoTRIgine (LAMICTAL) 25 MG tablet Take 50 mg by mouth at bedtime.     loratadine (CLARITIN) 10 MG tablet Take 10 mg by mouth daily.      losartan (COZAAR) 50 MG tablet Take 1 tablet (50 mg total) by mouth once daily as needed If your top BP number >145     Magnesium Oxide 500 MG TABS Take 1 tablet by mouth at bedtime.     mesalamine (LIALDA) 1.2 g EC tablet Take 1.2 g by mouth in the morning, at noon, in the  evening, and at bedtime.     metFORMIN (GLUCOPHAGE) 500 MG tablet Take 500-1,000 mg by mouth 2 (two) times daily with a meal. 100 mg qam and 500 mg qpm     Multiple Vitamin (MULTI-VITAMIN) tablet Take 1 tablet by mouth at bedtime.     ondansetron (ZOFRAN-ODT) 8 MG disintegrating tablet Take 8 mg by mouth every 8 (eight) hours as needed for nausea or vomiting.     oxyCODONE-acetaminophen (PERCOCET) 10-325 MG tablet Take 1 tablet by mouth every 6 (six) hours as needed for pain.      pantoprazole (PROTONIX) 40 MG tablet Take 40 mg by mouth 2 (two) times daily.     polyethylene glycol-electrolytes (NULYTELY) 420 g solution See admin instructions.     propranolol (INDERAL) 10 MG tablet Take 10 mg by mouth 2 (two) times daily.     propranolol (INDERAL) 20 MG tablet Take 20 mg by mouth 2 (two) times daily.     traZODone (DESYREL) 100 MG tablet Take 100 mg by mouth at bedtime.     bromocriptine (PARLODEL) 2.5 MG tablet Take 0.5 tablets (1.25 mg total) by mouth at bedtime. (Patient not taking: Reported on 09/03/2023) 45 tablet 2   No current facility-administered medications for this visit.    OBJECTIVE: Vitals:   09/03/23 1104  BP: (!) 131/94  Pulse: 86  Resp: 16  Temp: (!) 96.8 F (36 C)  SpO2: 96%       Body mass index is 32.88 kg/m.    ECOG FS:0 -  Asymptomatic  General: Well-developed, well-nourished, no acute distress. Eyes: Pink conjunctiva, anicteric sclera. HEENT: Normocephalic, moist mucous membranes. Lungs: No audible wheezing or coughing. Heart: Regular rate and rhythm. Abdomen: Soft, nontender, no obvious distention. Musculoskeletal: No edema, cyanosis, or clubbing. Neuro: Alert, answering all questions appropriately. Cranial nerves grossly intact. Skin: No rashes or petechiae noted. Psych: Normal affect.   LAB RESULTS:  Lab Results  Component Value Date   NA 137 11/23/2022   K 4.1 11/23/2022   CL 102 11/23/2022   CO2 26 11/23/2022   GLUCOSE 93 11/23/2022   BUN 18 11/23/2022   CREATININE 0.55 11/23/2022   CALCIUM 8.8 (L) 11/23/2022   PROT 6.7 11/23/2022   ALBUMIN 4.0 11/23/2022   AST 37 11/23/2022   ALT 49 (H) 11/23/2022   ALKPHOS 94 11/23/2022   BILITOT 0.5 11/23/2022   GFRNONAA >60 11/23/2022   GFRAA >60 02/15/2020    Lab Results  Component Value Date   WBC 9.8 09/02/2023   NEUTROABS 5.9 09/02/2023   HGB 13.7 09/02/2023   HCT 41.0 09/02/2023   MCV 92.3 09/02/2023   PLT 319 09/02/2023   Lab Results  Component Value Date   IRON 70 09/02/2023   TIBC 349 09/02/2023   IRONPCTSAT 20 09/02/2023   Lab Results  Component Value Date   FERRITIN 16 09/02/2023     STUDIES: CT RENAL STONE STUDY Addendum Date: 08/18/2023 ADDENDUM REPORT: 08/18/2023 14:57 Electronically Signed   By: Lupita Raider M.D.   On: 08/18/2023 14:57   Result Date: 08/18/2023 CLINICAL DATA:  Left flank pain. EXAM: CT ABDOMEN AND PELVIS WITHOUT CONTRAST TECHNIQUE: Multidetector CT imaging of the abdomen and pelvis was performed following the standard protocol without IV contrast. RADIATION DOSE REDUCTION: This exam was performed according to the departmental dose-optimization program which includes automated exposure control, adjustment of the mA and/or kV according to patient size and/or use of iterative reconstruction technique.  COMPARISON:  November 23, 2022. FINDINGS: Lower  chest: No acute abnormality. Hepatobiliary: No focal liver abnormality is seen. Status post cholecystectomy. No biliary dilatation. Pancreas: Unremarkable. No pancreatic ductal dilatation or surrounding inflammatory changes. Spleen: Normal in size without focal abnormality. Adrenals/Urinary Tract: Adrenal glands appear normal. Nonobstructive left nephrolithiasis is noted, with largest calculus measuring 8 mm in lower pole collecting system. No hydronephrosis or renal obstruction is noted. Urinary bladder is decompressed. Stomach/Bowel: Stomach is within normal limits. Appendix appears normal. No evidence of bowel wall thickening, distention, or inflammatory changes. Vascular/Lymphatic: No significant vascular findings are present. No enlarged abdominal or pelvic lymph nodes. Reproductive: Status post hysterectomy. No adnexal masses. Other: No abdominal wall hernia or abnormality. No abdominopelvic ascites. Musculoskeletal: Status post surgical posterior fusion of L3-4. No acute osseous abnormality is noted. IMPRESSION: Nonobstructive left nephrolithiasis. No hydronephrosis or renal obstruction. Electronically Signed: By: Lupita Raider M.D. On: 08/18/2023 14:55    ASSESSMENT: Iron deficiency anemia.  PLAN:    Iron deficiency anemia: Resolved.  Patient's hemoglobin and iron stores continue to be within normal limits.  She does not require treatment today.  She last received IV Venofer on November 04, 2022.  No further intervention is needed.  Return to clinic in 6 months with repeat laboratory, further evaluation, and consideration of additional IV Venofer if needed.  If patient's laboratory work remains within normal limits at that time, she likely can be discharged from clinic.   Cerebral sinus thrombosis: Patient has now discontinued anticoagulation.  Previously, CT venogram of the head on July 02, 2021 reviewed independently with a 2.5 cm filling defect in the  right transverse sinus consistent with thrombus, although unclear if this was acute or chronic.  Full hypercoagulable workup was negative.  Recommendations for an unprovoked sinus clot is 12 months of treatment and then discontinue.  Patient expressed understanding that while recurrence is unlikely she has approximately 2 to 7% chance of this happening.  No further intervention is needed.  Patient had DVT or other blood clot would consider lifelong treatment at that time.   Fatigue: Patient does not complain of this today.  Likely secondary to chronic pain from degenerative back disease as well as left ankle surgery.    I spent a total of 20 minutes reviewing chart data, face-to-face evaluation with the patient, counseling and coordination of care as detailed above.   Patient expressed understanding and was in agreement with this plan. She also understands that She can call clinic at any time with any questions, concerns, or complaints.    Jeralyn Ruths, MD   09/03/2023 3:06 PM

## 2023-09-04 ENCOUNTER — Ambulatory Visit
Admission: RE | Admit: 2023-09-04 | Discharge: 2023-09-04 | Disposition: A | Discharge status: Home / Self Care | Source: Ambulatory Visit | Attending: Internal Medicine | Admitting: Internal Medicine

## 2023-09-04 DIAGNOSIS — Z1231 Encounter for screening mammogram for malignant neoplasm of breast: Secondary | ICD-10-CM | POA: Insufficient documentation

## 2023-09-09 ENCOUNTER — Ambulatory Visit

## 2023-09-09 DIAGNOSIS — K5120 Ulcerative (chronic) proctitis without complications: Secondary | ICD-10-CM | POA: Diagnosis present

## 2023-10-21 ENCOUNTER — Encounter

## 2023-12-09 ENCOUNTER — Encounter

## 2024-01-21 ENCOUNTER — Encounter: Payer: Self-pay | Admitting: Urology

## 2024-01-21 NOTE — Telephone Encounter (Signed)
 Can see me now. Does not need to wait until she is done with PT.

## 2024-01-21 NOTE — Telephone Encounter (Signed)
 Patient advised and scheduled.

## 2024-01-31 NOTE — Progress Notes (Unsigned)
 Referring Physician:  Sherial Bail, MD [address] [place],  [place] [address]  Primary Physician:  Sherial Bail, MD  History of Present Illness: 01/31/2024 Breanna Ramos has a history of HTN, PSVT, OSA with CPAP, DM, chronic pain, and TIA with cerebral venous sinus thrombosis.   History of fusion L3-L4 in 2019.  Last seen by me on 06/29/23 for back and right leg pain. CT showed pseudoarthrosis L3-L4 with multilevel lumbar spondylosis and facet hypertrophy. No compression seen on CT.   She has been going to PT at [facility]. She was to continue with medications from [facility].   She is here for follow up.         She has constant LBP, left > right, with right posterior leg pain to her mid thigh. No left leg pain. Pain is worse with standing, walking, and any activity. She has relief with her medications. She's had numbness in both legs since her surgery. No new tingling in her legs. She had weakness in legs last week but feels like it is improving.   She sees Probation officer in [place]. She is on percocet, neurontin , and flexeril . She was recently started on butrans patch. Given steroid dose pack from ED on 06/18/23.   She smokes 5 cigarettes a day x 20 years.   Bowel/Bladder Dysfunction: none. She has known UC. She has bowel urgency.    Conservative measures:  Physical therapy: PT at [facility] currently*** Multimodal medical therapy including regular antiinflammatories: flexeril , prednisone , Celebrex, Percocet, Trazodone, Gabapentin  (sees [facility] in [place]). Injections:  caudal ESI 06/06/22 with no relief   Past Surgery:   Lumbar fusion L3-L4 2019 Cervical spine surgery 2013  The symptoms are causing a significant impact on the patient's life.   Review of Systems:  A 10 point review of systems is negative, except for the pertinent positives and negatives detailed in the HPI.  Past Medical History: Past Medical History:   Diagnosis Date   Acute focal neurologic deficit with complete resolution    Anxiety    Arthritis    Carpal tunnel syndrome    Cerebral venous sinus thrombosis    Cerebral venous sinus thrombosis    Chronic, continuous use of opioids    Depression    Diabetes mellitus without complication (HCC)    Fibromyalgia    GERD (gastroesophageal reflux disease)    Grade I diastolic dysfunction    H/O cardiac radiofrequency ablation    History of rectal bleeding 08/2020   Hypertension    Iron  deficiency anemia    Kidney stone 11/23/2022   ? in which side pt stated   Microprolactinoma (HCC) 2016   Nicotine  dependence    Normocytic anemia    On long term drug therapy    OSA on CPAP    Pituitary tumor    PSVT (paroxysmal supraventricular tachycardia) (HCC)    None since ablation   Pulsatile tinnitus    sporadic   Rectal bleeding    SBO (small bowel obstruction) (HCC)    Stroke (HCC) 07/02/2021   Some short term memory issues    Past Surgical History: Past Surgical History:  Procedure Laterality Date   ANKLE ARTHROSCOPY Left 01/29/2023   Procedure: 29898 - ARTHROSCOPY DEBRIDEMENT EXTENSIVE;  Surgeon: Lennie Barter, DPM;  Location: [facility];  Service: Orthopedics/Podiatry;  Laterality: Left;   BACK SURGERY     x 2   BONE EXCISION Left 01/29/2023   Procedure: 72359 - EXCISION BONE  SPUR TIBIA;  Surgeon: Lennie Barter, DPM;  Location: [facility];  Service: Orthopedics/Podiatry;  Laterality: Left;   CARDIAC ELECTROPHYSIOLOGY STUDY AND ABLATION     CESAREAN SECTION     CHOLECYSTECTOMY     COLONOSCOPY WITH PROPOFOL  N/A 03/07/2020   Procedure: COLONOSCOPY WITH PROPOFOL ;  Surgeon: Unk Corinn Skiff, MD;  Location: [facility] ENDOSCOPY;  Service: Gastroenterology;  Laterality: N/A;   CYSTOSCOPY N/A 12/03/2022   Procedure: CYSTOSCOPY;  Surgeon: Leonce Garnette BIRCH, MD;  Location: [facility] OR[facility];  Service: Gynecology;  Laterality: N/A;   FRACTURE SURGERY     Left Ankle   MINOR  HARDWARE REMOVAL Left 01/29/2023   Procedure: MINOR HARDWARE REMOVAL;  Surgeon: Lennie Barter, DPM;  Location: [facility];  Service: Orthopedics/Podiatry;  Laterality: Left;  REMOVED 3 SCREWS FROM LEFT ANKLE.  DISCARDED.   ROBOTIC ASSISTED LAPAROSCOPIC HYSTERECTOMY AND SALPINGECTOMY Bilateral 12/03/2022   Procedure: XI ROBOTIC ASSISTED LAPAROSCOPIC HYSTERECTOMY AND SALPINGECTOMY;  Surgeon: Leonce Garnette BIRCH, MD;  Location: [facility];  Service: Gynecology;  Laterality: Bilateral;    Allergies: Allergies as of 02/04/2024 - Review Complete 09/03/2023  Allergen Reaction Noted   Celecoxib Other (See Comments) 06/18/2023   Silicone Other (See Comments) 01/19/2023   Tape  01/19/2023   Codeine Other (See Comments) 03/07/2019    Medications: Outpatient Encounter Medications as of 02/04/2024  Medication Sig   ascorbic acid (VITAMIN C) 500 MG tablet Take 500 mg by mouth daily.    atorvastatin  (LIPITOR ) 80 MG tablet Take 0.5 tablets (40 mg total) by mouth at bedtime.   bromocriptine  (PARLODEL ) 2.5 MG tablet Take 0.5 tablets (1.25 mg total) by mouth at bedtime. (Patient not taking: Reported on 09/03/2023)   buprenorphine (BUTRANS) 10 MCG/HR PTWK Place 1 patch onto the skin once a week.   buprenorphine (BUTRANS) 5 MCG/HR PTWK Place 1 patch onto the skin once a week.   cyclobenzaprine  (FLEXERIL ) 10 MG tablet Take 10 mg by mouth 3 (three) times daily as needed for muscle spasms.   diltiazem (TIAZAC) 240 MG 24 hr capsule Take 240 mg by mouth daily.    famciclovir (FAMVIR) 500 MG tablet Take 500 mg by mouth every 8 (eight) hours as needed.   FLUoxetine  (PROZAC ) 20 MG tablet Take 60 mg by mouth daily.   gabapentin  (NEURONTIN ) 300 MG capsule Take 300 mg by mouth at bedtime.   lamoTRIgine (LAMICTAL) 200 MG tablet Take 200 mg by mouth daily.   lamoTRIgine (LAMICTAL) 25 MG tablet Take 50 mg by mouth at bedtime.   loratadine (CLARITIN) 10 MG tablet Take 10 mg by mouth daily.    losartan (COZAAR) 50 MG  tablet Take 1 tablet (50 mg total) by mouth once daily as needed If your top BP number >145   Magnesium  Oxide 500 MG TABS Take 1 tablet by mouth at bedtime.   mesalamine  (LIALDA ) 1.2 g EC tablet Take 1.2 g by mouth in the morning, at noon, in the evening, and at bedtime.   metFORMIN (GLUCOPHAGE) 500 MG tablet Take 500-1,000 mg by mouth 2 (two) times daily with a meal. 100 mg qam and 500 mg qpm   Multiple Vitamin (MULTI-VITAMIN) tablet Take 1 tablet by mouth at bedtime.   ondansetron  (ZOFRAN -ODT) 8 MG disintegrating tablet Take 8 mg by mouth every 8 (eight) hours as needed for nausea or vomiting.   oxyCODONE -acetaminophen  (PERCOCET) 10-325 MG tablet Take 1 tablet by mouth every 6 (six) hours as needed for pain.    pantoprazole  (PROTONIX ) 40 MG tablet Take 40 mg  by mouth 2 (two) times daily.   polyethylene glycol-electrolytes (NULYTELY ) 420 g solution See admin instructions.   propranolol (INDERAL) 10 MG tablet Take 10 mg by mouth 2 (two) times daily.   propranolol (INDERAL) 20 MG tablet Take 20 mg by mouth 2 (two) times daily.   traZODone (DESYREL) 100 MG tablet Take 100 mg by mouth at bedtime.   No facility-administered encounter medications on file as of 02/04/2024.    Social History: Social History   Tobacco Use   Smoking status: Every Day    Current packs/day: 0.25    Average packs/day: 0.3 packs/day for 25.7 years (6.4 ttl pk-yrs)    Types: Cigarettes    Start date: 05/26/1998   Smokeless tobacco: Never  Vaping Use   Vaping status: Never Used  Substance Use Topics   Alcohol use: Yes    Comment: ~1 beer/week   Drug use: Never    Family Medical History: Family History  Problem Relation Age of Onset   Stroke Mother    Diabetes Mother    Heart disease Mother    Breast cancer Mother 14   Stroke Father     Physical Examination: There were no vitals filed for this visit.    Awake, alert, oriented to person, place, and time.  Speech is clear and fluent. Fund of knowledge is  appropriate.   Cranial Nerves: Pupils equal round and reactive to light.  Facial tone is symmetric.    Well healed lumbar incision. Mild diffuse lower lumbar tenderness.   No abnormal lesions on exposed skin.   Strength: Side Biceps Triceps Deltoid Interossei Grip Wrist Ext. Wrist Flex.  R 5 5 5 5 5 5 5   L 5 5 5 5 5 5 5    Side Iliopsoas Quads Hamstring PF DF EHL  R 5 5 5 5 5 5   L 5 5 5 5 5 5    Reflexes are 2+ and symmetric at the biceps, brachioradialis, patella and achilles.   Left achilles not tested due to ankle brace.   Hoffman's is absent.  Clonus is not present.   Bilateral upper and lower extremity sensation is intact to light touch.     Slow gait with cane.   She has brace on left ankle.   Medical Decision Making  Imaging: none  Assessment and Plan: Ms. Bolls has a history of fusion L3-L4 in 2019.  She has constant LBP, left > right, with right posterior leg pain to her mid thigh. No left leg pain. Pain is worse with standing, walking, and any activity. She's had numbness in both legs since her surgery. No new tingling in her legs. She had weakness in legs last week but feels like it is improving.   Fusion L3-L4 as above with no obvious bridging bone on CT scan. She has multilevel lumbar spondylosis and facet hypertrophy. No compression seen on CT.   CT also shows left kidney stones.   LBP may be from pseudoarthrosis at L3-L4 along with underlying spondylosis. Kidney stones may be contributing as well.   Treatment options discussed with patient and following plan made:   - Recommend PT for lumbar spine. She wants to go to [facility]. Message to her PCP to see if she can order.  - Follow up with urology as scheduled.  - Continue with current medications from [facility] including percocet, butrans patch, flexeril , and neurontin .  - If no improvement with PT, she may be candidate for surgery. See below.  - Follow up  with me in 6-8 weeks and prn.    Imaging reviewed  with Dr. Claudene after her visit. He agrees that she has pseudoarthrosis at L4-L5. He recommends lumbar xrays with flex/ext at her follow up. Depending on this, would also need scoliosis xrays and updated lumbar MRI prior to discussion of surgery. He would want to discuss options with her prior to proceeding with SCS.   BP was elevated, it was 140/90. No symptoms of chest pain, shortness of breath, blurry vision, or headaches. She checks BP at home and it generally runs lower. Will recheck at home and call PCP if not improved. If she develops CP, SOB, blurry vision, or headaches, then she will go to ED.     I spent a total of *** minutes in face-to-face and non-face-to-face activities related to this patient's care today including review of outside records, review of imaging, review of symptoms, physical exam, discussion of differential diagnosis, discussion of treatment options, and documentation.   Glade Boys PA-C Dept. of Neurosurgery

## 2024-02-01 ENCOUNTER — Ambulatory Visit: Payer: Self-pay

## 2024-02-04 ENCOUNTER — Ambulatory Visit: Admitting: Orthopedic Surgery

## 2024-02-04 ENCOUNTER — Encounter: Payer: Self-pay | Admitting: Orthopedic Surgery

## 2024-02-04 VITALS — BP 140/80 | Ht 66.5 in | Wt 206.0 lb

## 2024-02-04 DIAGNOSIS — M47816 Spondylosis without myelopathy or radiculopathy, lumbar region: Secondary | ICD-10-CM

## 2024-02-04 DIAGNOSIS — M5416 Radiculopathy, lumbar region: Secondary | ICD-10-CM

## 2024-02-04 DIAGNOSIS — M960 Pseudarthrosis after fusion or arthrodesis: Secondary | ICD-10-CM

## 2024-02-04 DIAGNOSIS — Z981 Arthrodesis status: Secondary | ICD-10-CM

## 2024-02-04 DIAGNOSIS — S32009K Unspecified fracture of unspecified lumbar vertebra, subsequent encounter for fracture with nonunion: Secondary | ICD-10-CM

## 2024-02-04 NOTE — Patient Instructions (Signed)
 It was so nice to see you today. Thank you so much for coming in.   I want to get an MRI of your lower back to look into things further. We will get this approved through your insurance and [facility] will call you to schedule the appointment. Ask about your patient responsibility. You do not need to pay this prior to getting MRI, they can bill you.   When you get the MRI, I want to get a full scoliosis xray along with lumbar xrays. Remind them to do these.  After you have the imaging, it can take 14-28 days for me to get the results back. If I don't have them in 2 weeks, we will call to try to get the results.   Once I have the results, I will send you a message.  I will discuss things with PT and and let you know.   You would need to stop smoking prior to surgery.   Your blood pressure was elevated today. I want you to recheck it at home and follow up with your PCP if it remains high. If you have any chest pain, shortness of breath, blurry vision, or headaches then you need to go to ED.    Please do not hesitate to call if you have any questions or concerns. You can also message me in MyChart.   Glade Boys PA-C [phone]     The physicians and staff at [facility] are committed to providing excellent care. You may receive a survey asking for feedback about your experience at our office. We value you your feedback and appreciate you taking the time to to fill it out. The [facility] leadership team is also available to discuss your experience in person, feel free to contact us  [phone].

## 2024-02-08 ENCOUNTER — Ambulatory Visit: Payer: Self-pay

## 2024-02-09 ENCOUNTER — Ambulatory Visit
Admission: RE | Admit: 2024-02-09 | Discharge: 2024-02-09 | Disposition: A | Discharge status: Home / Self Care | Source: Ambulatory Visit | Attending: Orthopedic Surgery | Admitting: Orthopedic Surgery

## 2024-02-09 ENCOUNTER — Encounter: Payer: Self-pay | Admitting: Oncology

## 2024-02-09 DIAGNOSIS — Z981 Arthrodesis status: Secondary | ICD-10-CM | POA: Diagnosis present

## 2024-02-09 DIAGNOSIS — M47816 Spondylosis without myelopathy or radiculopathy, lumbar region: Secondary | ICD-10-CM | POA: Diagnosis present

## 2024-02-09 DIAGNOSIS — S32009K Unspecified fracture of unspecified lumbar vertebra, subsequent encounter for fracture with nonunion: Secondary | ICD-10-CM | POA: Insufficient documentation

## 2024-02-09 DIAGNOSIS — M5416 Radiculopathy, lumbar region: Secondary | ICD-10-CM | POA: Insufficient documentation

## 2024-02-11 ENCOUNTER — Ambulatory Visit
Admission: RE | Admit: 2024-02-11 | Discharge: 2024-02-11 | Disposition: A | Discharge status: Home / Self Care | Source: Ambulatory Visit | Attending: Orthopedic Surgery | Admitting: Orthopedic Surgery

## 2024-02-11 ENCOUNTER — Ambulatory Visit
Admission: RE | Admit: 2024-02-11 | Discharge: 2024-02-11 | Disposition: A | Discharge status: Home / Self Care | Attending: Orthopedic Surgery | Admitting: Orthopedic Surgery

## 2024-02-11 DIAGNOSIS — M47816 Spondylosis without myelopathy or radiculopathy, lumbar region: Secondary | ICD-10-CM | POA: Diagnosis present

## 2024-02-11 DIAGNOSIS — M5416 Radiculopathy, lumbar region: Secondary | ICD-10-CM | POA: Diagnosis present

## 2024-02-11 DIAGNOSIS — S32009K Unspecified fracture of unspecified lumbar vertebra, subsequent encounter for fracture with nonunion: Secondary | ICD-10-CM

## 2024-02-11 DIAGNOSIS — Z981 Arthrodesis status: Secondary | ICD-10-CM | POA: Diagnosis present

## 2024-02-15 ENCOUNTER — Ambulatory Visit: Payer: Self-pay

## 2024-02-15 ENCOUNTER — Encounter: Payer: Self-pay | Admitting: Orthopedic Surgery

## 2024-02-15 NOTE — Telephone Encounter (Signed)
 Scoliosis xrays dated 02/11/24:  FINDINGS: Ten radiographic images.   Lower cervical ACDF hardware in place through C7. Bulky adjacent segment disease and similar disc and endplate degeneration at C4-C5 and C3-C4. Evidence of associated lower cervical interbody arthrodesis. Normal thoracic segmentation, as demonstrated by CT in January. Mild thoracic scoliosis, levoconvex upper thoracic curvature at T2-T3, mild dextroconvex curvature at T5-T6, levoconvex curvature at T9-T10. Maximal associated spinal curvature is 10 degrees. Stable straightening of thoracic kyphosis. Maintained vertebral height. Widespread thoracic degenerative endplate spurring. Lumbar segmentation is normal. Previous L3-L4 posterior and interbody fusion. Dextroconvex upper and mild levoconvex lower lumbar scoliosis. Maximal lumbar scoliotic curvature is 15 degrees (apex at L1-L2). Stable straightening of lumbar lordosis. Chronic degenerative endplate spurring most pronounced at L1-L2. No acute osseous abnormality identified.   Cholecystectomy clips. Negative visible chest, abdominal, pelvic visceral contours.   IMPRESSION: 1. Mild scoliosis, maximal curvature 10 degrees in the thoracic spine, 15 degrees at the upper lumbar dextroconvex scoliosis with apex at L1-L2. 2. Prior cervical ACDF with evidence of solid interbody arthrodesis. Adjacent segment disease. L3-L4 lumbar spinal fusion hardware. Advanced lumbar disc and endplate degeneration at L1-L2. 3.  No acute osseous abnormality identified.     Electronically Signed   By: VEAR Hurst M.D.   On: 02/14/2024 11:32   Lumbar xrays dated 02/11/24:  FINDINGS: Mostly dextroconvex lumbar scoliosis on these 4 images, stable to that detailed separately today. Associated straightening of lumbar lordosis. Chronic L3-L4 posterior and interbody fusion sequelae. Chronic disc and endplate degeneration elsewhere maximal at L1-L2.   Lateral views in neutral, flexion, and  extension positioning demonstrate decreased range of motion in the lumbar spine, no abnormal motion.   Intact visible sacrum and SI joints. No acute osseous abnormality identified. Stable cholecystectomy clips.   IMPRESSION: 1. Chronic L3-L4 posterior and interbody fusion. Decreased range of motion in the lumbar spine and no abnormal motion on flexion/extension. 2. Mostly dextroconvex lumbar scoliosis on these images, see scoliosis series details reported separately.     Electronically Signed   By: VEAR Hurst M.D.   On: 02/14/2024 11:40   Lumbar MRI dated 02/09/24:  FINDINGS: Segmentation: Normal on the comparison study a, the same numbering system used previously.   Alignment: Straightening of lumbar lordosis with mild mostly upper lumbar dextroconvex scoliosis as detailed separately today. Alignment appears stable since 2023.   Vertebrae: Normal background bone marrow signal. Maintained vertebral height. Chronic spinal fusion hardware susceptibility artifact at L3-L4. Evidence now of degenerative marrow edema at the anterior superior L3 endplate which is new since 2023. And minimal degenerative appearing endplate marrow edema at L4-L5 asymmetric to the right. Intact visible sacrum and SI joints and no other marrow edema or acute osseous abnormality.   Conus medullaris and cauda equina: Conus extends to the T12-L1 level. No lower spinal cord or conus signal abnormality.   Paraspinal and other soft tissues: Negative; minimal postoperative changes to the posterior lumbar paraspinal soft tissues.   Disc levels:   T11-T12 and T12-L1:  Negative.   L1-L2: Disc space loss with mostly anterior disc bulging and endplate spurring. No stenosis.   L2-L3: Mild circumferential disc bulge. Moderate left facet hypertrophy, mild facet and ligament flavum hypertrophy otherwise. No stenosis. This level appears mildly progressed since 2023.   L3-L4:  Stable decompression and fusion  appearance.   L4-L5: Mostly preserved disc space. Far lateral disc bulging and endplate spurring which now appears asymmetric to the right and progressed there since 2023 (series 8, image 24).  Mild right side posterior element hypertrophy. Capacious spinal canal. No spinal or lateral recess stenosis. Moderate right L4 neural foraminal stenosis appears increased.   L5-S1: Negative disc. Moderate facet hypertrophy greater on the right. No stenosis.   IMPRESSION: 1. L3-L4 decompression and fusion appears stable.   2. Adjacent segment disease at L2-L3 appears mildly progressed since 2023 but with no associated stenosis. Adjacent segment disease at L4-L5 with progressed right far lateral disc osteophyte complex, increased and moderate right neural foraminal stenosis. Query Right L4 radiculitis.   3. L1-L2 disc and endplate degeneration without stenosis.     Electronically Signed   By: VEAR Hurst M.D.   On: 02/14/2024 11:50  I have personally reviewed the images and agree with the above interpretation.

## 2024-02-22 ENCOUNTER — Ambulatory Visit: Payer: Self-pay

## 2024-02-23 ENCOUNTER — Encounter: Payer: Self-pay | Admitting: Urology

## 2024-02-25 ENCOUNTER — Ambulatory Visit: Payer: Self-pay

## 2024-02-29 ENCOUNTER — Ambulatory Visit: Payer: Self-pay

## 2024-03-01 ENCOUNTER — Other Ambulatory Visit: Payer: Self-pay | Admitting: *Deleted

## 2024-03-01 ENCOUNTER — Ambulatory Visit (INDEPENDENT_AMBULATORY_CARE_PROVIDER_SITE_OTHER): Admitting: Internal Medicine

## 2024-03-01 VITALS — BP 140/84 | Ht 66.0 in | Wt 211.8 lb

## 2024-03-01 DIAGNOSIS — D352 Benign neoplasm of pituitary gland: Secondary | ICD-10-CM | POA: Diagnosis not present

## 2024-03-01 DIAGNOSIS — E236 Other disorders of pituitary gland: Secondary | ICD-10-CM | POA: Diagnosis not present

## 2024-03-01 DIAGNOSIS — D509 Iron deficiency anemia, unspecified: Secondary | ICD-10-CM

## 2024-03-01 MED ORDER — BROMOCRIPTINE MESYLATE 2.5 MG PO TABS: 2.5000 mg | ORAL_TABLET | Freq: Every day | ORAL | 2 refills | Status: AC

## 2024-03-01 NOTE — Progress Notes (Signed)
 Name: Breanna Ramos  MRN/ DOB: [id number], [birth year]    Age/ Sex: 53 y.o., female    PCP: Sherial Bail, MD   Reason for Endocrinology Evaluation: Pituitary adenoma     Date of Initial Endocrinology Evaluation: 10/09/2022    HPI: Breanna Ramos is a 53 y.o. female with a past medical history of sinus venous thrombosis, anxiety, depression, pituitary adenoma, SVT, fibromyalgia, bipolar d/o, ulcerative colitis. The patient presented for initial endocrinology clinic visit on 10/09/2022 for consultative assistance with her pituitary adenoma.    Patient developed galactorrhea in the summer 2015 as well as 1 year of amenorrhea at the time..  Prolactin levels were in the 100s, MRI showed 5 mm tumor of the pituitary gland.  She was started on cabergoline  at the time, she had a small visual field defect in March 2017, this worsened by October 2017.  Brain MRI showed decrease in pituitary gland from 5 mm to 3 mm  The size of prolactinoma did not fit the great visual field deficit that the patient had at the time.   MRI 2023 did not show pituitary adenoma, but was noted with partial empty Sella   She was  cabergoline  0.5 mg twice weekly  ( Tuesday and Saturdays)    She follows neurology  She also follows with Hematology / Oncology , off anti-angulation  06/2022   Denies palpitations, S/P cardiac ablation in 2019  due to SVT  S/p hysterectomy in 2024   We switch cabergoline  to bromocriptine  due to nausea and vomiting in April, 2025  SUBJECTIVE:    Today (03/01/24):  Breanna Ramos is here for follow-up on pituitary microadenoma.  Patient follows with the [facility] clinic gastroenterology for the management of ulcerative colitis, she is on mesalamine  Patient on chronic narcotic for pain syndrome Patient follows with neurosurgery for chronic back pain   She continues with nausea and vomiting since 2015 , despite switching to bromocriptine   She has GERD symptoms  She continues with  fatigue  No headaches  Denies headaches  No vision changes  No galactorrhea , just soreness  She S/P hysterectomy  She recently had lung cancer screening, 2 mm nodule was noted, pulmonary is following, she is down to 5 cigarettes a day     Bromocriptine  2.5 mg, half a tablet at bedtime  HISTORY:  Past Medical History:  Past Medical History:  Diagnosis Date   Acute focal neurologic deficit with complete resolution    Anxiety    Arthritis    Carpal tunnel syndrome    Cerebral venous sinus thrombosis    Cerebral venous sinus thrombosis    Chronic, continuous use of opioids    Depression    Diabetes mellitus without complication (HCC)    Fibromyalgia    GERD (gastroesophageal reflux disease)    Grade I diastolic dysfunction    H/O cardiac radiofrequency ablation    History of rectal bleeding 08/2020   Hypertension    Iron  deficiency anemia    Kidney stone 11/23/2022   ? in which side pt stated   Microprolactinoma (HCC) 2016   Nicotine  dependence    Normocytic anemia    On long term drug therapy    OSA on CPAP    Pituitary tumor    PSVT (paroxysmal supraventricular tachycardia)    None since ablation   Pulsatile tinnitus    sporadic   Rectal bleeding    SBO (small bowel obstruction) (HCC)    Stroke (HCC) 07/02/2021   Some  short term memory issues   Past Surgical History:  Past Surgical History:  Procedure Laterality Date   ANKLE ARTHROSCOPY Left 01/29/2023   Procedure: 29898 - ARTHROSCOPY DEBRIDEMENT EXTENSIVE;  Surgeon: Lennie Barter, DPM;  Location: [facility];  Service: Orthopedics/Podiatry;  Laterality: Left;   BACK SURGERY     x 2   BONE EXCISION Left 01/29/2023   Procedure: 72359 - EXCISION BONE SPUR TIBIA;  Surgeon: Lennie Barter, DPM;  Location: [facility];  Service: Orthopedics/Podiatry;  Laterality: Left;   CARDIAC ELECTROPHYSIOLOGY STUDY AND ABLATION     CESAREAN SECTION     CHOLECYSTECTOMY     COLONOSCOPY WITH PROPOFOL  N/A 03/07/2020    Procedure: COLONOSCOPY WITH PROPOFOL ;  Surgeon: Unk Corinn Skiff, MD;  Location: [facility];  Service: Gastroenterology;  Laterality: N/A;   CYSTOSCOPY N/A 12/03/2022   Procedure: CYSTOSCOPY;  Surgeon: Leonce Garnette BIRCH, MD;  Location: [facility];  Service: Gynecology;  Laterality: N/A;   FRACTURE SURGERY     Left Ankle   MINOR HARDWARE REMOVAL Left 01/29/2023   Procedure: MINOR HARDWARE REMOVAL;  Surgeon: Lennie Barter, DPM;  Location: [facility];  Service: Orthopedics/Podiatry;  Laterality: Left;  REMOVED 3 SCREWS FROM LEFT ANKLE.  DISCARDED.   ROBOTIC ASSISTED LAPAROSCOPIC HYSTERECTOMY AND SALPINGECTOMY Bilateral 12/03/2022   Procedure: XI ROBOTIC ASSISTED LAPAROSCOPIC HYSTERECTOMY AND SALPINGECTOMY;  Surgeon: Leonce Garnette BIRCH, MD;  Location: [facility];  Service: Gynecology;  Laterality: Bilateral;    Social History:  reports that she has been smoking cigarettes. She started smoking about 25 years ago. She has a 6.4 pack-year smoking history. She has never used smokeless tobacco. She reports current alcohol use. She reports that she does not use drugs. Family History: family history includes Breast cancer (age of onset: 45) in her mother; Diabetes in her mother; Heart disease in her mother; Stroke in her father and mother.   HOME MEDICATIONS: Allergies as of 03/01/2024       Reactions   Celecoxib Other (See Comments)   NSAIDs are contraindicated d/t ulcerative colitis   Silicone Other (See Comments)   Skin tear with tape removal after hysterectomy on face.   Tape    Skin tear with tape removal after hysterectomy   Codeine Other (See Comments)   Cough syrup with codeine caused hyperactivity        Medication List        Accurate as of March 01, 2024  1:12 PM. If you have any questions, ask your nurse or doctor.          ascorbic acid 500 MG tablet Commonly known as: VITAMIN C Take 500 mg by mouth daily.   atorvastatin  80 MG tablet Commonly known as:  LIPITOR  Take 0.5 tablets (40 mg total) by mouth at bedtime.   bromocriptine  2.5 MG tablet Commonly known as: PARLODEL  Take 0.5 tablets (1.25 mg total) by mouth at bedtime.   buprenorphine 10 MCG/HR Ptwk Commonly known as: BUTRANS Place 1 patch onto the skin once a week.   cyclobenzaprine  10 MG tablet Commonly known as: FLEXERIL  Take 10 mg by mouth 3 (three) times daily as needed for muscle spasms.   diltiazem 240 MG 24 hr capsule Commonly known as: TIAZAC Take 240 mg by mouth daily.   estradiol  0.1 MG/GM vaginal cream Commonly known as: ESTRACE  Place 0.5 g vaginally.   famciclovir 500 MG tablet Commonly known as: FAMVIR Take 500 mg by mouth every 8 (eight) hours as needed.   FLUoxetine  20 MG tablet Commonly  known as: PROZAC  Take 60 mg by mouth daily.   gabapentin  300 MG capsule Commonly known as: NEURONTIN  Take 300 mg by mouth at bedtime.   lamoTRIgine 200 MG tablet Commonly known as: LAMICTAL Take 200 mg by mouth daily.   lamoTRIgine 25 MG tablet Commonly known as: LAMICTAL Take 50 mg by mouth at bedtime.   loratadine 10 MG tablet Commonly known as: CLARITIN Take 10 mg by mouth daily.   losartan 50 MG tablet Commonly known as: COZAAR Take 1 tablet (50 mg total) by mouth once daily as needed If your top BP number >145   Magnesium  Oxide -Mg Supplement 500 MG Tabs Take 1 tablet by mouth at bedtime.   mesalamine  1.2 g EC tablet Commonly known as: LIALDA  Take 1.2 g by mouth in the morning, at noon, in the evening, and at bedtime.   metFORMIN 500 MG tablet Commonly known as: GLUCOPHAGE Take 500-1,000 mg by mouth 2 (two) times daily with a meal. 100 mg qam and 500 mg qpm   Multi-Vitamin tablet Take 1 tablet by mouth at bedtime.   ondansetron  8 MG disintegrating tablet Commonly known as: ZOFRAN -ODT Take 8 mg by mouth every 8 (eight) hours as needed for nausea or vomiting.   ondansetron  8 MG tablet Commonly known as: ZOFRAN  Take 8 mg by mouth as  needed.   oxyCODONE -acetaminophen  10-325 MG tablet Commonly known as: PERCOCET Take 1 tablet by mouth every 6 (six) hours as needed for pain.   pantoprazole  40 MG tablet Commonly known as: PROTONIX  Take 40 mg by mouth 2 (two) times daily.   polyethylene glycol-electrolytes 420 g solution Commonly known as: NuLYTELY  See admin instructions.   propranolol 20 MG tablet Commonly known as: INDERAL Take 20 mg by mouth 2 (two) times daily.   propranolol 10 MG tablet Commonly known as: INDERAL Take 10 mg by mouth 2 (two) times daily.   traZODone 100 MG tablet Commonly known as: DESYREL Take 200 mg by mouth at bedtime.   varenicline 1 MG tablet Commonly known as: CHANTIX Take 1 mg by mouth daily.          REVIEW OF SYSTEMS: A comprehensive ROS was conducted with the patient and is negative except as per HPI    OBJECTIVE:  VS: BP (!) 140/84 (BP Location: Left Arm, Patient Position: Sitting, Cuff Size: Large)   Ht 5' 6 (1.676 m)   Wt 211 lb 12.8 oz (96.1 kg)   LMP 11/14/2022 (Exact Date)   SpO2 92%   BMI 34.19 kg/m    Wt Readings from Last 3 Encounters:  03/01/24 211 lb 12.8 oz (96.1 kg)  02/04/24 206 lb (93.4 kg)  09/03/23 206 lb 12.8 oz (93.8 kg)     EXAM: General: Pt appears well and is in NAD  Eyes: External eye exam normal without stare, lid lag or exophthalmos.  EOM intact.    Neck: General: Supple without adenopathy. Thyroid : Thyroid  size normal.  No goiter or nodules appreciated  Lungs: Clear with good BS bilat   Heart: Auscultation: RRR.  Abdomen:  soft, nontender  Extremities:  BL LE: No pretibial edema   Mental Status: Judgment, insight: Intact Orientation: Oriented to time, place, and person Mood and affect: No depression, anxiety, or agitation     DATA REVIEWED:  01/19/2024 Prolactin 35.1 (3.6-25.2)    Latest Reference Range & Units 08/31/23 15:17  FSH mIU/mL 12.5  Estradiol  pg/mL 65    08/20/2023 ACTH 9.7 pg/mL  Na 141 K 3.9 BUN  11 Cr. 0.5 GFR 113  Cortisol 26 ( 6.7-22.6) IGF 98 Prolactin 28 ( 3.6-25.2) TSH 2.008     09/18/2022 Prolactin 27.4 ( 4.8-33.4)    03/12/2022 12 ng/mL    MRI 07/02/2021 Dedicated pituitary imaging reveals a partially empty sella turcica. No focal pituitary lesion is identified. The suprasellar cistern is patent. The pituitary stalk is midline and is not abnormally thickened.   Old records , labs and images have been reviewed.   ASSESSMENT/PLAN/RECOMMENDATIONS:   Hx of Pituitary Microadenoma    -Her  MRI in 2023 showed no evidence of pituitary microadenoma but there was evidence of partial empty sella turcica -Labs done through PCPs office were reviewed to include normal ACTH, TFTs, IGF-I, and slightly elevated prolactin and serum cortisol early 2024.     2. Prolactinoma :  -Patient was on cabergoline  from 2015 until early 2025 when we discontinued it due to chronic intermittent nausea and vomiting, we switched her to bromocriptine , but the patient did not notice any changes in nausea and vomiting, the patient does have severe GERD symptoms  - She had recent prolactin level checked at PCPs office, with continued elevation of prolactin, will increase bromocriptine  -Lab orders printed to recheck prolactin in 2 months to [facility]   Medication  Increase bromocriptine  2.5 mg, 1 tablet at bedtime   Follow-up in 6 months  I spent 25 minutes preparing to see the patient by review of recent labs, imaging and procedures, obtaining and reviewing separately obtained history, communicating with the patient, ordering medications, tests or procedures, and documenting clinical information in the EHR including the differential Dx, treatment, and any further evaluation and other management    Signed electronically by: Stefano Redgie Butts, MD  [facility]  [facility] [address] [place], [place] [address] Phone: [phone] FAX:  [phone]   CC: Sherial Bail, MD [address] [place] [address] Phone: [phone] Fax: [phone]   Return to Endocrinology clinic as below: Future Appointments  Date Time Provider Department Center  03/02/2024 11:15 AM [facility] LAB [facility] None  03/03/2024 10:00 AM Clois Fret, MD [facility] [place]  03/04/2024 11:15 AM Jacobo Evalene PARAS, MD [facility] None  03/04/2024 11:45 AM [facility] INFUSION CHAIR 13 [facility] None  08/09/2024  8:30 AM Francisca Redell BROCKS, MD [facility] None

## 2024-03-01 NOTE — Patient Instructions (Signed)
 Increase bromocriptine  2.5 mg, 1 tablet at bedtime daily

## 2024-03-01 NOTE — Progress Notes (Unsigned)
 Referring Physician:  Sherial Bail, MD [address] [place],  [place] [address]  Primary Physician:  Sherial Bail, MD  History of Present Illness: 03/03/2024 Ms. Breanna Ramos is here today with a chief complaint of back and leg Ramos.  She had surgery in 2019,and has had worsening Ramos over the past few months.  She tried PT but her Ramos worsened.   The symptoms are causing a significant impact on the patient's life.   I have utilized the care everywhere function in epic to review the outside records available from external health systems.  Progress Note from [place], [place] on 02/04/24:  History of Present Illness: 02/04/2024 Ms. Breanna Ramos has a history of HTN, PSVT, OSA with CPAP, DM, chronic Ramos, and TIA with cerebral venous sinus thrombosis.    History of fusion L3-L4 in 2019.   Last seen by me on 06/29/23 for back and right leg Ramos. CT showed pseudoarthrosis L3-L4 with multilevel lumbar spondylosis and facet hypertrophy. No compression seen on CT.    She has been going to PT at [facility]. She was to continue with medications from [facility]    She is here for follow up.    She is doing worse. She has constant LBP, left > right, with right posterior leg Ramos to her mid thigh. No left leg Ramos. Ramos is worse with standing, walking, and any activity. She's had numbness in both legs since her surgery. No new tingling in her legs. She has weakness in right leg.    Had to leave PT yesterday due to Ramos.    She sees Probation officer in [place]. She is on percocet, neurontin , butrans, and flexeril .    She smokes 10 cigarettes a day x 20 years. She is working on quitting.    Bowel/Bladder Dysfunction: none. She has known UC. She has bowel urgency.      Conservative measures:  Physical therapy: PT at [facility] currently- has done 3 visits.  Multimodal medical therapy including regular antiinflammatories: flexeril , prednisone , Celebrex, Percocet,  Trazodone, Gabapentin  (sees Ramos Management [facility] in [place]). Injections:  caudal ESI 06/06/22 with no relief    Past Surgery:   Lumbar fusion L3-L4 2019 Cervical spine surgery 2013  Review of Systems:  A 10 point review of systems is negative, except for the pertinent positives and negatives detailed in the HPI.  Past Medical History: Past Medical History:  Diagnosis Date   Acute focal neurologic deficit with complete resolution    Anxiety    Arthritis    Carpal tunnel syndrome    Cerebral venous sinus thrombosis    Cerebral venous sinus thrombosis    Chronic, continuous use of opioids    Depression    Diabetes mellitus without complication (HCC)    Fibromyalgia    GERD (gastroesophageal reflux disease)    Grade I diastolic dysfunction    H/O cardiac radiofrequency ablation    History of rectal bleeding 08/2020   Hypertension    Iron  deficiency anemia    Kidney stone 11/23/2022   ? in which side pt stated   Microprolactinoma (HCC) 2016   Nicotine  dependence    Normocytic anemia    On long term drug therapy    OSA on CPAP    Pituitary tumor    PSVT (paroxysmal supraventricular tachycardia)    None since ablation   Pulsatile tinnitus    sporadic   Rectal bleeding    SBO (small bowel obstruction) (HCC)  Stroke [facility]) 07/02/2021   Some short term memory issues    Past Surgical History: Past Surgical History:  Procedure Laterality Date   ANKLE ARTHROSCOPY Left 01/29/2023   Procedure: 29898 - ARTHROSCOPY DEBRIDEMENT EXTENSIVE;  Surgeon: Lennie Barter, DPM;  Location: [facility];  Service: Orthopedics/Podiatry;  Laterality: Left;   BACK SURGERY     x 2   BONE EXCISION Left 01/29/2023   Procedure: 72359 - EXCISION BONE SPUR TIBIA;  Surgeon: Lennie Barter, DPM;  Location: [facility];  Service: Orthopedics/Podiatry;  Laterality: Left;   CARDIAC ELECTROPHYSIOLOGY STUDY AND ABLATION     CESAREAN SECTION     CHOLECYSTECTOMY     COLONOSCOPY WITH  PROPOFOL  N/A 03/07/2020   Procedure: COLONOSCOPY WITH PROPOFOL ;  Surgeon: Unk Corinn Skiff, MD;  Location: [facility] END[facility]COPY;  Service: Gastroenterology;  Laterality: N/A;   CYSTOSCOPY N/A 12/03/2022   Procedure: CYSTOSCOPY;  Surgeon: Leonce Garnette BIRCH, MD;  Location: [facility] ORS;  Service: Gynecology;  Laterality: N/A;   FRACTURE SURGERY     Left Ankle   MINOR HARDWARE REMOVAL Left 01/29/2023   Procedure: MINOR HARDWARE REMOVAL;  Surgeon: Lennie Barter, DPM;  Location: [facility];  Service: Orthopedics/Podiatry;  Laterality: Left;  REMOVED 3 SCREWS FROM LEFT ANKLE.  DISCARDED.   ROBOTIC ASSISTED LAPAROSCOPIC HYSTERECTOMY AND SALPINGECTOMY Bilateral 12/03/2022   Procedure: XI ROBOTIC ASSISTED LAPAROSCOPIC HYSTERECTOMY AND SALPINGECTOMY;  Surgeon: Leonce Garnette BIRCH, MD;  Location: [facility] ORS;  Service: Gynecology;  Laterality: Bilateral;    Allergies: Allergies as of 03/03/2024 - Review Complete 03/03/2024  Allergen Reaction Noted   Celecoxib Other (See Comments) 06/18/2023   Silicone Other (See Comments) 01/19/2023   Tape  01/19/2023   Codeine Other (See Comments) 03/07/2019    Medications:  Current Outpatient Medications:    ascorbic acid (VITAMIN C) 500 MG tablet, Take 500 mg by mouth daily. , Disp: , Rfl:    atorvastatin  (LIPITOR ) 80 MG tablet, Take 0.5 tablets (40 mg total) by mouth at bedtime., Disp: , Rfl:    bromocriptine  (PARLODEL ) 2.5 MG tablet, Take 1 tablet (2.5 mg total) by mouth at bedtime., Disp: 90 tablet, Rfl: 2   buprenorphine (BUTRANS) 10 MCG/HR PTWK, Place 1 patch onto the skin once a week., Disp: , Rfl:    cyclobenzaprine  (FLEXERIL ) 10 MG tablet, Take 10 mg by mouth 3 (three) times daily as needed for muscle spasms., Disp: , Rfl:    diltiazem (TIAZAC) 240 MG 24 hr capsule, Take 240 mg by mouth daily. , Disp: , Rfl:    estradiol  (ESTRACE ) 0.1 MG/GM vaginal cream, Place 0.5 g vaginally., Disp: , Rfl:    famciclovir (FAMVIR) 500 MG tablet, Take 500 mg by mouth every  8 (eight) hours as needed., Disp: , Rfl:    FLUoxetine  (PROZAC ) 20 MG tablet, Take 60 mg by mouth daily., Disp: , Rfl:    gabapentin  (NEURONTIN ) 300 MG capsule, Take 300 mg by mouth at bedtime., Disp: , Rfl:    lamoTRIgine (LAMICTAL) 200 MG tablet, Take 200 mg by mouth daily., Disp: , Rfl:    lamoTRIgine (LAMICTAL) 25 MG tablet, Take 50 mg by mouth at bedtime., Disp: , Rfl:    loratadine (CLARITIN) 10 MG tablet, Take 10 mg by mouth daily. , Disp: , Rfl:    losartan (COZAAR) 50 MG tablet, Take 1 tablet (50 mg total) by mouth once daily as needed If your top BP number >145, Disp: , Rfl:    Magnesium  Oxide 500 MG TABS, Take 1 tablet by mouth  at bedtime., Disp: , Rfl:    mesalamine  (LIALDA ) 1.2 g EC tablet, Take 1.2 g by mouth in the morning, at noon, in the evening, and at bedtime., Disp: , Rfl:    metFORMIN (GLUCOPHAGE) 500 MG tablet, Take 500-1,000 mg by mouth 2 (two) times daily with a meal. 100 mg qam and 500 mg qpm, Disp: , Rfl:    Multiple Vitamin (MULTI-VITAMIN) tablet, Take 1 tablet by mouth at bedtime., Disp: , Rfl:    ondansetron  (ZOFRAN ) 8 MG tablet, Take 8 mg by mouth as needed., Disp: , Rfl:    ondansetron  (ZOFRAN -ODT) 8 MG disintegrating tablet, Take 8 mg by mouth every 8 (eight) hours as needed for nausea or vomiting., Disp: , Rfl:    oxyCODONE  (ROXICODONE ) 15 MG immediate release tablet, Take 15 mg by mouth 4 (four) times daily., Disp: , Rfl:    pantoprazole  (PROTONIX ) 40 MG tablet, Take 40 mg by mouth 2 (two) times daily., Disp: , Rfl:    propranolol (INDERAL) 10 MG tablet, Take 10 mg by mouth 2 (two) times daily., Disp: , Rfl:    propranolol (INDERAL) 20 MG tablet, Take 20 mg by mouth 2 (two) times daily., Disp: , Rfl:    traZODone (DESYREL) 100 MG tablet, Take 200 mg by mouth at bedtime., Disp: , Rfl:    varenicline (CHANTIX) 1 MG tablet, Take 1 mg by mouth daily., Disp: , Rfl:   Social History: Social History   Tobacco Use   Smoking status: Every Day    Current packs/day:  0.25    Average packs/day: 0.3 packs/day for 25.8 years (6.4 ttl pk-yrs)    Types: Cigarettes    Start date: 05/26/1998   Smokeless tobacco: Never  Vaping Use   Vaping status: Never Used  Substance Use Topics   Alcohol use: Yes    Comment: ~1 beer/week   Drug use: Never    Family Medical History: Family History  Problem Relation Age of Onset   Stroke Mother    Diabetes Mother    Heart disease Mother    Breast cancer Mother 3   Stroke Father     Physical Examination: Vitals:   03/03/24 1007  BP: (!) 140/88    General: Patient is in no apparent distress. Attention to examination is appropriate.  Neck:   Supple.  Full range of motion.  Respiratory: Patient is breathing without any difficulty.   NEUROLOGICAL:     Awake, alert, oriented to person, place, and time.  Speech is clear and fluent.   Cranial Nerves: appear intact  Strength: MAEW   Gait shows R leg Ramos - uses cane    Medical Decision Making  Imaging: L spine Ct 06/18/2023 IMPRESSION: 1. Status post PLIF at L3-4 without residual or recurrent stenosis. 2. The right L4 pedicle screw terminates lateral to the vertebral body. 3. No significant bridging bone is present across the L3-4 disc. The fusion is incomplete. 4. Mild disc bulging and facet hypertrophy at L2-3 and L4-5 without significant stenosis. 5. Mild facet hypertrophy at L5-S1 without significant stenosis. 6. 12 mm nonobstructing stone at the lower pole of the left kidney. 7. Two punctate nonobstructing stones at the upper pole of the left kidney.     Electronically Signed   By: Lonni Necessary M.D.   On: 06/18/2023 14:59  L spine scoliosis xrays 02/11/2024 IMPRESSION: 1. Mild scoliosis, maximal curvature 10 degrees in the thoracic spine, 15 degrees at the upper lumbar dextroconvex scoliosis with apex at L1-L2. 2.  Prior cervical ACDF with evidence of solid interbody arthrodesis. Adjacent segment disease. L3-L4 lumbar spinal  fusion hardware. Advanced lumbar disc and endplate degeneration at L1-L2. 3.  No acute osseous abnormality identified.     Electronically Signed   By: VEAR Hurst M.D.   On: 02/14/2024 11:32   I have personally reviewed the images and agree with the above interpretation.  Assessment and Plan: Ms. Sauseda is a pleasant 53 y.o. female with worsening lumbar spine coronal plane deformity.  On her abdominal x-rays from 2021, she had only a 3 degree curvature between L1 and L4.  This is now 15 degrees.  She has worsening back Ramos.  On review of her CT scan, there is evidence of pseudoarthrosis at L3-4.  Additionally, the left L3 screw is placed across the L2-3 facet joint.  The right L4 screw is lateral to the pedicle.  I do not think that further conservative management is indicated.  She will quit smoking and then we will discuss L2-5 intervention with open transforaminal lumbar interbody fusion to try to achieve fusion and correct her coronal plane abnormality.  She will contact us  after she has been off nicotine  products for 30 days.  I spent a total of 10 minutes in this patient's care today. This time was spent reviewing pertinent records including imaging studies, obtaining and confirming history, performing a directed evaluation, formulating and discussing my recommendations, and documenting the visit within the medical record.    Thank you for involving me in the care of this patient.      Ector Laurel K. Clois MD, [facility] Neurosurgery

## 2024-03-02 ENCOUNTER — Inpatient Hospital Stay: Attending: Oncology

## 2024-03-02 DIAGNOSIS — D509 Iron deficiency anemia, unspecified: Secondary | ICD-10-CM | POA: Diagnosis present

## 2024-03-02 DIAGNOSIS — Z86718 Personal history of other venous thrombosis and embolism: Secondary | ICD-10-CM | POA: Diagnosis not present

## 2024-03-02 DIAGNOSIS — Z803 Family history of malignant neoplasm of breast: Secondary | ICD-10-CM | POA: Insufficient documentation

## 2024-03-02 DIAGNOSIS — F17210 Nicotine dependence, cigarettes, uncomplicated: Secondary | ICD-10-CM | POA: Insufficient documentation

## 2024-03-02 LAB — CBC WITH DIFFERENTIAL/PLATELET
Abs Immature Granulocytes: 0.02 K/uL (ref 0.00–0.07)
Basophils Absolute: 0 K/uL (ref 0.0–0.1)
Basophils Relative: 0 %
Eosinophils Absolute: 0.4 K/uL (ref 0.0–0.5)
Eosinophils Relative: 5 %
HCT: 39.9 % (ref 36.0–46.0)
Hemoglobin: 13.4 g/dL (ref 12.0–15.0)
Immature Granulocytes: 0 %
Lymphocytes Relative: 22 %
Lymphs Abs: 1.9 K/uL (ref 0.7–4.0)
MCH: 31.1 pg (ref 26.0–34.0)
MCHC: 33.6 g/dL (ref 30.0–36.0)
MCV: 92.6 fL (ref 80.0–100.0)
Monocytes Absolute: 0.7 K/uL (ref 0.1–1.0)
Monocytes Relative: 8 %
Neutro Abs: 5.6 K/uL (ref 1.7–7.7)
Neutrophils Relative %: 65 %
Platelets: 339 K/uL (ref 150–400)
RBC: 4.31 MIL/uL (ref 3.87–5.11)
RDW: 12.5 % (ref 11.5–15.5)
WBC: 8.7 K/uL (ref 4.0–10.5)
nRBC: 0 % (ref 0.0–0.2)

## 2024-03-02 LAB — IRON AND TIBC
Iron: 76 ug/dL (ref 28–170)
Saturation Ratios: 24 % (ref 10.4–31.8)
TIBC: 319 ug/dL (ref 250–450)
UIBC: 243 ug/dL

## 2024-03-02 LAB — FERRITIN: Ferritin: 27 ng/mL (ref 11–307)

## 2024-03-03 ENCOUNTER — Ambulatory Visit: Admitting: Neurosurgery

## 2024-03-03 ENCOUNTER — Encounter: Payer: Self-pay | Admitting: Neurosurgery

## 2024-03-03 VITALS — BP 140/88 | Ht 66.0 in | Wt 211.0 lb

## 2024-03-03 DIAGNOSIS — M438X9 Other specified deforming dorsopathies, site unspecified: Secondary | ICD-10-CM | POA: Diagnosis not present

## 2024-03-03 DIAGNOSIS — S32009K Unspecified fracture of unspecified lumbar vertebra, subsequent encounter for fracture with nonunion: Secondary | ICD-10-CM

## 2024-03-04 ENCOUNTER — Encounter: Payer: Self-pay | Admitting: Oncology

## 2024-03-04 ENCOUNTER — Inpatient Hospital Stay (HOSPITAL_BASED_OUTPATIENT_CLINIC_OR_DEPARTMENT_OTHER): Admitting: Oncology

## 2024-03-04 ENCOUNTER — Inpatient Hospital Stay

## 2024-03-04 VITALS — BP 125/80 | HR 89 | Temp 97.9°F | Resp 20 | Ht 66.0 in | Wt 208.0 lb

## 2024-03-04 DIAGNOSIS — D509 Iron deficiency anemia, unspecified: Secondary | ICD-10-CM | POA: Diagnosis not present

## 2024-03-04 NOTE — Progress Notes (Signed)
 [facility]  [phone] [phone]  ID: Breanna Ramos OB: 1971-05-06  MR#: [id number]  RDW#:[id number]  Patient Care Team: Sherial Bail, MD as PCP - General (Internal Medicine) Jacobo Evalene PARAS, MD as Consulting Physician (Hematology and Oncology)   CHIEF COMPLAINT: Iron  deficiency anemia.  INTERVAL HISTORY: Patient returns to clinic today for repeat laboratory work, further evaluation, consideration of additional IV Venofer .  She continues to have chronic back pain, but states she is having surgery in the near future once she quit smoking.  She otherwise feels well and is asymptomatic.  She has no new neurologic complaints today.  She denies any recent fevers or illnesses.  She has a good appetite and denies weight loss.  She has no chest pain, shortness of breath, cough, or hemoptysis.  She denies any nausea, vomiting, constipation, or diarrhea.  She has no urinary complaints.  Patient offers no further specific complaints today.  REVIEW OF SYSTEMS:   Review of Systems  Constitutional: Negative.  Negative for fever, malaise/fatigue and weight loss.  Respiratory: Negative.  Negative for cough, hemoptysis and shortness of breath.   Cardiovascular: Negative.  Negative for chest pain and leg swelling.  Gastrointestinal: Negative.  Negative for abdominal pain.  Genitourinary: Negative.  Negative for dysuria.  Musculoskeletal:  Positive for back pain. Negative for joint pain.  Skin: Negative.  Negative for rash.  Neurological: Negative.  Negative for dizziness, tingling, sensory change, speech change, focal weakness, weakness and headaches.  Psychiatric/Behavioral:  The patient is not nervous/anxious.     As per HPI. Otherwise, a complete review of systems is negative.  PAST MEDICAL HISTORY: Past Medical History:  Diagnosis Date   Acute focal neurologic deficit with complete resolution    Anxiety    Arthritis    Carpal tunnel syndrome     Cerebral venous sinus thrombosis    Cerebral venous sinus thrombosis    Chronic, continuous use of opioids    Depression    Diabetes mellitus without complication (HCC)    Fibromyalgia    GERD (gastroesophageal reflux disease)    Grade I diastolic dysfunction    H/O cardiac radiofrequency ablation    History of rectal bleeding 08/2020   Hypertension    Iron  deficiency anemia    Kidney stone 11/23/2022   ? in which side pt stated   Microprolactinoma (HCC) 2016   Nicotine  dependence    Normocytic anemia    On long term drug therapy    OSA on CPAP    Pituitary tumor    PSVT (paroxysmal supraventricular tachycardia)    None since ablation   Pulsatile tinnitus    sporadic   Rectal bleeding    SBO (small bowel obstruction) (HCC)    Stroke (HCC) 07/02/2021   Some short term memory issues    PAST SURGICAL HISTORY: Past Surgical History:  Procedure Laterality Date   ANKLE ARTHROSCOPY Left 01/29/2023   Procedure: 29898 - ARTHROSCOPY DEBRIDEMENT EXTENSIVE;  Surgeon: Lennie Barter, DPM;  Location: [facility];  Service: Orthopedics/Podiatry;  Laterality: Left;   BACK SURGERY     x 2   BONE EXCISION Left 01/29/2023   Procedure: 72359 - EXCISION BONE SPUR TIBIA;  Surgeon: Lennie Barter, DPM;  Location: [facility];  Service: Orthopedics/Podiatry;  Laterality: Left;   CARDIAC ELECTROPHYSIOLOGY STUDY AND ABLATION     CESAREAN SECTION     CHOLECYSTECTOMY     COLONOSCOPY WITH PROPOFOL  N/A 03/07/2020   Procedure: COLONOSCOPY WITH PROPOFOL ;  Surgeon: Unk,  Corinn Skiff, MD;  Location: [facility];  Service: Gastroenterology;  Laterality: N/A;   CYSTOSCOPY N/A 12/03/2022   Procedure: CYSTOSCOPY;  Surgeon: Leonce Garnette BIRCH, MD;  Location: [facility];  Service: Gynecology;  Laterality: N/A;   FRACTURE SURGERY     Left Ankle   MINOR HARDWARE REMOVAL Left 01/29/2023   Procedure: MINOR HARDWARE REMOVAL;  Surgeon: Lennie Barter, DPM;  Location: [facility];  Service:  Orthopedics/Podiatry;  Laterality: Left;  REMOVED 3 SCREWS FROM LEFT ANKLE.  DISCARDED.   ROBOTIC ASSISTED LAPAROSCOPIC HYSTERECTOMY AND SALPINGECTOMY Bilateral 12/03/2022   Procedure: XI ROBOTIC ASSISTED LAPAROSCOPIC HYSTERECTOMY AND SALPINGECTOMY;  Surgeon: Leonce Garnette BIRCH, MD;  Location: [facility];  Service: Gynecology;  Laterality: Bilateral;    FAMILY HISTORY: Family History  Problem Relation Age of Onset   Stroke Mother    Diabetes Mother    Heart disease Mother    Breast cancer Mother 48   Stroke Father     ADVANCED DIRECTIVES (Y/N):  N  HEALTH MAINTENANCE: Social History   Tobacco Use   Smoking status: Every Day    Current packs/day: 0.25    Average packs/day: 0.3 packs/day for 25.8 years (6.4 ttl pk-yrs)    Types: Cigarettes    Start date: 05/26/1998   Smokeless tobacco: Never  Vaping Use   Vaping status: Never Used  Substance Use Topics   Alcohol use: Yes    Comment: ~1 beer/week   Drug use: Never     Colonoscopy:  PAP:  Bone density:  Lipid panel:  Allergies  Allergen Reactions   Celecoxib Other (See Comments)    NSAIDs are contraindicated d/t ulcerative colitis   Silicone Other (See Comments)    Skin tear with tape removal after hysterectomy on face.   Tape     Skin tear with tape removal after hysterectomy   Codeine Other (See Comments)    Cough syrup with codeine caused hyperactivity    Current Outpatient Medications  Medication Sig Dispense Refill   ascorbic acid (VITAMIN C) 500 MG tablet Take 500 mg by mouth daily.      atorvastatin  (LIPITOR ) 80 MG tablet Take 0.5 tablets (40 mg total) by mouth at bedtime.     bromocriptine  (PARLODEL ) 2.5 MG tablet Take 1 tablet (2.5 mg total) by mouth at bedtime. 90 tablet 2   buprenorphine (BUTRANS) 10 MCG/HR PTWK Place 1 patch onto the skin once a week.     cyclobenzaprine  (FLEXERIL ) 10 MG tablet Take 10 mg by mouth 3 (three) times daily as needed for muscle spasms.     diltiazem (TIAZAC) 240 MG 24 hr  capsule Take 240 mg by mouth daily.      estradiol  (ESTRACE ) 0.1 MG/GM vaginal cream Place 0.5 g vaginally.     famciclovir (FAMVIR) 500 MG tablet Take 500 mg by mouth every 8 (eight) hours as needed.     FLUoxetine  (PROZAC ) 20 MG tablet Take 60 mg by mouth daily.     gabapentin  (NEURONTIN ) 300 MG capsule Take 300 mg by mouth at bedtime.     lamoTRIgine (LAMICTAL) 200 MG tablet Take 200 mg by mouth daily.     lamoTRIgine (LAMICTAL) 25 MG tablet Take 50 mg by mouth at bedtime.     loratadine (CLARITIN) 10 MG tablet Take 10 mg by mouth daily.      losartan (COZAAR) 50 MG tablet Take 1 tablet (50 mg total) by mouth once daily as needed If your top BP number >145     Magnesium   Oxide 500 MG TABS Take 1 tablet by mouth at bedtime.     mesalamine  (LIALDA ) 1.2 g EC tablet Take 1.2 g by mouth in the morning, at noon, in the evening, and at bedtime.     metFORMIN (GLUCOPHAGE) 500 MG tablet Take 500-1,000 mg by mouth 2 (two) times daily with a meal. 100 mg qam and 500 mg qpm     Multiple Vitamin (MULTI-VITAMIN) tablet Take 1 tablet by mouth at bedtime.     ondansetron  (ZOFRAN ) 8 MG tablet Take 8 mg by mouth as needed.     ondansetron  (ZOFRAN -ODT) 8 MG disintegrating tablet Take 8 mg by mouth every 8 (eight) hours as needed for nausea or vomiting.     oxyCODONE  (ROXICODONE ) 15 MG immediate release tablet Take 15 mg by mouth 4 (four) times daily.     pantoprazole  (PROTONIX ) 40 MG tablet Take 40 mg by mouth 2 (two) times daily.     propranolol (INDERAL) 10 MG tablet Take 10 mg by mouth 2 (two) times daily.     propranolol (INDERAL) 20 MG tablet Take 20 mg by mouth 2 (two) times daily.     traZODone (DESYREL) 100 MG tablet Take 200 mg by mouth at bedtime.     varenicline (CHANTIX) 1 MG tablet Take 1 mg by mouth daily.     No current facility-administered medications for this visit.    OBJECTIVE: Vitals:   03/04/24 1131  BP: 125/80  Pulse: 89  Resp: 20  Temp: 97.9 F (36.6 C)  SpO2: 96%        Body mass index is 33.57 kg/m.    ECOG FS:0 - Asymptomatic  General: Well-developed, well-nourished, no acute distress. Eyes: Pink conjunctiva, anicteric sclera. HEENT: Normocephalic, moist mucous membranes. Lungs: No audible wheezing or coughing. Heart: Regular rate and rhythm. Abdomen: Soft, nontender, no obvious distention. Musculoskeletal: No edema, cyanosis, or clubbing. Neuro: Alert, answering all questions appropriately. Cranial nerves grossly intact. Skin: No rashes or petechiae noted. Psych: Normal affect.  LAB RESULTS:  Lab Results  Component Value Date   NA 137 11/23/2022   K 4.1 11/23/2022   CL 102 11/23/2022   CO2 26 11/23/2022   GLUCOSE 93 11/23/2022   BUN 18 11/23/2022   CREATININE 0.55 11/23/2022   CALCIUM  8.8 (L) 11/23/2022   PROT 6.7 11/23/2022   ALBUMIN 4.0 11/23/2022   AST 37 11/23/2022   ALT 49 (H) 11/23/2022   ALKPHOS 94 11/23/2022   BILITOT 0.5 11/23/2022   GFRNONAA >60 11/23/2022   GFRAA >60 02/15/2020    Lab Results  Component Value Date   WBC 8.7 03/02/2024   NEUTROABS 5.6 03/02/2024   HGB 13.4 03/02/2024   HCT 39.9 03/02/2024   MCV 92.6 03/02/2024   PLT 339 03/02/2024   Lab Results  Component Value Date   IRON  76 03/02/2024   TIBC 319 03/02/2024   IRONPCTSAT 24 03/02/2024   Lab Results  Component Value Date   FERRITIN 27 03/02/2024     STUDIES: MR LUMBAR SPINE WO CONTRAST Result Date: 02/14/2024 CLINICAL DATA:  53 year old female with low back pain, lumbar spondylosis, prior spine surgery, scoliosis. EXAM: MRI LUMBAR SPINE WITHOUT CONTRAST TECHNIQUE: Multiplanar, multisequence MR imaging of the lumbar spine was performed. No intravenous contrast was administered. COMPARISON:  Scoliosis series and lumbar radiographs reported separately today. Lumbar spine CT 06/18/2023. Lumbar MRI 03/14/2022. FINDINGS: Segmentation: Normal on the comparison study a, the same numbering system used previously. Alignment: Straightening of lumbar  lordosis with mild mostly  upper lumbar dextroconvex scoliosis as detailed separately today. Alignment appears stable since 2023. Vertebrae: Normal background bone marrow signal. Maintained vertebral height. Chronic spinal fusion hardware susceptibility artifact at L3-L4. Evidence now of degenerative marrow edema at the anterior superior L3 endplate which is new since 2023. And minimal degenerative appearing endplate marrow edema at L4-L5 asymmetric to the right. Intact visible sacrum and SI joints and no other marrow edema or acute osseous abnormality. Conus medullaris and cauda equina: Conus extends to the T12-L1 level. No lower spinal cord or conus signal abnormality. Paraspinal and other soft tissues: Negative; minimal postoperative changes to the posterior lumbar paraspinal soft tissues. Disc levels: T11-T12 and T12-L1:  Negative. L1-L2: Disc space loss with mostly anterior disc bulging and endplate spurring. No stenosis. L2-L3: Mild circumferential disc bulge. Moderate left facet hypertrophy, mild facet and ligament flavum hypertrophy otherwise. No stenosis. This level appears mildly progressed since 2023. L3-L4:  Stable decompression and fusion appearance. L4-L5: Mostly preserved disc space. Far lateral disc bulging and endplate spurring which now appears asymmetric to the right and progressed there since 2023 (series 8, image 24). Mild right side posterior element hypertrophy. Capacious spinal canal. No spinal or lateral recess stenosis. Moderate right L4 neural foraminal stenosis appears increased. L5-S1: Negative disc. Moderate facet hypertrophy greater on the right. No stenosis. IMPRESSION: 1. L3-L4 decompression and fusion appears stable. 2. Adjacent segment disease at L2-L3 appears mildly progressed since 2023 but with no associated stenosis. Adjacent segment disease at L4-L5 with progressed right far lateral disc osteophyte complex, increased and moderate right neural foraminal stenosis. Query Right L4  radiculitis. 3. L1-L2 disc and endplate degeneration without stenosis. Electronically Signed   By: VEAR Hurst M.D.   On: 02/14/2024 11:50   DG Lumbar Spine Complete Result Date: 02/14/2024 CLINICAL DATA:  53 year old female with low back pain, lumbar spondylosis, prior spine surgery, scoliosis. EXAM: LUMBAR SPINE - COMPLETE 4+ VIEW COMPARISON:  Scoliosis series reported separately today. FINDINGS: Mostly dextroconvex lumbar scoliosis on these 4 images, stable to that detailed separately today. Associated straightening of lumbar lordosis. Chronic L3-L4 posterior and interbody fusion sequelae. Chronic disc and endplate degeneration elsewhere maximal at L1-L2. Lateral views in neutral, flexion, and extension positioning demonstrate decreased range of motion in the lumbar spine, no abnormal motion. Intact visible sacrum and SI joints. No acute osseous abnormality identified. Stable cholecystectomy clips. IMPRESSION: 1. Chronic L3-L4 posterior and interbody fusion. Decreased range of motion in the lumbar spine and no abnormal motion on flexion/extension. 2. Mostly dextroconvex lumbar scoliosis on these images, see scoliosis series details reported separately. Electronically Signed   By: VEAR Hurst M.D.   On: 02/14/2024 11:40   DG SCOLIOSIS EVAL COMPLETE SPINE 2 OR 3 VIEWS Result Date: 02/14/2024 CLINICAL DATA:  53 year old female with low back pain, lumbar spondylosis, prior spine surgery, scoliosis. EXAM: DG SCOLIOSIS EVAL COMPLETE SPINE 2-3V COMPARISON:  Thoracic and lumbar spine CT 06/18/2023. FINDINGS: Ten radiographic images. Lower cervical ACDF hardware in place through C7. Bulky adjacent segment disease and similar disc and endplate degeneration at C4-C5 and C3-C4. Evidence of associated lower cervical interbody arthrodesis. Normal thoracic segmentation, as demonstrated by CT in January. Mild thoracic scoliosis, levoconvex upper thoracic curvature at T2-T3, mild dextroconvex curvature at T5-T6, levoconvex  curvature at T9-T10. Maximal associated spinal curvature is 10 degrees. Stable straightening of thoracic kyphosis. Maintained vertebral height. Widespread thoracic degenerative endplate spurring. Lumbar segmentation is normal. Previous L3-L4 posterior and interbody fusion. Dextroconvex upper and mild levoconvex lower lumbar scoliosis. Maximal lumbar  scoliotic curvature is 15 degrees (apex at L1-L2). Stable straightening of lumbar lordosis. Chronic degenerative endplate spurring most pronounced at L1-L2. No acute osseous abnormality identified. Cholecystectomy clips. Negative visible chest, abdominal, pelvic visceral contours. IMPRESSION: 1. Mild scoliosis, maximal curvature 10 degrees in the thoracic spine, 15 degrees at the upper lumbar dextroconvex scoliosis with apex at L1-L2. 2. Prior cervical ACDF with evidence of solid interbody arthrodesis. Adjacent segment disease. L3-L4 lumbar spinal fusion hardware. Advanced lumbar disc and endplate degeneration at L1-L2. 3.  No acute osseous abnormality identified. Electronically Signed   By: VEAR Hurst M.D.   On: 02/14/2024 11:32    ASSESSMENT: Iron  deficiency anemia.  PLAN:    Iron  deficiency anemia: Resolved.  Patient's hemoglobin and iron  stores continue to be within normal limits.  She does not require additional treatment today.  She last received IV Venofer  on November 04, 2022.  No further intervention is needed.  No further follow-up has been scheduled.  Please refer patient back if there are any questions or concerns.   History of cerebral sinus thrombosis: Patient has now discontinued anticoagulation.  Previously, CT venogram of the head on July 02, 2021 reviewed independently with a 2.5 cm filling defect in the right transverse sinus consistent with thrombus, although unclear if this was acute or chronic.  Full hypercoagulable workup was negative.  Recommendations for an unprovoked sinus clot is 12 months of treatment and then discontinue.  Patient  expressed understanding that while recurrence is unlikely she has approximately 2 to 7% chance of this happening.  No further intervention is needed.  Patient had DVT or other blood clot would consider lifelong treatment at that time.   Back pain: Continue follow-up and treatment per neurosurgery.  I spent a total of 20 minutes reviewing chart data, face-to-face evaluation with the patient, counseling and coordination of care as detailed above.   Patient expressed understanding and was in agreement with this plan. She also understands that She can call clinic at any time with any questions, concerns, or complaints.    Evalene JINNY Reusing, MD   03/04/2024 11:49 AM

## 2024-03-07 ENCOUNTER — Ambulatory Visit: Payer: Self-pay

## 2024-03-14 ENCOUNTER — Ambulatory Visit: Payer: Self-pay

## 2024-03-20 ENCOUNTER — Other Ambulatory Visit: Payer: Self-pay | Admitting: Internal Medicine

## 2024-03-21 ENCOUNTER — Ambulatory Visit: Payer: Self-pay

## 2024-03-27 ENCOUNTER — Encounter: Payer: Self-pay | Admitting: Neurosurgery

## 2024-03-27 DIAGNOSIS — Z87891 Personal history of nicotine dependence: Secondary | ICD-10-CM

## 2024-03-28 ENCOUNTER — Ambulatory Visit: Payer: Self-pay

## 2024-03-28 NOTE — Telephone Encounter (Signed)
 Called patient to inform her that we ordered a nicotine  test for her. She is agreeable to go get the test on Monday 11/10 and see Dr. Clois on 11/11. (Per CKY, ok for result to be pending for appt). Patient expresses understanding that she has to be nicotine  free 30 days prior to testing

## 2024-04-04 ENCOUNTER — Other Ambulatory Visit
Admission: RE | Admit: 2024-04-04 | Discharge: 2024-04-04 | Disposition: A | Discharge status: Home / Self Care | Attending: Neurosurgery | Admitting: Neurosurgery

## 2024-04-04 ENCOUNTER — Ambulatory Visit: Payer: Self-pay

## 2024-04-04 DIAGNOSIS — Z87891 Personal history of nicotine dependence: Secondary | ICD-10-CM | POA: Diagnosis present

## 2024-04-05 ENCOUNTER — Ambulatory Visit (INDEPENDENT_AMBULATORY_CARE_PROVIDER_SITE_OTHER): Admitting: Neurosurgery

## 2024-04-05 ENCOUNTER — Other Ambulatory Visit: Payer: Self-pay

## 2024-04-05 ENCOUNTER — Encounter: Payer: Self-pay | Admitting: Neurosurgery

## 2024-04-05 VITALS — BP 148/88 | Ht 66.0 in | Wt 208.0 lb

## 2024-04-05 DIAGNOSIS — M438X9 Other specified deforming dorsopathies, site unspecified: Secondary | ICD-10-CM

## 2024-04-05 DIAGNOSIS — Z01818 Encounter for other preprocedural examination: Secondary | ICD-10-CM

## 2024-04-05 DIAGNOSIS — M5416 Radiculopathy, lumbar region: Secondary | ICD-10-CM | POA: Diagnosis not present

## 2024-04-05 DIAGNOSIS — M960 Pseudarthrosis after fusion or arthrodesis: Secondary | ICD-10-CM | POA: Diagnosis not present

## 2024-04-05 DIAGNOSIS — S32009K Unspecified fracture of unspecified lumbar vertebra, subsequent encounter for fracture with nonunion: Secondary | ICD-10-CM

## 2024-04-05 NOTE — Patient Instructions (Signed)
 Please see below for information in regards to your upcoming surgery:   Planned surgery: open L2/3 and L4-5 transforaminal lumbar interbody fusion, L2-5 posterior spinal fusion   Surgery date: 04/27/24 at [facility] (Medical Mall: [address], [place], [place] [address]) - you will find out your arrival time the business day before your surgery.   Pre-op appointment at [facility] Pre-admit Testing: you will receive a call with a date/time for this appointment. If you are scheduled for an in person appointment, Pre-admit Testing is located on the first floor of the [facility] building, [address], [address]. During this appointment, they will advise you which medications you can take the morning of surgery, and which medications you will need to hold for surgery. Labs (such as blood work, EKG) may be done at your pre-op appointment. You are not required to fast for these labs. Should you need to change your pre-op appointment, please call Pre-admit testing at [phone].    *butrans patch - do not restart until after surgery, check with us  regarding time frame before restarting     Diabetes/heart failure/kidney disease/weight loss medications that require an extended hold: Per anesthesia guidelines (due to the increased risk of aspiration caused by delayed gastric emptying):  Metformin: hold for 2 days prior to surgery    Surgical clearance: we will send a clearance form to Dr Sherial. They may wish to see you in their office prior to signing the clearance form. If so, they may call you to schedule an appointment.     NSAIDS (Non-steroidal anti-inflammatory drugs): because you are having a fusion, please avoid taking any NSAIDS (examples: ibuprofen, motrin, aleve, naproxen, meloxicam, diclofenac) for 3 months after surgery. Celebrex is an exception and is OK to take, if prescribed. Tylenol  is not an NSAID.   *No brace needed per Dr  Clois   Common restrictions after spine surgery: No bending, lifting, or twisting ("BLT"). Avoid lifting objects heavier than 10 pounds for the first 6 weeks after surgery. Where possible, avoid household activities that involve lifting, bending, reaching, pushing, or pulling such as laundry, vacuuming, grocery shopping, and childcare. Try to arrange for help from friends and family for these activities while you heal. Do not drive while taking prescription pain medication. Weeks 6 through 12 after surgery: avoid lifting more than 25 pounds.    X-rays after surgery: Because you are having a fusion or arthroplasty: for appointments after your 2 week follow-up: please arrive our office 30 minutes prior to your appointment for x-rays. This applies to every appointment after your 2 week follow-up. Failure to do so may result in your appointment being rescheduled.    How to contact us :  If you have any questions/concerns before or after surgery, you can reach us  at [phone], or you can send a mychart message. We can be reached by phone or mychart 8am-4pm, Monday-Friday.  *Please note: Calls after 4pm are forwarded to a third party answering service. Mychart messages are not routinely monitored during evenings, weekends, and holidays. Please call our office to contact the answering service for urgent concerns during non-business hours.   If you have FMLA/disability paperwork, please drop it off or fax it to [phone]   Appointments/FMLA & disability paperwork: Reche Hait, & Nichole Registered Nurse/Surgery scheduler: Zurich Carreno, RN & Katie, RN Certified Medical Assistants: Don, CMA, Elenor, CMA, Damien, CMA, & Auston, [place] Physician Assistants: Lyle Decamp, PA-C, Edsel Goods, PA-C & Glade Boys, PA-C Surgeons: Penne  Claudene, MD & Reeves Daisy, MD   [facility] PREADMIT TESTING VISIT and SURGERY INFORMATION SHEET   Now that surgery has been scheduled  you can anticipate several phone calls from [facility] services. A pharmacy technician will call you to verify your current list of medications taken at home.               The Pre-Service Center will call to verify your insurance information and to give you billing estimates and information.             The Preadmit Testing Office will be calling to schedule a visit to obtain information for the anesthesia team and provide instructions on preparation for surgery.  What can you expect for the Preadmit Testing Visit: Appointments may be scheduled in-person or by telephone.  If a telephone visit is scheduled, you may be asked to come into the office to have lab tests or other studies performed.   This visit will not be completed any greater than 14 days prior to your surgery.  If your surgery has been scheduled for a future date, please do not be alarmed if we have not contacted you to schedule an appointment more than a month prior to the surgery date.    Please be prepared to provide the following information during this appointment:            -Personal medical history                                               -Medication and allergy list            -Any history of problems with anesthesia              -Recent lab work or diagnostic studies            -Please notify us  of any needs we should be aware of to provide the best care possible           -You will be provided with instructions on how to prepare for your surgery.    On The Day of Surgery:  You must have a driver to take you home after surgery, you will be asked not to drive for 24 hours following surgery.  Taxi, Gisele and non-medical transport will not be acceptable means of transportation unless you have a responsible individual who will be traveling with you.  Visitors in the surgical area:   2 people will be able to visit you in your room once your preparation for surgery has been completed. During surgery, your visitors will be  asked to wait in the Surgery Waiting Area.  It is not a requirement for them to stay, if they prefer to leave and come back.  Your visitor(s) will be given an update once the surgery has been completed.  No visitors are allowed in the initial recovery room to respect patient privacy and safety.  Once you are more awake and transfer to the secondary recovery area, or are transferred to an inpatient room, visitors will again be able to see you.  To respect and protect your privacy: We will ask on the day of surgery who your driver will be and what the contact number for that individual will be. We will ask if it is okay to share information with this individual,  or if there is an alternative individual that we, or the surgeon, should contact to provide updates and information. If family or friends come to the surgical information desk requesting information about you, who you have not listed with us , no information will be given.   It may be helpful to designate someone as the main contact who will be responsible for updating your other friends and family.    PREADMIT TESTING OFFICE: [phone] SAME DAY SURGERY: [phone] We look forward to caring for you before and throughout the process of your surgery.

## 2024-04-05 NOTE — H&P (View-Only) (Signed)
 Referring Physician:  Sherial Bail, MD [address] [place],  [place] [address]  Primary Physician:  Sherial Bail, MD  History of Present Illness: 04/05/2024 Ms. Corsi returns to see me.  She has successfully quit smoking.  03/03/2024 Ms. Charlotta Lapaglia is here today with a chief complaint of back and leg pain.  She had surgery in 2019,and has had worsening pain over the past few months.  She tried PT but her pain worsened.   The symptoms are causing a significant impact on the patient's life.   I have utilized the care everywhere function in epic to review the outside records available from external health systems.  Progress Note from [place] on 02/04/24:  History of Present Illness: 02/04/2024 Ms. Codee Bloodworth has a history of HTN, PSVT, OSA with CPAP, DM, chronic pain, and TIA with cerebral venous sinus thrombosis.    History of fusion L3-L4 in 2019.   Last seen by me on 06/29/23 for back and right leg pain. CT showed pseudoarthrosis L3-L4 with multilevel lumbar spondylosis and facet hypertrophy. No compression seen on CT.    She has been going to PT at [facility]. She was to continue with medications from [facility].    She is here for follow up.    She is doing worse. She has constant LBP, left > right, with right posterior leg pain to her mid thigh. No left leg pain. Pain is worse with standing, walking, and any activity. She's had numbness in both legs since her surgery. No new tingling in her legs. She has weakness in right leg.    Had to leave PT yesterday due to pain.    She sees Probation officer in [place]. She is on percocet, neurontin , butrans, and flexeril .    She smokes 10 cigarettes a day x 20 years. She is working on quitting.    Bowel/Bladder Dysfunction: none. She has known UC. She has bowel urgency.      Conservative measures:  Physical therapy: PT at [facility] currently- has done 3 visits.  Multimodal medical therapy  including regular antiinflammatories: flexeril , prednisone , Celebrex, Percocet, Trazodone, Gabapentin  (sees [facility] in [place]). Injections:  caudal ESI 06/06/22 with no relief    Past Surgery:   Lumbar fusion L3-L4 2019 Cervical spine surgery 2013  Review of Systems:  A 10 point review of systems is negative, except for the pertinent positives and negatives detailed in the HPI.  Past Medical History: Past Medical History:  Diagnosis Date   Acute focal neurologic deficit with complete resolution    Anxiety    Arthritis    Carpal tunnel syndrome    Cerebral venous sinus thrombosis    Cerebral venous sinus thrombosis    Chronic, continuous use of opioids    Depression    Diabetes mellitus without complication (HCC)    Fibromyalgia    GERD (gastroesophageal reflux disease)    Grade I diastolic dysfunction    H/O cardiac radiofrequency ablation    History of rectal bleeding 08/2020   Hypertension    Iron  deficiency anemia    Kidney stone 11/23/2022   ? in which side pt stated   Microprolactinoma (HCC) 2016   Nicotine  dependence    Normocytic anemia    On long term drug therapy    OSA on CPAP    Pituitary tumor    PSVT (paroxysmal supraventricular tachycardia)    None since ablation   Pulsatile tinnitus    sporadic  Rectal bleeding    SBO (small bowel obstruction) (HCC)    Stroke (HCC) 07/02/2021   Some short term memory issues    Past Surgical History: Past Surgical History:  Procedure Laterality Date   ANKLE ARTHROSCOPY Left 01/29/2023   Procedure: 29898 - ARTHROSCOPY DEBRIDEMENT EXTENSIVE;  Surgeon: Lennie Barter, DPM;  Location: [facility];  Service: Orthopedics/Podiatry;  Laterality: Left;   BACK SURGERY     x 2   BONE EXCISION Left 01/29/2023   Procedure: 72359 - EXCISION BONE SPUR TIBIA;  Surgeon: Lennie Barter, DPM;  Location: [facility];  Service: Orthopedics/Podiatry;  Laterality: Left;   CARDIAC ELECTROPHYSIOLOGY STUDY  AND ABLATION     CESAREAN SECTION     CHOLECYSTECTOMY     COLONOSCOPY WITH PROPOFOL  N/A 03/07/2020   Procedure: COLONOSCOPY WITH PROPOFOL ;  Surgeon: Unk Corinn Skiff, MD;  Location: [facility] END[facility]COPY;  Service: Gastroenterology;  Laterality: N/A;   CYSTOSCOPY N/A 12/03/2022   Procedure: CYSTOSCOPY;  Surgeon: Leonce Garnette BIRCH, MD;  Location: [facility] OR[facility];  Service: Gynecology;  Laterality: N/A;   FRACTURE SURGERY     Left Ankle   MINOR HARDWARE REMOVAL Left 01/29/2023   Procedure: MINOR HARDWARE REMOVAL;  Surgeon: Lennie Barter, DPM;  Location: [facility];  Service: Orthopedics/Podiatry;  Laterality: Left;  REMOVED 3 SCREWS FROM LEFT ANKLE.  DISCARDED.   ROBOTIC ASSISTED LAPAROSCOPIC HYSTERECTOMY AND SALPINGECTOMY Bilateral 12/03/2022   Procedure: XI ROBOTIC ASSISTED LAPAROSCOPIC HYSTERECTOMY AND SALPINGECTOMY;  Surgeon: Leonce Garnette BIRCH, MD;  Location: [facility] OR[facility];  Service: Gynecology;  Laterality: Bilateral;    Allergies: Allergies as of 04/05/2024 - Review Complete 04/05/2024  Allergen Reaction Noted   Celecoxib Other (See Comments) 06/18/2023   Silicone Other (See Comments) 01/19/2023   Tape  01/19/2023   Codeine Other (See Comments) 03/07/2019    Medications:  Current Outpatient Medications:    ascorbic acid (VITAMIN C) 500 MG tablet, Take 500 mg by mouth daily. , Disp: , Rfl:    atorvastatin  (LIPITOR ) 80 MG tablet, Take 0.5 tablets (40 mg total) by mouth at bedtime., Disp: , Rfl:    bromocriptine  (PARLODEL ) 2.5 MG tablet, Take 1 tablet (2.5 mg total) by mouth at bedtime., Disp: 90 tablet, Rfl: 2   buprenorphine (BUTRANS) 10 MCG/HR PTWK, Place 1 patch onto the skin once a week., Disp: , Rfl:    cyclobenzaprine  (FLEXERIL ) 10 MG tablet, Take 10 mg by mouth 3 (three) times daily as needed for muscle spasms., Disp: , Rfl:    diltiazem (TIAZAC) 240 MG 24 hr capsule, Take 240 mg by mouth daily. , Disp: , Rfl:    estradiol  (ESTRACE ) 0.1 MG/GM vaginal cream, Place 0.5 g vaginally.,  Disp: , Rfl:    famciclovir (FAMVIR) 500 MG tablet, Take 500 mg by mouth every 8 (eight) hours as needed., Disp: , Rfl:    FLUoxetine  (PROZAC ) 20 MG tablet, Take 60 mg by mouth daily., Disp: , Rfl:    gabapentin  (NEURONTIN ) 300 MG capsule, Take 300 mg by mouth at bedtime., Disp: , Rfl:    lamoTRIgine (LAMICTAL) 200 MG tablet, Take 200 mg by mouth daily., Disp: , Rfl:    lamoTRIgine (LAMICTAL) 25 MG tablet, Take 50 mg by mouth at bedtime., Disp: , Rfl:    loratadine (CLARITIN) 10 MG tablet, Take 10 mg by mouth daily. , Disp: , Rfl:    losartan (COZAAR) 50 MG tablet, Take 1 tablet (50 mg total) by mouth once daily as needed If your top BP number >145, Disp: , Rfl:  Magnesium  Oxide 500 MG TABS, Take 1 tablet by mouth at bedtime., Disp: , Rfl:    mesalamine  (LIALDA ) 1.2 g EC tablet, Take 1.2 g by mouth in the morning, at noon, in the evening, and at bedtime., Disp: , Rfl:    metFORMIN (GLUCOPHAGE) 500 MG tablet, Take 500-1,000 mg by mouth 2 (two) times daily with a meal. 100 mg qam and 500 mg qpm, Disp: , Rfl:    Multiple Vitamin (MULTI-VITAMIN) tablet, Take 1 tablet by mouth at bedtime., Disp: , Rfl:    ondansetron  (ZOFRAN ) 8 MG tablet, Take 8 mg by mouth as needed., Disp: , Rfl:    ondansetron  (ZOFRAN -ODT) 8 MG disintegrating tablet, Take 8 mg by mouth every 8 (eight) hours as needed for nausea or vomiting., Disp: , Rfl:    oxyCODONE -acetaminophen  (PERCOCET) 10-325 MG tablet, SMARTSIG:1.5 Tablet(s) By Mouth 4 Times Daily PRN, Disp: , Rfl:    pantoprazole  (PROTONIX ) 40 MG tablet, Take 40 mg by mouth 2 (two) times daily., Disp: , Rfl:    propranolol (INDERAL) 10 MG tablet, Take 10 mg by mouth 2 (two) times daily., Disp: , Rfl:    propranolol (INDERAL) 20 MG tablet, Take 20 mg by mouth 2 (two) times daily., Disp: , Rfl:    traZODone (DESYREL) 100 MG tablet, Take 200 mg by mouth at bedtime., Disp: , Rfl:   Social History: Social History   Tobacco Use   Smoking status: Every Day    Current  packs/day: 0.25    Average packs/day: 0.3 packs/day for 25.9 years (6.5 ttl pk-yrs)    Types: Cigarettes    Start date: 05/26/1998   Smokeless tobacco: Never  Vaping Use   Vaping status: Never Used  Substance Use Topics   Alcohol use: Yes    Comment: ~1 beer/week   Drug use: Never    Family Medical History: Family History  Problem Relation Age of Onset   Stroke Mother    Diabetes Mother    Heart disease Mother    Breast cancer Mother 21   Stroke Father     Physical Examination: Vitals:   04/05/24 0807  BP: (!) 148/88    General: Patient is in no apparent distress. Attention to examination is appropriate.  Neck:   Supple.  Full range of motion.  Respiratory: Patient is breathing without any difficulty.   NEUROLOGICAL:     Awake, alert, oriented to person, place, and time.  Speech is clear and fluent.   Cranial Nerves: appear intact  Strength: MAEW   Gait shows R leg pain - uses cane    Medical Decision Making  Imaging: L spine Ct 06/18/2023 IMPRESSION: 1. Status post PLIF at L3-4 without residual or recurrent stenosis. 2. The right L4 pedicle screw terminates lateral to the vertebral body. 3. No significant bridging bone is present across the L3-4 disc. The fusion is incomplete. 4. Mild disc bulging and facet hypertrophy at L2-3 and L4-5 without significant stenosis. 5. Mild facet hypertrophy at L5-S1 without significant stenosis. 6. 12 mm nonobstructing stone at the lower pole of the left kidney. 7. Two punctate nonobstructing stones at the upper pole of the left kidney.     Electronically Signed   By: Lonni Necessary M.D.   On: 06/18/2023 14:59  L spine scoliosis xrays 02/11/2024 IMPRESSION: 1. Mild scoliosis, maximal curvature 10 degrees in the thoracic spine, 15 degrees at the upper lumbar dextroconvex scoliosis with apex at L1-L2. 2. Prior cervical ACDF with evidence of solid interbody arthrodesis. Adjacent  segment disease. L3-L4  lumbar spinal fusion hardware. Advanced lumbar disc and endplate degeneration at L1-L2. 3.  No acute osseous abnormality identified.     Electronically Signed   By: VEAR Hurst M.D.   On: 02/14/2024 11:32   I have personally reviewed the images and agree with the above interpretation.  Assessment and Plan: Ms. Crafts is a pleasant 53 y.o. female with worsening lumbar spine coronal plane deformity.  On her abdominal x-rays from 2021, she had only a 3 degree curvature between L1 and L4.  This is now 15 degrees.  She has worsening back pain.  On review of her CT scan, there is evidence of pseudoarthrosis at L3-4.  Additionally, the left L3 screw is placed across the L2-3 facet joint.  The right L4 screw is lateral to the pedicle.  Based on her pseudoarthrosis and an appropriate placement of her right L4 screw as well as her worsening coronal plane deformity, I do not think that conservative management is indicated.  I have recommended moving forward with L2-3 and L4-5 open transforaminal lumbar interbody fusion as well as L3-4 fusion with use of bone morphogenetic protein to try to correct her coronal plane abnormality and her pseudoarthrosis.   I spent a total of 20 minutes in this patient's care today. This time was spent reviewing pertinent records including imaging studies, obtaining and confirming history, performing a directed evaluation, formulating and discussing my recommendations, and documenting the visit within the medical record.    Thank you for involving me in the care of this patient.      Jayceion Lisenby K. Clois MD, [facility] Neurosurgery

## 2024-04-05 NOTE — Progress Notes (Signed)
 Referring Physician:  Sherial Bail, MD [address] [place],  [place] [address]  Primary Physician:  Sherial Bail, MD  History of Present Illness: 04/05/2024 Breanna Ramos returns to see me.  She has successfully quit smoking.  03/03/2024 Breanna Ramos is here today with a chief complaint of back and leg pain.  She had surgery in 2019,and has had worsening pain over the past few months.  She tried PT but her pain worsened.   The symptoms are causing a significant impact on the patient's life.   I have utilized the care everywhere function in epic to review the outside records available from external health systems.  Progress Note from [place] on 02/04/24:  History of Present Illness: 02/04/2024 Breanna Ramos has a history of HTN, PSVT, OSA with CPAP, DM, chronic pain, and TIA with cerebral venous sinus thrombosis.    History of fusion L3-L4 in 2019.   Last seen by me on 06/29/23 for back and right leg pain. CT showed pseudoarthrosis L3-L4 with multilevel lumbar spondylosis and facet hypertrophy. No compression seen on CT.    She has been going to PT at [facility]. She was to continue with medications from [facility].    She is here for follow up.    She is doing worse. She has constant LBP, left > right, with right posterior leg pain to her mid thigh. No left leg pain. Pain is worse with standing, walking, and any activity. She's had numbness in both legs since her surgery. No new tingling in her legs. She has weakness in right leg.    Had to leave PT yesterday due to pain.    She sees Probation officer in [place]. She is on percocet, neurontin , butrans, and flexeril .    She smokes 10 cigarettes a day x 20 years. She is working on quitting.    Bowel/Bladder Dysfunction: none. She has known UC. She has bowel urgency.      Conservative measures:  Physical therapy: PT at [facility] currently- has done 3 visits.  Multimodal medical therapy  including regular antiinflammatories: flexeril , prednisone , Celebrex, Percocet, Trazodone, Gabapentin  (sees [facility] in [place]). Injections:  caudal ESI 06/06/22 with no relief    Past Surgery:   Lumbar fusion L3-L4 2019 Cervical spine surgery 2013  Review of Systems:  A 10 point review of systems is negative, except for the pertinent positives and negatives detailed in the HPI.  Past Medical History: Past Medical History:  Diagnosis Date   Acute focal neurologic deficit with complete resolution    Anxiety    Arthritis    Carpal tunnel syndrome    Cerebral venous sinus thrombosis    Cerebral venous sinus thrombosis    Chronic, continuous use of opioids    Depression    Diabetes mellitus without complication (HCC)    Fibromyalgia    GERD (gastroesophageal reflux disease)    Grade I diastolic dysfunction    H/O cardiac radiofrequency ablation    History of rectal bleeding 08/2020   Hypertension    Iron  deficiency anemia    Kidney stone 11/23/2022   ? in which side pt stated   Microprolactinoma (HCC) 2016   Nicotine  dependence    Normocytic anemia    On long term drug therapy    OSA on CPAP    Pituitary tumor    PSVT (paroxysmal supraventricular tachycardia)    None since ablation   Pulsatile tinnitus    sporadic  Rectal bleeding    SBO (small bowel obstruction) (HCC)    Stroke (HCC) 07/02/2021   Some short term memory issues    Past Surgical History: Past Surgical History:  Procedure Laterality Date   ANKLE ARTHROSCOPY Left 01/29/2023   Procedure: 29898 - ARTHROSCOPY DEBRIDEMENT EXTENSIVE;  Surgeon: Lennie Barter, DPM;  Location: [facility];  Service: Orthopedics/Podiatry;  Laterality: Left;   BACK SURGERY     x 2   BONE EXCISION Left 01/29/2023   Procedure: 72359 - EXCISION BONE SPUR TIBIA;  Surgeon: Lennie Barter, DPM;  Location: [facility];  Service: Orthopedics/Podiatry;  Laterality: Left;   CARDIAC ELECTROPHYSIOLOGY STUDY  AND ABLATION     CESAREAN SECTION     CHOLECYSTECTOMY     COLONOSCOPY WITH PROPOFOL  N/A 03/07/2020   Procedure: COLONOSCOPY WITH PROPOFOL ;  Surgeon: Unk Corinn Skiff, MD;  Location: [facility] END[facility];  Service: Gastroenterology;  Laterality: N/A;   CYSTOSCOPY N/A 12/03/2022   Procedure: CYSTOSCOPY;  Surgeon: Leonce Garnette BIRCH, MD;  Location: [facility] OR[facility];  Service: Gynecology;  Laterality: N/A;   FRACTURE SURGERY     Left Ankle   MINOR HARDWARE REMOVAL Left 01/29/2023   Procedure: MINOR HARDWARE REMOVAL;  Surgeon: Lennie Barter, DPM;  Location: [facility];  Service: Orthopedics/Podiatry;  Laterality: Left;  REMOVED 3 SCREWS FROM LEFT ANKLE.  DISCARDED.   ROBOTIC ASSISTED LAPAROSCOPIC HYSTERECTOMY AND SALPINGECTOMY Bilateral 12/03/2022   Procedure: XI ROBOTIC ASSISTED LAPAROSCOPIC HYSTERECTOMY AND SALPINGECTOMY;  Surgeon: Leonce Garnette BIRCH, MD;  Location: [facility];  Service: Gynecology;  Laterality: Bilateral;    Allergies: Allergies as of 04/05/2024 - Review Complete 04/05/2024  Allergen Reaction Noted   Celecoxib Other (See Comments) 06/18/2023   Silicone Other (See Comments) 01/19/2023   Tape  01/19/2023   Codeine Other (See Comments) 03/07/2019    Medications:  Current Outpatient Medications:    ascorbic acid (VITAMIN C) 500 MG tablet, Take 500 mg by mouth daily. , Disp: , Rfl:    atorvastatin  (LIPITOR ) 80 MG tablet, Take 0.5 tablets (40 mg total) by mouth at bedtime., Disp: , Rfl:    bromocriptine  (PARLODEL ) 2.5 MG tablet, Take 1 tablet (2.5 mg total) by mouth at bedtime., Disp: 90 tablet, Rfl: 2   buprenorphine (BUTRANS) 10 MCG/HR PTWK, Place 1 patch onto the skin once a week., Disp: , Rfl:    cyclobenzaprine  (FLEXERIL ) 10 MG tablet, Take 10 mg by mouth 3 (three) times daily as needed for muscle spasms., Disp: , Rfl:    diltiazem (TIAZAC) 240 MG 24 hr capsule, Take 240 mg by mouth daily. , Disp: , Rfl:    estradiol  (ESTRACE ) 0.1 MG/GM vaginal cream, Place 0.5 g vaginally.,  Disp: , Rfl:    famciclovir (FAMVIR) 500 MG tablet, Take 500 mg by mouth every 8 (eight) hours as needed., Disp: , Rfl:    FLUoxetine  (PROZAC ) 20 MG tablet, Take 60 mg by mouth daily., Disp: , Rfl:    gabapentin  (NEURONTIN ) 300 MG capsule, Take 300 mg by mouth at bedtime., Disp: , Rfl:    lamoTRIgine (LAMICTAL) 200 MG tablet, Take 200 mg by mouth daily., Disp: , Rfl:    lamoTRIgine (LAMICTAL) 25 MG tablet, Take 50 mg by mouth at bedtime., Disp: , Rfl:    loratadine (CLARITIN) 10 MG tablet, Take 10 mg by mouth daily. , Disp: , Rfl:    losartan (COZAAR) 50 MG tablet, Take 1 tablet (50 mg total) by mouth once daily as needed If your top BP number >145, Disp: , Rfl:  Magnesium  Oxide 500 MG TABS, Take 1 tablet by mouth at bedtime., Disp: , Rfl:    mesalamine  (LIALDA ) 1.2 g EC tablet, Take 1.2 g by mouth in the morning, at noon, in the evening, and at bedtime., Disp: , Rfl:    metFORMIN (GLUCOPHAGE) 500 MG tablet, Take 500-1,000 mg by mouth 2 (two) times daily with a meal. 100 mg qam and 500 mg qpm, Disp: , Rfl:    Multiple Vitamin (MULTI-VITAMIN) tablet, Take 1 tablet by mouth at bedtime., Disp: , Rfl:    ondansetron  (ZOFRAN ) 8 MG tablet, Take 8 mg by mouth as needed., Disp: , Rfl:    ondansetron  (ZOFRAN -ODT) 8 MG disintegrating tablet, Take 8 mg by mouth every 8 (eight) hours as needed for nausea or vomiting., Disp: , Rfl:    oxyCODONE -acetaminophen  (PERCOCET) 10-325 MG tablet, SMARTSIG:1.5 Tablet(s) By Mouth 4 Times Daily PRN, Disp: , Rfl:    pantoprazole  (PROTONIX ) 40 MG tablet, Take 40 mg by mouth 2 (two) times daily., Disp: , Rfl:    propranolol (INDERAL) 10 MG tablet, Take 10 mg by mouth 2 (two) times daily., Disp: , Rfl:    propranolol (INDERAL) 20 MG tablet, Take 20 mg by mouth 2 (two) times daily., Disp: , Rfl:    traZODone (DESYREL) 100 MG tablet, Take 200 mg by mouth at bedtime., Disp: , Rfl:   Social History: Social History   Tobacco Use   Smoking status: Every Day    Current  packs/day: 0.25    Average packs/day: 0.3 packs/day for 25.9 years (6.5 ttl pk-yrs)    Types: Cigarettes    Start date: 05/26/1998   Smokeless tobacco: Never  Vaping Use   Vaping status: Never Used  Substance Use Topics   Alcohol use: Yes    Comment: ~1 beer/week   Drug use: Never    Family Medical History: Family History  Problem Relation Age of Onset   Stroke Mother    Diabetes Mother    Heart disease Mother    Breast cancer Mother 21   Stroke Father     Physical Examination: Vitals:   04/05/24 0807  BP: (!) 148/88    General: Patient is in no apparent distress. Attention to examination is appropriate.  Neck:   Supple.  Full range of motion.  Respiratory: Patient is breathing without any difficulty.   NEUROLOGICAL:     Awake, alert, oriented to person, place, and time.  Speech is clear and fluent.   Cranial Nerves: appear intact  Strength: MAEW   Gait shows R leg pain - uses cane    Medical Decision Making  Imaging: L spine Ct 06/18/2023 IMPRESSION: 1. Status post PLIF at L3-4 without residual or recurrent stenosis. 2. The right L4 pedicle screw terminates lateral to the vertebral body. 3. No significant bridging bone is present across the L3-4 disc. The fusion is incomplete. 4. Mild disc bulging and facet hypertrophy at L2-3 and L4-5 without significant stenosis. 5. Mild facet hypertrophy at L5-S1 without significant stenosis. 6. 12 mm nonobstructing stone at the lower pole of the left kidney. 7. Two punctate nonobstructing stones at the upper pole of the left kidney.     Electronically Signed   By: Lonni Necessary M.D.   On: 06/18/2023 14:59  L spine scoliosis xrays 02/11/2024 IMPRESSION: 1. Mild scoliosis, maximal curvature 10 degrees in the thoracic spine, 15 degrees at the upper lumbar dextroconvex scoliosis with apex at L1-L2. 2. Prior cervical ACDF with evidence of solid interbody arthrodesis. Adjacent  segment disease. L3-L4  lumbar spinal fusion hardware. Advanced lumbar disc and endplate degeneration at L1-L2. 3.  No acute osseous abnormality identified.     Electronically Signed   By: VEAR Hurst M.D.   On: 02/14/2024 11:32   I have personally reviewed the images and agree with the above interpretation.  Assessment and Plan: Breanna Ramos is a pleasant 53 y.o. female with worsening lumbar spine coronal plane deformity.  On her abdominal x-rays from 2021, she had only a 3 degree curvature between L1 and L4.  This is now 15 degrees.  She has worsening back pain.  On review of her CT scan, there is evidence of pseudoarthrosis at L3-4.  Additionally, the left L3 screw is placed across the L2-3 facet joint.  The right L4 screw is lateral to the pedicle.  Based on her pseudoarthrosis and an appropriate placement of her right L4 screw as well as her worsening coronal plane deformity, I do not think that conservative management is indicated.  I have recommended moving forward with L2-3 and L4-5 open transforaminal lumbar interbody fusion as well as L3-4 fusion with use of bone morphogenetic protein to try to correct her coronal plane abnormality and her pseudoarthrosis.   I spent a total of 20 minutes in this patient's care today. This time was spent reviewing pertinent records including imaging studies, obtaining and confirming history, performing a directed evaluation, formulating and discussing my recommendations, and documenting the visit within the medical record.    Thank you for involving me in the care of this patient.      Jayceion Lisenby K. Clois MD, [facility] Neurosurgery

## 2024-04-08 ENCOUNTER — Ambulatory Visit: Payer: Self-pay

## 2024-04-08 LAB — NICOTINE/COTININE METABOLITES
Cotinine: <1 ng/mL
Nicotine: <1 ng/mL

## 2024-04-11 ENCOUNTER — Ambulatory Visit: Payer: Self-pay

## 2024-04-19 ENCOUNTER — Encounter
Admission: RE | Admit: 2024-04-19 | Discharge: 2024-04-19 | Disposition: A | Discharge status: Home / Self Care | Source: Ambulatory Visit | Attending: Neurosurgery | Admitting: Neurosurgery

## 2024-04-19 ENCOUNTER — Other Ambulatory Visit: Payer: Self-pay

## 2024-04-19 VITALS — BP 150/92 | HR 72 | Temp 98.0°F | Resp 16 | Ht 65.75 in | Wt 216.9 lb

## 2024-04-19 DIAGNOSIS — M51362 Other intervertebral disc degeneration, lumbar region with discogenic back pain and lower extremity pain: Secondary | ICD-10-CM | POA: Insufficient documentation

## 2024-04-19 DIAGNOSIS — Z01810 Encounter for preprocedural cardiovascular examination: Secondary | ICD-10-CM | POA: Diagnosis not present

## 2024-04-19 DIAGNOSIS — Z01818 Encounter for other preprocedural examination: Secondary | ICD-10-CM | POA: Insufficient documentation

## 2024-04-19 DIAGNOSIS — I10 Essential (primary) hypertension: Secondary | ICD-10-CM | POA: Diagnosis not present

## 2024-04-19 DIAGNOSIS — M438X9 Other specified deforming dorsopathies, site unspecified: Secondary | ICD-10-CM | POA: Diagnosis not present

## 2024-04-19 DIAGNOSIS — E119 Type 2 diabetes mellitus without complications: Secondary | ICD-10-CM

## 2024-04-19 DIAGNOSIS — S32009K Unspecified fracture of unspecified lumbar vertebra, subsequent encounter for fracture with nonunion: Secondary | ICD-10-CM

## 2024-04-19 DIAGNOSIS — M5416 Radiculopathy, lumbar region: Secondary | ICD-10-CM | POA: Insufficient documentation

## 2024-04-19 LAB — TYPE AND SCREEN
ABO/RH(D): A POS
Antibody Screen: NEGATIVE

## 2024-04-19 LAB — URINALYSIS, COMPLETE (UACMP) WITH MICROSCOPIC
Bilirubin Urine: NEGATIVE
Glucose, UA: NEGATIVE mg/dL
Hgb urine dipstick: NEGATIVE
Ketones, ur: NEGATIVE mg/dL
Leukocytes,Ua: NEGATIVE
Nitrite: NEGATIVE
Protein, ur: NEGATIVE mg/dL
Specific Gravity, Urine: 1.013 (ref 1.005–1.030)
pH: 7 (ref 5.0–8.0)

## 2024-04-19 LAB — SURGICAL PCR SCREEN
MRSA, PCR: NEGATIVE
Staphylococcus aureus: POSITIVE — AB

## 2024-04-19 NOTE — Patient Instructions (Addendum)
 Your procedure is scheduled on: [facility] 04/27/24 Report to the Registration Desk on the 1st floor of the [facility]. To find out your arrival time, please call [phone] between 1PM - 3PM on: TUESDAY 04/26/24 If your arrival time is 6:00 am, do not arrive before that time as the [facility] Mall entrance doors do not open until 6:00 am.  REMEMBER: Instructions that are not followed completely may result in serious medical risk, up to and including death; or upon the discretion of your surgeon and anesthesiologist your surgery may need to be rescheduled.  Do not eat food after midnight the night before surgery.  No gum chewing or hard candies.  You may however, drink CLEAR liquids up to 2 hours before you are scheduled to arrive for your surgery. Do not drink anything within 2 hours of your scheduled arrival time.  Clear liquids include: - water  - apple juice without pulp - gatorade (not RED colors) - black coffee or tea (Do NOT add milk or creamers to the coffee or tea) Do NOT drink anything that is not on this list.  Stop ANY OVER THE COUNTER supplements until after surgery.  STOP metFORMIN (GLUCOPHAGE) 2 days before surgery.    You may however, continue to take Tylenol  if needed for pain up until the day of surgery.  Continue taking all of your other prescription medications up until the day of surgery.  ON THE DAY OF SURGERY ONLY TAKE THESE MEDICATIONS WITH SIPS OF WATER:  diltiazem (TIAZAC)  esomeprazole (NEXIUM)  FLUoxetine  (PROZAC )  gabapentin  (NEURONTIN )  lamoTRIgine (LAMICTAL)  propranolol (INDERAL)   Use inhalers on the day of surgery and bring to the hospital.  No Alcohol for 24 hours before or after surgery.  No Smoking including e-cigarettes for 24 hours before surgery.  No chewable tobacco products for at least 6 hours before surgery.  No nicotine  patches on the day of surgery.  Do not use any recreational drugs for at least a week (preferably 2 weeks)  before your surgery.  Please be advised that the combination of cocaine and anesthesia may have negative outcomes, up to and including death. If you test positive for cocaine, your surgery will be cancelled.  On the morning of surgery brush your teeth with toothpaste and water, you may rinse your mouth with mouthwash if you wish. Do not swallow any toothpaste or mouthwash.  Use CHG Soap or wipes as directed on instruction sheet.  Do not wear jewelry, make-up, hairpins, clips or nail polish.  For welded (permanent) jewelry: bracelets, anklets, waist bands, etc.  Please have this removed prior to surgery.  If it is not removed, there is a chance that hospital personnel will need to cut it off on the day of surgery.  Do not wear lotions, powders, or perfumes.   Do not shave body hair from the neck down 48 hours before surgery.  Contact lenses, hearing aids and dentures may not be worn into surgery.  Do not bring valuables to the hospital. [facility] is not responsible for any missing/lost belongings or valuables.   Bring your C-PAP to the hospital in case you may have to spend the night.   Notify your doctor if there is any change in your medical condition (cold, fever, infection).  Wear comfortable clothing (specific to your surgery type) to the hospital.  After surgery, you can help prevent lung complications by doing breathing exercises.  Take deep breaths and cough every 1-2 hours. Your doctor may  order a device called an Incentive Spirometer to help you take deep breaths. When coughing or sneezing, hold a pillow firmly against your incision with both hands. This is called "splinting." Doing this helps protect your incision. It also decreases belly discomfort.  If you are being admitted to the hospital overnight, leave your suitcase in the car. After surgery it may be brought to your room.  In case of increased patient census, it may be necessary for you, the patient, to continue  your postoperative care in the Same Day Surgery department.  If you are being discharged the day of surgery, you will not be allowed to drive home. You will need a responsible individual to drive you home and stay with you for 24 hours after surgery.   If you are taking public transportation, you will need to have a responsible individual with you.  Please call the Pre-admissions Testing Dept. at [phone] if you have any questions about these instructions.  Surgery Visitation Policy:  Patients having surgery or a procedure may have two visitors.  Children under the age of 23 must have an adult with them who is not the patient.  Inpatient Visitation:    Visiting hours are 7 a.m. to 8 p.m. Up to four visitors are allowed at one time in a patient room. The visitors may rotate out with other people during the day.  One visitor age 37 or older may stay with the patient overnight and must be in the room by 8 p.m.   Merchandiser, Retail to address health-related social needs:  [email or web address]    Pre-operative 4 CHG Bath Instructions   You can play a key role in reducing the risk of infection after surgery. Your skin needs to be as free of germs as possible. You can reduce the number of germs on your skin by washing with CHG (chlorhexidine  gluconate) soap before surgery. CHG is an antiseptic soap that kills germs and continues to kill germs even after washing.   DO NOT use if you have an allergy to chlorhexidine /CHG or antibacterial soaps. If your skin becomes reddened or irritated, stop using the CHG and notify one of our RNs at [phone].   Please shower with the CHG soap starting 4 days before surgery using the following schedule:     Please keep in mind the following:  DO NOT shave, including legs and underarms, starting the day of your first shower.   You may shave your face at any point before/day of surgery.  Place clean sheets on your bed the  day you start using CHG soap. Use a clean washcloth (not used since being washed) for each shower. DO NOT sleep with pets once you start using the CHG.   CHG Shower Instructions:  If you choose to wash your hair and private area, wash first with your normal shampoo/soap.  After you use shampoo/soap, rinse your hair and body thoroughly to remove shampoo/soap residue.  Turn the water OFF and apply about 3 tablespoons (45 ml) of CHG soap to a CLEAN washcloth.  Apply CHG soap ONLY FROM YOUR NECK DOWN TO YOUR TOES (washing for 3-5 minutes)  DO NOT use CHG soap on face, private areas, open wounds, or sores.  Pay special attention to the area where your surgery is being performed.  If you are having back surgery, having someone wash your back for you may be helpful. Wait 2 minutes after CHG soap is applied, then you may rinse off the CHG  soap.  Pat dry with a clean towel  Put on clean clothes/pajamas   If you choose to wear lotion, please use ONLY the CHG-compatible lotions on the back of this paper.     Additional instructions for the day of surgery: DO NOT APPLY any lotions, deodorants, cologne, or perfumes.   Put on clean/comfortable clothes.  Brush your teeth.  Ask your nurse before applying any prescription medications to the skin.      CHG Compatible Lotions   Aveeno Moisturizing lotion  Cetaphil Moisturizing Cream  Cetaphil Moisturizing Lotion  Clairol Herbal Essence Moisturizing Lotion, Dry Skin  Clairol Herbal Essence Moisturizing Lotion, Extra Dry Skin  Clairol Herbal Essence Moisturizing Lotion, Normal Skin  Curel Age Defying Therapeutic Moisturizing Lotion with Alpha Hydroxy  Curel Extreme Care Body Lotion  Curel Soothing Hands Moisturizing Hand Lotion  Curel Therapeutic Moisturizing Cream, Fragrance-Free  Curel Therapeutic Moisturizing Lotion, Fragrance-Free  Curel Therapeutic Moisturizing Lotion, Original Formula  Eucerin Daily Replenishing Lotion  Eucerin Dry Skin  Therapy Plus Alpha Hydroxy Crme  Eucerin Dry Skin Therapy Plus Alpha Hydroxy Lotion  Eucerin Original Crme  Eucerin Original Lotion  Eucerin Plus Crme Eucerin Plus Lotion  Eucerin TriLipid Replenishing Lotion  Keri Anti-Bacterial Hand Lotion  Keri Deep Conditioning Original Lotion Dry Skin Formula Softly Scented  Keri Deep Conditioning Original Lotion, Fragrance Free Sensitive Skin Formula  Keri Lotion Fast Absorbing Fragrance Free Sensitive Skin Formula  Keri Lotion Fast Absorbing Softly Scented Dry Skin Formula  Keri Original Lotion  Keri Skin Renewal Lotion Keri Silky Smooth Lotion  Keri Silky Smooth Sensitive Skin Lotion  Nivea Body Creamy Conditioning Oil  Nivea Body Extra Enriched Teacher, Adult Education Moisturizing Lotion Nivea Crme  Nivea Skin Firming Lotion  NutraDerm 30 Skin Lotion  NutraDerm Skin Lotion  NutraDerm Therapeutic Skin Cream  NutraDerm Therapeutic Skin Lotion  ProShield Protective Hand Cream  Provon moisturizing lotion

## 2024-04-26 MED ORDER — CHLORHEXIDINE GLUCONATE 0.12 % MT SOLN
15.0000 mL | Freq: Once | OROMUCOSAL | Status: AC
  Administered 2024-04-27: 15 mL via OROMUCOSAL

## 2024-04-26 MED ORDER — SODIUM CHLORIDE 0.9 % IV SOLN: INTRAVENOUS | Status: DC

## 2024-04-26 MED ORDER — VANCOMYCIN HCL IN DEXTROSE 1-5 GM/200ML-% IV SOLN
1000.0000 mg | Freq: Once | INTRAVENOUS | Status: AC
  Administered 2024-04-27: 1000 mg via INTRAVENOUS

## 2024-04-26 MED ORDER — ORAL CARE MOUTH RINSE: 15.0000 mL | Freq: Once | OROMUCOSAL | Status: AC

## 2024-04-27 ENCOUNTER — Ambulatory Visit

## 2024-04-27 ENCOUNTER — Other Ambulatory Visit: Payer: Self-pay

## 2024-04-27 ENCOUNTER — Encounter: Payer: Self-pay | Admitting: Oncology

## 2024-04-27 ENCOUNTER — Ambulatory Visit: Payer: Self-pay | Admitting: Urgent Care

## 2024-04-27 ENCOUNTER — Encounter: Admission: RE | Disposition: A | Payer: Self-pay | Source: Home / Self Care | Attending: Neurosurgery

## 2024-04-27 ENCOUNTER — Observation Stay
Admission: RE | Admit: 2024-04-27 | Discharge: 2024-05-02 | Disposition: A | Discharge status: Home / Self Care | Attending: Neurosurgery | Admitting: Neurosurgery

## 2024-04-27 ENCOUNTER — Encounter: Payer: Self-pay | Admitting: Neurosurgery

## 2024-04-27 DIAGNOSIS — S32009K Unspecified fracture of unspecified lumbar vertebra, subsequent encounter for fracture with nonunion: Secondary | ICD-10-CM

## 2024-04-27 DIAGNOSIS — M438X9 Other specified deforming dorsopathies, site unspecified: Secondary | ICD-10-CM

## 2024-04-27 DIAGNOSIS — M960 Pseudarthrosis after fusion or arthrodesis: Secondary | ICD-10-CM | POA: Diagnosis not present

## 2024-04-27 DIAGNOSIS — M51362 Other intervertebral disc degeneration, lumbar region with discogenic back pain and lower extremity pain: Secondary | ICD-10-CM

## 2024-04-27 DIAGNOSIS — M5416 Radiculopathy, lumbar region: Secondary | ICD-10-CM

## 2024-04-27 DIAGNOSIS — E119 Type 2 diabetes mellitus without complications: Secondary | ICD-10-CM

## 2024-04-27 DIAGNOSIS — Z981 Arthrodesis status: Principal | ICD-10-CM

## 2024-04-27 DIAGNOSIS — Z01818 Encounter for other preprocedural examination: Secondary | ICD-10-CM

## 2024-04-27 HISTORY — PX: APPLICATION OF INTRAOPERATIVE CT SCAN: SHX6668

## 2024-04-27 HISTORY — PX: TRANSFORAMINAL LUMBAR INTERBODY FUSION (TLIF) WITH PEDICLE SCREW FIXATION 3 LEVEL: SHX6143

## 2024-04-27 LAB — GLUCOSE, CAPILLARY
Glucose-Capillary: 110 mg/dL — ABNORMAL HIGH (ref 70–99)
Glucose-Capillary: 161 mg/dL — ABNORMAL HIGH (ref 70–99)

## 2024-04-27 SURGERY — TRANSFORAMINAL LUMBAR INTERBODY FUSION (TLIF) WITH PEDICLE SCREW FIXATION 3 LEVEL
Anesthesia: General

## 2024-04-27 MED ORDER — SUCCINYLCHOLINE CHLORIDE 200 MG/10ML IV SOSY
PREFILLED_SYRINGE | INTRAVENOUS | Status: DC | PRN
  Administered 2024-04-27: 120 mg via INTRAVENOUS

## 2024-04-27 MED ORDER — CEFAZOLIN IN SODIUM CHLORIDE 2-0.9 GM/100ML-% IV SOLN
2.0000 g | Freq: Once | INTRAVENOUS | Status: AC
  Administered 2024-04-27: 2 g via INTRAVENOUS
  Filled 2024-04-27: qty 100

## 2024-04-27 MED ORDER — HYDROMORPHONE HCL 1 MG/ML IJ SOLN
INTRAMUSCULAR | Status: DC | PRN
  Administered 2024-04-27 (×4): .5 mg via INTRAVENOUS

## 2024-04-27 MED ORDER — DEXMEDETOMIDINE HCL IN NACL 200 MCG/50ML IV SOLN
INTRAVENOUS | Status: DC | PRN
  Administered 2024-04-27: 4 ug via INTRAVENOUS
  Administered 2024-04-27: 8 ug via INTRAVENOUS

## 2024-04-27 MED ORDER — OXYCODONE HCL 5 MG PO TABS: 5.0000 mg | ORAL_TABLET | Freq: Four times a day (QID) | ORAL | Status: DC | PRN

## 2024-04-27 MED ORDER — OXYCODONE HCL 5 MG PO TABS
5.0000 mg | ORAL_TABLET | ORAL | Status: DC | PRN
  Administered 2024-04-27: 5 mg via ORAL
  Filled 2024-04-27: qty 1

## 2024-04-27 MED ORDER — FLUOXETINE HCL 20 MG PO CAPS
60.0000 mg | ORAL_CAPSULE | Freq: Every morning | ORAL | Status: DC
  Administered 2024-04-28 – 2024-05-02 (×5): 60 mg via ORAL
  Filled 2024-04-27 (×5): qty 3

## 2024-04-27 MED ORDER — LACTATED RINGERS IV SOLN: INTRAVENOUS | Status: DC | PRN

## 2024-04-27 MED ORDER — LAMOTRIGINE 25 MG PO TABS
200.0000 mg | ORAL_TABLET | Freq: Every morning | ORAL | Status: DC
  Administered 2024-04-28 – 2024-05-02 (×5): 200 mg via ORAL
  Filled 2024-04-27 (×5): qty 8

## 2024-04-27 MED ORDER — DILTIAZEM HCL ER 240 MG PO TB24
240.0000 mg | ORAL_TABLET | Freq: Every morning | ORAL | Status: DC
  Administered 2024-04-28 – 2024-05-02 (×5): 240 mg via ORAL
  Filled 2024-04-27 (×10): qty 1

## 2024-04-27 MED ORDER — ONDANSETRON HCL 4 MG/2ML IJ SOLN
INTRAMUSCULAR | Status: AC
  Filled 2024-04-27: qty 2

## 2024-04-27 MED ORDER — LAMOTRIGINE 25 MG PO TABS
50.0000 mg | ORAL_TABLET | Freq: Every day | ORAL | Status: DC
  Administered 2024-04-27 – 2024-05-01 (×5): 50 mg via ORAL
  Filled 2024-04-27 (×5): qty 2

## 2024-04-27 MED ORDER — BUPIVACAINE-EPINEPHRINE (PF) 0.5% -1:200000 IJ SOLN
INTRAMUSCULAR | Status: DC | PRN
  Administered 2024-04-27: 10 mL

## 2024-04-27 MED ORDER — MENTHOL 3 MG MT LOZG: 1.0000 | LOZENGE | OROMUCOSAL | Status: DC | PRN

## 2024-04-27 MED ORDER — HYDROMORPHONE HCL 1 MG/ML IJ SOLN
INTRAMUSCULAR | Status: AC
  Filled 2024-04-27: qty 1

## 2024-04-27 MED ORDER — PROPOFOL 1000 MG/100ML IV EMUL
INTRAVENOUS | Status: AC
  Filled 2024-04-27: qty 100

## 2024-04-27 MED ORDER — SODIUM CHLORIDE 0.9% FLUSH: 3.0000 mL | INTRAVENOUS | Status: DC | PRN

## 2024-04-27 MED ORDER — IRRISEPT - 450ML BOTTLE WITH 0.05% CHG IN STERILE WATER, USP 99.95% OPTIME
TOPICAL | Status: DC | PRN
  Administered 2024-04-27: 450 mL via TOPICAL

## 2024-04-27 MED ORDER — MUPIROCIN 2 % EX OINT
1.0000 | TOPICAL_OINTMENT | Freq: Two times a day (BID) | CUTANEOUS | 0 refills | Status: DC
  Filled 2024-04-27: qty 22, 11d supply, fill #0

## 2024-04-27 MED ORDER — VANCOMYCIN HCL 1000 MG IV SOLR
INTRAVENOUS | Status: AC
  Filled 2024-04-27: qty 20

## 2024-04-27 MED ORDER — OXYCODONE HCL 5 MG PO TABS
10.0000 mg | ORAL_TABLET | ORAL | Status: DC | PRN
  Administered 2024-04-27: 10 mg via ORAL

## 2024-04-27 MED ORDER — LOSARTAN POTASSIUM 50 MG PO TABS: 50.0000 mg | ORAL_TABLET | Freq: Every day | ORAL | Status: DC | PRN

## 2024-04-27 MED ORDER — SODIUM CHLORIDE 0.9 % IV SOLN: 250.0000 mL | INTRAVENOUS | Status: AC

## 2024-04-27 MED ORDER — GABAPENTIN 300 MG PO CAPS
300.0000 mg | ORAL_CAPSULE | Freq: Three times a day (TID) | ORAL | Status: DC
  Administered 2024-04-28 – 2024-05-02 (×13): 300 mg via ORAL
  Filled 2024-04-27 (×14): qty 1

## 2024-04-27 MED ORDER — OXYCODONE-ACETAMINOPHEN 5-325 MG PO TABS: 1.0000 | ORAL_TABLET | Freq: Four times a day (QID) | ORAL | Status: DC | PRN

## 2024-04-27 MED ORDER — SODIUM CHLORIDE 0.9 % IV SOLN: INTRAVENOUS | Status: AC

## 2024-04-27 MED ORDER — DEXAMETHASONE SOD PHOSPHATE PF 10 MG/ML IJ SOLN
INTRAMUSCULAR | Status: DC | PRN
  Administered 2024-04-27: 10 mg via INTRAVENOUS

## 2024-04-27 MED ORDER — REMIFENTANIL HCL 1 MG IV SOLR
INTRAVENOUS | Status: AC
  Filled 2024-04-27: qty 1000

## 2024-04-27 MED ORDER — ACETAMINOPHEN 325 MG PO TABS
650.0000 mg | ORAL_TABLET | ORAL | Status: DC | PRN
  Administered 2024-04-28 – 2024-04-30 (×3): 650 mg via ORAL
  Filled 2024-04-27 (×3): qty 2

## 2024-04-27 MED ORDER — MIDAZOLAM HCL 2 MG/2ML IJ SOLN
INTRAMUSCULAR | Status: AC
  Filled 2024-04-27: qty 2

## 2024-04-27 MED ORDER — PROPRANOLOL HCL 20 MG PO TABS
30.0000 mg | ORAL_TABLET | Freq: Two times a day (BID) | ORAL | Status: DC
  Administered 2024-04-28 – 2024-05-02 (×9): 30 mg via ORAL
  Filled 2024-04-27 (×10): qty 2

## 2024-04-27 MED ORDER — LIDOCAINE HCL (PF) 2 % IJ SOLN
INTRAMUSCULAR | Status: AC
  Filled 2024-04-27: qty 5

## 2024-04-27 MED ORDER — ACETAMINOPHEN 650 MG RE SUPP: 650.0000 mg | RECTAL | Status: DC | PRN

## 2024-04-27 MED ORDER — MIDAZOLAM HCL (PF) 2 MG/2ML IJ SOLN
INTRAMUSCULAR | Status: DC | PRN
  Administered 2024-04-27: 2 mg via INTRAVENOUS

## 2024-04-27 MED ORDER — HYDROMORPHONE HCL 1 MG/ML IJ SOLN
2.0000 mg | INTRAMUSCULAR | Status: DC | PRN
  Administered 2024-04-27 – 2024-04-28 (×3): 2 mg via INTRAVENOUS
  Filled 2024-04-27 (×3): qty 2

## 2024-04-27 MED ORDER — OXYCODONE HCL 5 MG PO TABS
15.0000 mg | ORAL_TABLET | Freq: Four times a day (QID) | ORAL | Status: DC
  Administered 2024-04-28 – 2024-05-02 (×17): 15 mg via ORAL
  Filled 2024-04-27 (×17): qty 3

## 2024-04-27 MED ORDER — 0.9 % SODIUM CHLORIDE (POUR BTL) OPTIME
TOPICAL | Status: DC | PRN
  Administered 2024-04-27: 500 mL

## 2024-04-27 MED ORDER — OXYCODONE-ACETAMINOPHEN 10-325 MG PO TABS: 1.0000 | ORAL_TABLET | Freq: Four times a day (QID) | ORAL | Status: DC

## 2024-04-27 MED ORDER — PHENYLEPHRINE HCL-NACL 20-0.9 MG/250ML-% IV SOLN
INTRAVENOUS | Status: DC | PRN
  Administered 2024-04-27: 20 ug/min via INTRAVENOUS

## 2024-04-27 MED ORDER — PANTOPRAZOLE SODIUM 40 MG PO TBEC
40.0000 mg | DELAYED_RELEASE_TABLET | Freq: Two times a day (BID) | ORAL | Status: DC
  Administered 2024-04-27 – 2024-05-02 (×10): 40 mg via ORAL
  Filled 2024-04-27 (×10): qty 1

## 2024-04-27 MED ORDER — SUCCINYLCHOLINE CHLORIDE 200 MG/10ML IV SOSY
PREFILLED_SYRINGE | INTRAVENOUS | Status: AC
  Filled 2024-04-27: qty 10

## 2024-04-27 MED ORDER — PROPOFOL 10 MG/ML IV BOLUS
INTRAVENOUS | Status: DC | PRN
  Administered 2024-04-27: 120 ug/kg/min via INTRAVENOUS
  Administered 2024-04-27: 30 mg via INTRAVENOUS
  Administered 2024-04-27: 170 mg via INTRAVENOUS

## 2024-04-27 MED ORDER — VITAMIN B-12 1000 MCG PO TABS
1000.0000 ug | ORAL_TABLET | Freq: Every day | ORAL | Status: DC
  Administered 2024-04-28 – 2024-05-02 (×5): 1000 ug via ORAL
  Filled 2024-04-27 (×5): qty 1

## 2024-04-27 MED ORDER — CHLORHEXIDINE GLUCONATE 0.12 % MT SOLN
OROMUCOSAL | Status: AC
  Filled 2024-04-27: qty 15

## 2024-04-27 MED ORDER — PHENYLEPHRINE HCL (PRESSORS) 10 MG/ML IV SOLN
INTRAVENOUS | Status: AC
  Filled 2024-04-27: qty 1

## 2024-04-27 MED ORDER — METFORMIN HCL 500 MG PO TABS
500.0000 mg | ORAL_TABLET | Freq: Every day | ORAL | Status: DC
  Administered 2024-04-28 – 2024-05-01 (×4): 500 mg via ORAL
  Filled 2024-04-27 (×4): qty 1

## 2024-04-27 MED ORDER — PROPOFOL 10 MG/ML IV BOLUS
INTRAVENOUS | Status: AC
  Filled 2024-04-27: qty 20

## 2024-04-27 MED ORDER — TRAZODONE HCL 100 MG PO TABS
200.0000 mg | ORAL_TABLET | Freq: Every day | ORAL | Status: DC
  Administered 2024-04-27 – 2024-05-01 (×5): 200 mg via ORAL
  Filled 2024-04-27 (×5): qty 2

## 2024-04-27 MED ORDER — REMIFENTANIL HCL 1 MG IV SOLR
INTRAVENOUS | Status: DC | PRN
  Administered 2024-04-27: .12 ug/kg/min via INTRAVENOUS

## 2024-04-27 MED ORDER — ONDANSETRON HCL 4 MG PO TABS
4.0000 mg | ORAL_TABLET | Freq: Four times a day (QID) | ORAL | Status: DC | PRN
  Administered 2024-04-28 – 2024-04-29 (×2): 4 mg via ORAL
  Filled 2024-04-27 (×2): qty 1

## 2024-04-27 MED ORDER — ONDANSETRON HCL 4 MG/2ML IJ SOLN
INTRAMUSCULAR | Status: DC | PRN
  Administered 2024-04-27: 4 mg via INTRAVENOUS

## 2024-04-27 MED ORDER — ATORVASTATIN CALCIUM 20 MG PO TABS
40.0000 mg | ORAL_TABLET | Freq: Every day | ORAL | Status: DC
  Administered 2024-04-27 – 2024-05-01 (×5): 40 mg via ORAL
  Filled 2024-04-27 (×5): qty 2

## 2024-04-27 MED ORDER — SURGIFLO WITH THROMBIN (HEMOSTATIC MATRIX KIT) OPTIME
TOPICAL | Status: DC | PRN
  Administered 2024-04-27 (×2): 1 via TOPICAL

## 2024-04-27 MED ORDER — DOCUSATE SODIUM 100 MG PO CAPS
100.0000 mg | ORAL_CAPSULE | Freq: Two times a day (BID) | ORAL | Status: DC
  Administered 2024-04-28 – 2024-05-01 (×7): 100 mg via ORAL
  Filled 2024-04-27 (×9): qty 1

## 2024-04-27 MED ORDER — DIAZEPAM 5 MG PO TABS
5.0000 mg | ORAL_TABLET | Freq: Three times a day (TID) | ORAL | Status: DC | PRN
  Administered 2024-04-27 – 2024-04-29 (×2): 5 mg via ORAL
  Filled 2024-04-27 (×2): qty 1

## 2024-04-27 MED ORDER — SORBITOL 70 % SOLN: 30.0000 mL | Freq: Every day | Status: DC | PRN

## 2024-04-27 MED ORDER — ROCURONIUM BROMIDE 10 MG/ML (PF) SYRINGE
PREFILLED_SYRINGE | INTRAVENOUS | Status: AC
  Filled 2024-04-27: qty 10

## 2024-04-27 MED ORDER — ACETAMINOPHEN 10 MG/ML IV SOLN
INTRAVENOUS | Status: AC
  Filled 2024-04-27: qty 100

## 2024-04-27 MED ORDER — FENTANYL CITRATE (PF) 100 MCG/2ML IJ SOLN
INTRAMUSCULAR | Status: DC | PRN
  Administered 2024-04-27 (×2): 50 ug via INTRAVENOUS

## 2024-04-27 MED ORDER — BUPIVACAINE HCL (PF) 0.5 % IJ SOLN
INTRAMUSCULAR | Status: AC
  Filled 2024-04-27: qty 30

## 2024-04-27 MED ORDER — FENTANYL CITRATE (PF) 100 MCG/2ML IJ SOLN
INTRAMUSCULAR | Status: AC
  Filled 2024-04-27: qty 2

## 2024-04-27 MED ORDER — FENTANYL CITRATE (PF) 100 MCG/2ML IJ SOLN
25.0000 ug | INTRAMUSCULAR | Status: DC | PRN
  Administered 2024-04-27 (×2): 50 ug via INTRAVENOUS

## 2024-04-27 MED ORDER — VANCOMYCIN HCL 1000 MG IV SOLR
INTRAVENOUS | Status: DC | PRN
  Administered 2024-04-27: 1000 mg via TOPICAL

## 2024-04-27 MED ORDER — MESALAMINE 1.2 G PO TBEC
4.8000 g | DELAYED_RELEASE_TABLET | Freq: Every morning | ORAL | Status: DC
  Administered 2024-04-28 – 2024-05-02 (×5): 4.8 g via ORAL
  Filled 2024-04-27 (×5): qty 4

## 2024-04-27 MED ORDER — LORATADINE 10 MG PO TABS
10.0000 mg | ORAL_TABLET | Freq: Every day | ORAL | Status: DC
  Administered 2024-04-28 – 2024-05-02 (×5): 10 mg via ORAL
  Filled 2024-04-27 (×5): qty 1

## 2024-04-27 MED ORDER — LIDOCAINE HCL (CARDIAC) PF 100 MG/5ML IV SOSY
PREFILLED_SYRINGE | INTRAVENOUS | Status: DC | PRN
  Administered 2024-04-27: 100 mg via INTRAVENOUS

## 2024-04-27 MED ORDER — VITAMIN B-12 1000 MCG PO TABS: 1000.0000 ug | ORAL_TABLET | Freq: Every morning | ORAL | Status: DC

## 2024-04-27 MED ORDER — ACETAMINOPHEN 10 MG/ML IV SOLN
INTRAVENOUS | Status: DC | PRN
  Administered 2024-04-27: 1000 mg via INTRAVENOUS

## 2024-04-27 MED ORDER — SODIUM CHLORIDE (PF) 0.9 % IJ SOLN
INTRAMUSCULAR | Status: DC | PRN
  Administered 2024-04-27: 60 mL via INTRAMUSCULAR

## 2024-04-27 MED ORDER — METFORMIN HCL 500 MG PO TABS
1000.0000 mg | ORAL_TABLET | Freq: Every day | ORAL | Status: DC
  Administered 2024-04-28 – 2024-05-02 (×5): 1000 mg via ORAL
  Filled 2024-04-27 (×5): qty 2

## 2024-04-27 MED ORDER — SENNA 8.6 MG PO TABS
1.0000 | ORAL_TABLET | Freq: Two times a day (BID) | ORAL | Status: DC
  Administered 2024-04-28 – 2024-05-01 (×7): 8.6 mg via ORAL
  Filled 2024-04-27 (×9): qty 1

## 2024-04-27 MED ORDER — BROMOCRIPTINE MESYLATE 2.5 MG PO TABS
2.5000 mg | ORAL_TABLET | Freq: Every day | ORAL | Status: DC
  Administered 2024-04-27 – 2024-05-01 (×5): 2.5 mg via ORAL
  Filled 2024-04-27 (×5): qty 1

## 2024-04-27 MED ORDER — ENOXAPARIN SODIUM 60 MG/0.6ML IJ SOSY
0.5000 mg/kg | PREFILLED_SYRINGE | INTRAMUSCULAR | Status: DC
  Administered 2024-04-28 – 2024-05-02 (×5): 50 mg via SUBCUTANEOUS
  Filled 2024-04-27 (×5): qty 0.6

## 2024-04-27 MED ORDER — CHLORHEXIDINE GLUCONATE 4 % EX SOLN
1.0000 | CUTANEOUS | 1 refills | Status: DC
  Filled 2024-04-27: qty 118, 30d supply, fill #0

## 2024-04-27 MED ORDER — ONDANSETRON HCL 4 MG/2ML IJ SOLN: 4.0000 mg | Freq: Four times a day (QID) | INTRAMUSCULAR | Status: DC | PRN

## 2024-04-27 MED ORDER — HYDROMORPHONE HCL 1 MG/ML IJ SOLN: 0.5000 mg | INTRAMUSCULAR | Status: DC | PRN

## 2024-04-27 MED ORDER — PHENOL 1.4 % MT LIQD: 1.0000 | OROMUCOSAL | Status: DC | PRN

## 2024-04-27 MED ORDER — BUPIVACAINE LIPOSOME 1.3 % IJ SUSP
INTRAMUSCULAR | Status: AC
  Filled 2024-04-27: qty 20

## 2024-04-27 MED ORDER — MAGNESIUM CITRATE PO SOLN: 1.0000 | Freq: Once | ORAL | Status: DC | PRN

## 2024-04-27 MED ORDER — PROPRANOLOL HCL 20 MG PO TABS: 20.0000 mg | ORAL_TABLET | Freq: Two times a day (BID) | ORAL | Status: DC

## 2024-04-27 MED ORDER — SODIUM CHLORIDE (PF) 0.9 % IJ SOLN
INTRAMUSCULAR | Status: AC
  Filled 2024-04-27: qty 60

## 2024-04-27 MED ORDER — SODIUM CHLORIDE 0.9% FLUSH
3.0000 mL | Freq: Two times a day (BID) | INTRAVENOUS | Status: DC
  Administered 2024-04-28 – 2024-05-02 (×9): 3 mL via INTRAVENOUS

## 2024-04-27 MED ORDER — HYDROMORPHONE HCL 1 MG/ML IJ SOLN
1.0000 mg | INTRAMUSCULAR | Status: DC | PRN
  Administered 2024-04-27 (×2): 1 mg via INTRAVENOUS

## 2024-04-27 MED ORDER — KETAMINE HCL 50 MG/5ML IJ SOSY
PREFILLED_SYRINGE | INTRAMUSCULAR | Status: DC | PRN
  Administered 2024-04-27 (×2): 10 mg via INTRAVENOUS
  Administered 2024-04-27: 30 mg via INTRAVENOUS

## 2024-04-27 MED ORDER — VANCOMYCIN HCL IN DEXTROSE 1-5 GM/200ML-% IV SOLN
INTRAVENOUS | Status: AC
  Filled 2024-04-27: qty 200

## 2024-04-27 MED ORDER — DROPERIDOL 2.5 MG/ML IJ SOLN: 0.6250 mg | Freq: Once | INTRAMUSCULAR | Status: DC | PRN

## 2024-04-27 MED ORDER — OXYCODONE HCL 5 MG PO TABS
ORAL_TABLET | ORAL | Status: AC
  Filled 2024-04-27: qty 2

## 2024-04-27 MED ORDER — POLYETHYLENE GLYCOL 3350 17 G PO PACK: 17.0000 g | PACK | Freq: Every day | ORAL | Status: DC | PRN

## 2024-04-27 MED ORDER — KETAMINE HCL 50 MG/5ML IJ SOSY
PREFILLED_SYRINGE | INTRAMUSCULAR | Status: AC
  Filled 2024-04-27: qty 5

## 2024-04-27 MED ORDER — CEFAZOLIN SODIUM-DEXTROSE 2-4 GM/100ML-% IV SOLN
INTRAVENOUS | Status: AC
  Filled 2024-04-27: qty 100

## 2024-04-27 MED ORDER — CYCLOBENZAPRINE HCL 10 MG PO TABS
10.0000 mg | ORAL_TABLET | Freq: Three times a day (TID) | ORAL | Status: DC
  Administered 2024-04-27 – 2024-05-02 (×14): 10 mg via ORAL
  Filled 2024-04-27 (×14): qty 1

## 2024-04-27 SURGICAL SUPPLY — 57 items
ALLOGRAFT BONE FIBER KORE 10CC (Bone Implant) IMPLANT
ALLOGRAFT BONE FIBER KORE 5 (Bone Implant) IMPLANT
ALLOGRAFT BONESTRIP KORE 2.5X5 (Bone Implant) IMPLANT
BASIN KIT SINGLE STR (MISCELLANEOUS) ×1 IMPLANT
BUR NEURO DRILL SOFT 3.0X3.8M (BURR) ×1 IMPLANT
CONNECTOR RELINE O CO 20 F/5/6 (Connector) IMPLANT
DERMABOND ADVANCED .7 DNX12 (GAUZE/BANDAGES/DRESSINGS) ×1 IMPLANT
DRAPE C-ARMOR (DRAPES) IMPLANT
DRAPE LAPAROTOMY 100X77 ABD (DRAPES) ×1 IMPLANT
DRAPE SCAN PATIENT (DRAPES) ×1 IMPLANT
DRAPE SPINE LEICA/WILD 54X150 (DRAPES) IMPLANT
DRAPE TABLE BACK 80X90 (DRAPES) ×1 IMPLANT
DRSG OPSITE POSTOP 4X8 (GAUZE/BANDAGES/DRESSINGS) IMPLANT
DRSG TEGADERM 4X4.75 (GAUZE/BANDAGES/DRESSINGS) IMPLANT
ELECTRODE EZSTD 165MM 6.5IN (MISCELLANEOUS) IMPLANT
ELECTRODE REM PT RTRN 9FT ADLT (ELECTROSURGICAL) ×1 IMPLANT
EVACUATOR 1/8 PVC DRAIN (DRAIN) IMPLANT
EX-PIN ORTHOLOCK NAV 4X150 (PIN) IMPLANT
FEE CVG SUPP BRAINLAB NG SPNE (MISCELLANEOUS) ×1 IMPLANT
FEE INTRAOP CADWELL SUPPLY NCS (MISCELLANEOUS) IMPLANT
FEE INTRAOP MONITOR IMPULS NCS (MISCELLANEOUS) IMPLANT
GAUZE 4X4 16PLY ~~LOC~~+RFID DBL (SPONGE) IMPLANT
GAUZE SPONGE 2X2 STRL 8-PLY (GAUZE/BANDAGES/DRESSINGS) IMPLANT
GLOVE BIOGEL PI IND STRL 6.5 (GLOVE) ×2 IMPLANT
GLOVE SURG SYN 6.5 PF PI (GLOVE) ×2 IMPLANT
GLOVE SURG SYN 8.5 PF PI (GLOVE) ×4 IMPLANT
GOWN SRG LRG LVL 4 IMPRV REINF (GOWNS) ×3 IMPLANT
GOWN SRG XL LVL 3 NONREINFORCE (GOWNS) ×1 IMPLANT
HOLDER FOLEY CATH W/STRAP (MISCELLANEOUS) IMPLANT
INTERBODY SABLE 10X26 7-14 15D (Miscellaneous) IMPLANT
KIT INFUSE LRG II (Orthopedic Implant) IMPLANT
KIT PREVENA INCISION MGT 13 (CANNISTER) IMPLANT
KIT PREVENA INCISION MGT20CM45 (CANNISTER) IMPLANT
KIT SPINAL PRONEVIEW (KITS) ×1 IMPLANT
LAVAGE JET IRRISEPT WOUND (IRRIGATION / IRRIGATOR) ×1 IMPLANT
MANIFOLD NEPTUNE II (INSTRUMENTS) ×1 IMPLANT
MARKER SPHERE PSV REFLC 13MM (MARKER) ×7 IMPLANT
NDL SAFETY ECLIP 18X1.5 (MISCELLANEOUS) ×1 IMPLANT
NS IRRIG 500ML POUR BTL (IV SOLUTION) ×1 IMPLANT
PACK LAMINECTOMY ARMC (PACKS) ×1 IMPLANT
ROD RELINE-O 5.5X120MM LORD (Rod) IMPLANT
SCREW LOCK RELINE 5.5 TULIP (Screw) IMPLANT
SCREW LOCK RELINE CLOSED TULIP (Screw) IMPLANT
SCREW RELINE-O POLY 6.5X45 (Screw) IMPLANT
SCREW RELINE-O POLY 6.5X50MM (Screw) IMPLANT
SCREW RELINE-O POLY 7.5X45 (Screw) IMPLANT
SCREW RLINE PLY 2S 50X7.5XPA (Screw) IMPLANT
SOLN STERILE WATER BTL 1000 ML (IV SOLUTION) IMPLANT
STAPLER SKIN PROX 35W (STAPLE) IMPLANT
SURGIFLO W/THROMBIN 8M KIT (HEMOSTASIS) ×1 IMPLANT
SUT STRATA 3-0 15 PS-2 (SUTURE) ×1 IMPLANT
SUT VIC AB 0 CT1 27XCR 8 STRN (SUTURE) ×2 IMPLANT
SUT VIC AB 2-0 CT1 18 (SUTURE) ×2 IMPLANT
SUTURE EHLN 3-0 FS-10 30 BLK (SUTURE) IMPLANT
SYR 30ML LL (SYRINGE) ×2 IMPLANT
TRAP FLUID SMOKE EVACUATOR (MISCELLANEOUS) ×1 IMPLANT
TRAY FOLEY SLVR 16FR LF STAT (SET/KITS/TRAYS/PACK) IMPLANT

## 2024-04-27 NOTE — Op Note (Signed)
 Indications: Ms. 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 The patient tried and failed conservative management, prompting surgical intervention.  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   Preoperative Note:   Risks of surgery discussed include: infection, bleeding, stroke, coma, death, paralysis, CSF leak, nerve/spinal cord injury, numbness, tingling, weakness, complex regional pain syndrome, recurrent stenosis and/or disc herniation, vascular injury, development of instability, neck/back pain, need for further surgery, persistent symptoms, development of deformity, and the risks of anesthesia. The patient understood these risks and agreed to proceed.  Operative Note:  1. Transforaminal Lumbar Interbody Fusion L2/3 and L4/5 2. Posterolateral arthrodesis L2 to L5 3. Posterior segmental instrumentation L2 to L5 using Nuvasive Reline implants 4. Harvesting of autograft via the same incision 5. Placement of a biomechanical device (Globus Sable) at L2/3 and at L4/5 for anterior arthrodesis 6. Use of stereotaxis    The patient was brought to the Operating Room, intubated and turned into the prone position. All pressure points were checked and double checked. Flouroscopy was used to mark the incision. The patient was prepped and draped in the standard fashion. A full timeout was performed. Preoperative antibiotics were given. The incision was injected with local anesthetic.  The incision was opened with a scalpel, then the soft tissues divided with the Bovie. Self-retaining retractors were placed. The  paraspinus muscles were reflected laterally in subperiosteal fashion until the transverse processes were visible. The prior implants were dissected free and removed.  The stereotactic array was placed.  Stereotactic images were acquired and registered to the patient.  We then used stereotactically guided drill guides to cannulate the pedicles bilaterally from L2 to L5. New tract was made at R L4.   The pedicle screws were placed. Nuvasive Reline screws were used.    After placement of pedicle screws, a screw-to-screw distractor was placed to distract the L4/5 disc space. We then turned attention to performing the transforaminal decompression and interbody fusion. The right L4/5 facet was removed with osteotomes and the drill, and handed off for preparation as autograft. The traversing and exiting nerve roots on the right were identified and protected. The disc was opened using a scalpel. After incising the disc space, we took a combination of pituitary rongeurs, Kerrison rongeurs, disc scrapers, and curettes to remove a majority of the disc material.  We prepared the end plates for accepting the interbody fusion.  We removed the cartilaginous plate, preserved the cortical endplate if possible during this procedure.  We serially dilated up in order to increase the size of the disc space, while protecting the traversing and exiting nerve roots, until we had sized up to a trial.    The trial was removed, and the disc space packed with autograft and allograft. The Globus Sable TLIF biomechanical device was inserted, then backfilled with allograft, with care taken to protect the nerve roots and thecal sac. After placement of the device, the screw-screw distractor was removed.  The distractor was then placed at L2/3 and the same procedure described at L4/5 was performed.   Rods were measured to length, cut, and shaped. The rods were secured using locking caps to manufacturer's specifications. Final AP and  lateral radiographs  were taken to confirm placement of instrumentation and appropriate alignment. The wound was copiously irrigated, then the external surfaces of the remaining lamina, facet, and transverse processes from L2 to L5 were decorticated. A mixture of allograft and autograft was placed over the decorticated surfaces for arthrodesis. BMP was utilized.  A drain was placed subfascially.   After hemostasis, the wound was closed in layers with 0 and 2-0 vicryl. Staples and a prevena were used on the skin.  The patient was then flipped supine and extubated with incident. All counts were correct times 2 at the end of the case. No immediate complications were noted.  Edsel Goods PA assisted in the entire procedure. An assistant was required for this procedure due to the complexity.  The assistant provided assistance in tissue manipulation and suction, and was required for the successful and safe performance of the procedure. I performed the critical portions of the procedure.   Reeves Daisy MD

## 2024-04-27 NOTE — Anesthesia Preprocedure Evaluation (Signed)
 Anesthesia Evaluation  Patient identified by MRN, date of birth, ID band Patient awake    Reviewed: Allergy & Precautions, NPO status , Patient's Chart, lab work & pertinent test results  History of Anesthesia Complications Negative for: history of anesthetic complications  Airway Mallampati: II  TM Distance: >3 FB Neck ROM: Full    Dental  (+) Teeth Intact, Dental Advidsory Given   Pulmonary neg shortness of breath, sleep apnea , neg COPD, neg recent URI, Patient did not abstain from smoking., former smoker   Pulmonary exam normal breath sounds clear to auscultation       Cardiovascular Exercise Tolerance: Good METShypertension, (-) angina (-) CAD, (-) Past MI and (-) Cardiac Stents + dysrhythmias (s/p ablation, no longer trouble) Supra Ventricular Tachycardia (-) Valvular Problems/Murmurs Rhythm:Regular Rate:Normal - Systolic murmurs    Neuro/Psych neg Seizures PSYCHIATRIC DISORDERS Anxiety Depression    TIA Neuromuscular disease (fibromyalgia)    GI/Hepatic Neg liver ROS,GERD  Medicated and Poorly Controlled,,(+)     (-) substance abuse    Endo/Other  diabetes (borderline), Well Controlled, Type 2    Renal/GU Renal diseasenegative Renal ROS     Musculoskeletal  (+) Arthritis ,  Fibromyalgia -, narcotic dependent  Abdominal   Peds  Hematology   Anesthesia Other Findings Past Medical History: No date: Acute focal neurologic deficit with complete resolution No date: Anxiety No date: Arthritis No date: Carpal tunnel syndrome 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) No date: Nicotine  dependence No date: Normocytic anemia No date: On long term drug therapy No date: OSA on CPAP No date: Pituitary tumor No date: PSVT (paroxysmal supraventricular tachycardia)     Comment:  None since ablation No date: Pulsatile tinnitus     Comment:  sporadic No date: Rectal bleeding No date: SBO (small bowel obstruction) (HCC) 07/02/2021: Stroke (HCC)     Comment:  Some short term memory issues  Reproductive/Obstetrics                              Anesthesia Physical Anesthesia Plan  ASA: 3  Anesthesia Plan: General   Post-op Pain Management: Regional block, Tylenol  PO (pre-op) and Fentanyl  IV   Induction: Intravenous  PONV Risk Score and Plan: 2 and Ondansetron , Dexamethasone , Propofol  infusion, TIVA, Midazolam  and Treatment may vary due to age or medical condition  Airway Management Planned: Oral ETT  Additional Equipment: None  Intra-op Plan:   Post-operative Plan: Extubation in OR  Informed Consent: I have reviewed the patients History and Physical, chart, labs and discussed the procedure including the risks, benefits and alternatives for the proposed anesthesia with the patient or authorized representative who has indicated his/her understanding and acceptance.     Dental advisory given  Plan Discussed with: CRNA and Surgeon  Anesthesia Plan Comments: (Discussed risks of anesthesia with patient, including PONV, sore throat, lip/dental/eye damage. Rare risks discussed as well, such as cardiorespiratory and neurological sequelae, and allergic reactions. Discussed the role of CRNA in patient's perioperative care. Patient understands. Patient counseled  on benefits of smoking cessation, and increased perioperative risks associated with continued smoking.  Discussed  r/b/a of adductor canal and popliteal block, including:  - bleeding, infection, nerve damage - poor or non functioning block. - reactions and toxicity to local anesthetic Patient understands. )        Anesthesia Quick Evaluation

## 2024-04-27 NOTE — Interval H&P Note (Signed)
 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  The various methods of treatment have been discussed with the patient and family. After consideration of risks, benefits and other options for treatment, the patient has consented to  Procedure(s) with comments: TRANSFORAMINAL LUMBAR INTERBODY FUSION (TLIF) WITH PEDICLE SCREW FIXATION 3 LEVEL (N/A) - OPEN L2-3 & L4-5 TRANSFORAMINAL LUMBAR INTERBODY FUSION (TLIF), L2-5 POSTERIOR SPINAL FUSION APPLICATION OF INTRAOPERATIVE CT SCAN (N/A) as a surgical intervention.  The patient's history has been reviewed, patient examined, no change in status, stable for surgery.  I have reviewed the patient's chart and labs.  Questions were answered to the patient's satisfaction.    Heart sounds normal no MRG. Chest Clear to Auscultation Bilaterally.   Misael Mcgaha

## 2024-04-27 NOTE — Anesthesia Procedure Notes (Signed)
 Procedure Name: Intubation Date/Time: 04/27/2024 3:25 PM  Performed by: Lorriane Arabia, CRNAPre-anesthesia Checklist: Patient identified, Patient being monitored, Timeout performed, Emergency Drugs available and Suction available Patient Re-evaluated:Patient Re-evaluated prior to induction Oxygen Delivery Method: Circle system utilized Preoxygenation: Pre-oxygenation with 100% oxygen Induction Type: IV induction Ventilation: Mask ventilation without difficulty Laryngoscope Size: 3 and McGrath Grade View: Grade I Tube type: Oral Tube size: 7.0 mm Number of attempts: 2 Airway Equipment and Method: Stylet Placement Confirmation: ETT inserted through vocal cords under direct vision, positive ETCO2 and breath sounds checked- equal and bilateral Secured at: 19 cm Tube secured with: Tape Dental Injury: Teeth and Oropharynx as per pre-operative assessment  Difficulty Due To: Difficulty was unanticipated and Difficult Airway- due to anterior larynx Future Recommendations: Recommend- induction with short-acting agent, and alternative techniques readily available Comments: Use of bougie to place tube. Grade I view. Easy mask

## 2024-04-27 NOTE — Discharge Instructions (Signed)
 Your surgeon has performed an operation on your lumbar spine (low back) to relieve pressure on one or more nerves. Many times, patients feel better immediately after surgery and can "overdo it." Even if you feel well, it is important that you follow these activity guidelines. If you do not let your back heal properly from the surgery, you can increase the chance of hardware complications and/or return of your symptoms. The following are instructions to help in your recovery once you have been discharged from the hospital.  Do not use NSAIDs for 3 months after surgery.  *Regarding compression stockings-  Please wear day and night until you are walking a couple hundred feet three times a day.   Activity    No bending, lifting, or twisting ("BLT"). Avoid lifting objects heavier than 10 pounds (gallon milk jug).  Where possible, avoid household activities that involve lifting, bending, pushing, or pulling such as laundry, vacuuming, grocery shopping, and childcare. Try to arrange for help from friends and family for these activities while your back heals.  Increase physical activity slowly as tolerated.  Taking short walks is encouraged, but avoid strenuous exercise. Do not jog, run, bicycle, lift weights, or participate in any other exercises unless specifically allowed by your doctor. Avoid prolonged sitting, including car rides.  Talk to your doctor before resuming sexual activity.  You should not drive until cleared by your doctor.  Until released by your doctor, you should not return to work or school.  You should rest at home and let your body heal.   You may shower three days after your surgery.  After showering, lightly dab your incision dry. Do not take a tub bath or go swimming for 3 weeks, or until approved by your doctor at your follow-up appointment.  If you smoke, we strongly recommend that you quit.  Smoking has been proven to interfere with normal healing in your back and will  dramatically reduce the success rate of your surgery. Please contact [facility] (800-QUIT-NOW) and use the resources at www.[facility].com for assistance in stopping smoking.  Surgical Incision   If you have a dressing on your incision, you may remove it on 12/10. Keep your incision area clean and dry.  Your incision was closed with staples. These will be removed about 10-14 days from surgery at your post-op appointment  Diet            You may return to your usual diet. Be sure to stay hydrated.  When to Contact Us   Although your surgery and recovery will likely be uneventful, you may have some residual numbness, aches, and pains in your back and/or legs. This is normal and should improve in the next few weeks.  However, should you experience any of the following, contact us  immediately: New numbness or weakness Pain that is progressively getting worse, and is not relieved by your pain medications or rest Bleeding, redness, swelling, pain, or drainage from surgical incision Chills or flu-like symptoms Fever greater than 101.0 F (38.3 C) Problems with bowel or bladder functions Difficulty breathing or shortness of breath Warmth, tenderness, or swelling in your calf  Contact Information How to contact us :  If you have any questions/concerns before or after surgery, you can reach us  at [phone], or you can send a mychart message. We can be reached by phone or mychart 8am-4pm, Monday-Friday.  *Please note: Calls after 4pm are forwarded to a third party answering service. Mychart messages are not routinely monitored during evenings, weekends, and  holidays. Please call our office to contact the answering service for urgent concerns during non-business hours.

## 2024-04-27 NOTE — Progress Notes (Signed)
 Patient awake/alert x4. Patient moving in bed side to side with assistance for positioning for comfort. Prevena in place. Medicated for pain as ordered. Tolerating po fluids without event.

## 2024-04-27 NOTE — Transfer of Care (Signed)
 Immediate Anesthesia Transfer of Care Note  Patient: Breanna Ramos  Procedure(s) Performed: OPEN L2-3 & L4-5 TRANSFORAMINAL LUMBAR INTERBODY FUSION (TLIF), L2-5 POSTERIOR SPINAL FUSION APPLICATION OF INTRAOPERATIVE CT SCAN  Patient Location: PACU  Anesthesia Type:General  Level of Consciousness: drowsy  Airway & Oxygen Therapy: Patient Spontanous Breathing and Patient connected to face mask oxygen  Post-op Assessment: Report given to RN and Post -op Vital signs reviewed and stable  Post vital signs: Reviewed and stable  Last Vitals:  Vitals Value Taken Time  BP    Temp    Pulse    Resp    SpO2      Last Pain:  Vitals:   04/27/24 1434  TempSrc: Temporal  PainSc: 6          Complications: No notable events documented.

## 2024-04-28 ENCOUNTER — Encounter: Payer: Self-pay | Admitting: Neurosurgery

## 2024-04-28 ENCOUNTER — Other Ambulatory Visit: Payer: Self-pay

## 2024-04-28 LAB — CREATININE, SERUM
Creatinine, Ser: 0.38 mg/dL — ABNORMAL LOW (ref 0.44–1.00)
GFR, Estimated: >60 mL/min (ref >60)

## 2024-04-28 MED ORDER — HYDROMORPHONE HCL 2 MG PO TABS
4.0000 mg | ORAL_TABLET | ORAL | Status: DC | PRN
  Administered 2024-04-28 – 2024-05-02 (×11): 4 mg via ORAL
  Filled 2024-04-28 (×11): qty 2

## 2024-04-28 MED ORDER — KETOROLAC TROMETHAMINE 15 MG/ML IJ SOLN
15.0000 mg | Freq: Four times a day (QID) | INTRAMUSCULAR | Status: AC
  Administered 2024-04-28 – 2024-04-29 (×5): 15 mg via INTRAVENOUS
  Filled 2024-04-28 (×5): qty 1

## 2024-04-28 NOTE — Evaluation (Signed)
 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     Clinical Impressions Pt was seen for OT evaluation this date. Prior to hospital admission, pt was mod indep with ADL, light meal prep, and light chores. Has modified routines as needed over the years. Pt lives with her spouse who she reports will be able to provide assist as needed. Pt presents with deficits in low back pain, impaired ROM and balance, affecting safe and optimal ADL completion. Pt currently requires PRN MIN A for LB ADL Tasks (except MAX A for compression stocking mgt). Pt edu in back precautions and how to maintain, AE/DME, home/routines modifications, pet care considerations, and falls prevention strategies. Handout provided post-session.  Pt would benefit from skilled OT services to address noted impairments and functional limitations (see below for any additional details) in order to maximize safety and independence while minimizing future risk of falls, injury, and readmission. Anticipate the need for follow up OT services upon acute hospital DC.      If plan is discharge home, recommend the following:   A little help with walking and/or transfers;A little help with bathing/dressing/bathroom;Assistance with cooking/housework;Assist for transportation;Help with stairs or ramp for entrance     Functional Status Assessment   Patient has had a recent decline in their functional status and demonstrates the ability to make significant improvements in function in a reasonable and predictable amount of time.     Equipment Recommendations   None recommended by OT     Recommendations for Other Services         Precautions/Restrictions   Precautions Precautions: Fall;Back Precaution Booklet Issued: Yes  (comment) Recall of Precautions/Restrictions: Intact Required Braces or Orthoses: Spinal Brace Spinal Brace: Lumbar corset Restrictions Weight Bearing Restrictions Per Provider Order: No     Mobility Bed Mobility               General bed mobility comments: NT, in recliner pre and post session    Transfers Overall transfer level: Needs assistance Equipment used: Rolling walker (2 wheels) Transfers: Sit to/from Stand Sit to Stand: Contact guard assist                  Balance Overall balance assessment: Modified Independent                                         ADL either performed or assessed with clinical judgement   ADL Overall ADL's : Needs assistance/impaired                                       General ADL Comments: Pt currently requires PRN MIN A for LB ADL tasks. She reports spouse is able to provide needed level of assist.      Pertinent Vitals/Pain Pain Assessment Pain Assessment: 0-10 Pain Score: 7  Pain Location: low back Pain Descriptors / Indicators: Aching Pain Intervention(s): Limited activity within patient's tolerance, Monitored during session, Premedicated before session, Repositioned     Extremity/Trunk Assessment Upper Extremity Assessment Upper Extremity Assessment: Overall WFL for tasks assessed   Lower Extremity Assessment Lower Extremity Assessment: Defer to PT evaluation;Overall  WFL for tasks assessed       Communication Communication Communication: No apparent difficulties   Cognition Arousal: Alert Behavior During Therapy: WFL for tasks assessed/performed Cognition: No apparent impairments                               Following commands: Intact       Cueing  General Comments         Exercises Other Exercises Other Exercises: Pt edu in back precautions and how to maintain, AE/DME, home/routines modifications, pet care considerations, and falls prevention  strategies. Handout provided post-session   Shoulder Instructions      Home Living Family/patient expects to be discharged to:: Private residence Living Arrangements: Spouse/significant other Available Help at Discharge: Family;Available PRN/intermittently (husband works 10hr days but will be home initially, then daughter staying for 1 week)   Home Access: Stairs to enter [employer] of Steps: 2 (1+1) Entrance Stairs-Rails:  (holds to a post) Home Layout: One level               Home Equipment: Cane - single point;Shower seat;Hand held shower head;Grab bars - tub/shower;Adaptive equipment Adaptive Equipment: Reacher;Long-handled sponge        Prior Functioning/Environment Prior Level of Function : Needs assist             Mobility Comments: Pt reports she is not out of the house a lot but able to do limited distances with [facility] ADLs Comments: modest in-home chores with modified equipment /set up, mod indep with ADL    OT Problem List: Pain;Decreased range of motion;Impaired balance (sitting and/or standing);Decreased knowledge of use of DME or AE   OT Treatment/Interventions: Self-care/ADL training;Therapeutic exercise;Therapeutic activities;DME and/or AE instruction;Patient/family education;Balance training      OT Goals(Current goals can be found in the care plan section)   Acute Rehab OT Goals Patient Stated Goal: go home OT Goal Formulation: With patient Time For Goal Achievement: 05/12/24 Potential to Achieve Goals: Good ADL Goals Pt Will Perform Lower Body Dressing: with modified independence;with adaptive equipment;sitting/lateral leans;sit to/from stand;with caregiver independent in assisting Pt Will Transfer to Toilet: with modified independence;ambulating (LRAD) Pt Will Perform Toileting - Clothing Manipulation and hygiene: with modified independence;sitting/lateral leans;sit to/from stand Additional ADL Goal #1: Pt will complete all aspects of  bathing with mod indep (PRN caregiver assist as needed), AE PRN, maintaining back precautions, 1/1 opportunity.   OT Frequency:  Min 2X/week    Co-evaluation              AM-PAC OT 6 Clicks Daily Activity     Outcome Measure Help from another person eating meals?: None Help from another person taking care of personal grooming?: None Help from another person toileting, which includes using toliet, bedpan, or urinal?: A Little Help from another person bathing (including washing, rinsing, drying)?: A Little Help from another person to put on and taking off regular upper body clothing?: None Help from another person to put on and taking off regular lower body clothing?: A Little 6 Click Score: 21   End of Session    Activity Tolerance: Patient tolerated treatment well Patient left: in chair;with call bell/phone within reach;with chair alarm set  OT Visit Diagnosis: Other abnormalities of gait and mobility (R26.89)                Time: 8860-8846 OT Time Calculation (min): 14 min Charges:  OT General Charges $OT Visit:  1 Visit OT Evaluation $OT Eval Low Complexity: 1 Low OT Treatments $Self Care/Home Management : 8-22 mins  Warren SAUNDERS., MPH, MS, OTR/L ascom [phone] 04/28/24, 1:48 PM

## 2024-04-28 NOTE — Evaluation (Signed)
 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  Clinical Impression  Pt did well with PT eval and subsequent mobility/gait training.  She had expected POD1 pain but did not have significant increase with mobility/activity.  She was in sidelying on arrival, able to safely transition up to sitting w/o assist using appropriate protective technique.  Educated on and assist with donning LSO, pt with good understanding.  She was able to circumambulate the nurses' station with slow but safe gait using the walker, will likely trial [facility] next visit.      If plan is discharge home, recommend the following: A little help with walking and/or transfers;A little help with bathing/dressing/bathroom;Assistance with cooking/housework;Help with stairs or ramp for entrance   Can travel by private vehicle        Equipment Recommendations Rolling walker (2 wheels)  Recommendations for Other Services       Functional Status Assessment Patient has had a recent decline in their functional status and demonstrates the ability to make significant improvements in function in a reasonable and predictable amount of time.     Precautions / Restrictions Precautions Precautions: Fall;Back Recall of Precautions/Restrictions: Intact Required Braces or Orthoses: Spinal Brace Spinal Brace: Lumbar corset Restrictions Weight Bearing Restrictions Per Provider Order: No      Mobility  Bed Mobility Overal bed mobility: Needs Assistance Bed Mobility: Sidelying to Sit   Sidelying to sit: Used rails, Supervision       General bed mobility comments: minimal cuing for log roll, precaution reminders, able to transition up to sitting EOB with relative ease    Transfers Overall transfer level: Needs  assistance Equipment used: Rolling walker (2 wheels) Transfers: Sit to/from Stand Sit to Stand: Contact guard assist           General transfer comment: cuing for set up and strategies - able to rise with heavy UE use but no direct assist    Ambulation/Gait Ambulation/Gait assistance: Contact guard assist Gait Distance (Feet): 200 Feet Assistive device: Rolling walker (2 wheels)         General Gait Details: Pt showed good effort with prolonged ambulation effort did not have any LOBs or excessive fatigue/pain/etc.  She reports feeling more stable and safe with the walker but does wish to trial baseline SPC next visit  Stairs            Wheelchair Mobility     Tilt Bed    Modified Rankin (Stroke Patients Only)       Balance Overall balance assessment: Modified Independent                                           Pertinent Vitals/Pain Pain Assessment Pain Assessment: 0-10 Pain Score: 6  Pain Location: low back    Home Living Family/patient expects to be discharged to:: Private residence Living Arrangements: Spouse/significant other Available Help at Discharge: Family;Available PRN/intermittently (husband works 10hr days but will be home initially, then daughter staying for 1 week)   Home Access: Stairs to enter Entrance Stairs-Rails:  (holds to a post) Secretary[employer]administrator of Steps: 2 (1+1)   Home Layout: One level Home Equipment: Cane - single point;Shower seat      Prior Function  Prior Level of Function : Needs assist             Mobility Comments: Pt reports she is not out of the house a lot but able to do limited distances with [facility] ADLs Comments: modest in-home chores with modified equipment /set up     Extremity/Trunk Assessment   Upper Extremity Assessment Upper Extremity Assessment: Overall WFL for tasks assessed    Lower Extremity Assessment Lower Extremity Assessment: Overall WFL for tasks assessed        Communication   Communication Communication: No apparent difficulties    Cognition Arousal: Alert Behavior During Therapy: WFL for tasks assessed/performed   PT - Cognitive impairments: No apparent impairments                       PT - Cognition Comments: Pt has history of back issues/surgeries and was aware of precautions, etc Following commands: Intact       Cueing       General Comments General comments (skin integrity, edema, etc.): Pt showed good effort and willingness to work with PT.  Expected POD1 pain but relatively mobile    Exercises     Assessment/Plan    PT Assessment Patient needs continued PT services  PT Problem List Decreased strength;Decreased range of motion;Decreased activity tolerance;Decreased balance;Decreased mobility;Decreased safety awareness;Decreased knowledge of precautions;Decreased knowledge of use of DME;Pain       PT Treatment Interventions DME instruction;Gait training;Functional mobility training;Therapeutic activities;Therapeutic exercise;Balance training;Patient/family education    PT Goals (Current goals can be found in the Care Plan section)  Acute Rehab PT Goals Patient Stated Goal: Go home PT Goal Formulation: With patient Time For Goal Achievement: 05/11/24 Potential to Achieve Goals: Good    Frequency Min 3X/week     Co-evaluation               AM-PAC PT 6 Clicks Mobility  Outcome Measure Help needed turning from your back to your side while in a flat bed without using bedrails?: A Little Help needed moving from lying on your back to sitting on the side of a flat bed without using bedrails?: A Little Help needed moving to and from a bed to a chair (including a wheelchair)?: A Little Help needed standing up from a chair using your arms (e.g., wheelchair or bedside chair)?: A Little Help needed to walk in hospital room?: A Little Help needed climbing 3-5 steps with a railing? : A Little 6 Click Score:  18    End of Session Equipment Utilized During Treatment: Gait belt;Back brace Activity Tolerance: Patient tolerated treatment well;Patient limited by pain Patient left: in chair;with call bell/phone within reach Nurse Communication: Mobility status PT Visit Diagnosis: Muscle weakness (generalized) (M62.81);Difficulty in walking, not elsewhere classified (R26.2);Pain Pain - part of body:  (lumbago)    Time: 1105-1130 PT Time Calculation (min) (ACUTE ONLY): 25 min   Charges:   PT Evaluation $PT Eval Low Complexity: 1 Low PT Treatments $Gait Training: 8-22 mins PT General Charges $$ ACUTE PT VISIT: 1 Visit         Carmin JONELLE Deed, DPT 04/28/2024, 1:32 PM

## 2024-04-28 NOTE — Progress Notes (Signed)
 Neurosurgery Progress Note  History: Breanna Ramos is here for pseudoarthrosis and coronal plane imbalance.  POD1: Difficulty with pain overnight.  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  She had since had some difficulty with pain control.  - mobilize - pain control - will switch to oral and try short course of toradol  - DVT prophylaxis - PTOT - Continue drain  Reeves Daisy MD, [facility] Department of Neurosurgery

## 2024-04-28 NOTE — Anesthesia Postprocedure Evaluation (Signed)
 Anesthesia Post Note  Patient: Logan Vegh  Procedure(s) Performed: OPEN L2-3 & L4-5 TRANSFORAMINAL LUMBAR INTERBODY FUSION (TLIF), L2-5 POSTERIOR SPINAL FUSION APPLICATION OF INTRAOPERATIVE CT SCAN  Patient location during evaluation: PACU Anesthesia Type: General Level of consciousness: awake and alert Pain management: pain level controlled Vital Signs Assessment: post-procedure vital signs reviewed and stable Respiratory status: spontaneous breathing, nonlabored ventilation, respiratory function stable and patient connected to nasal cannula oxygen Cardiovascular status: blood pressure returned to baseline and stable Postop Assessment: no apparent nausea or vomiting Anesthetic complications: no   No notable events documented.   Last Vitals:  Vitals:   04/27/24 2315 04/28/24 0441  BP: (!) 169/87 (!) 141/93  Pulse: 88 100  Resp: 20 20  Temp: 36.7 C 36.4 C  SpO2: 96% 93%    Last Pain:  Vitals:   04/28/24 0513  TempSrc:   PainSc: 8                  Prentice Murphy

## 2024-04-28 NOTE — Plan of Care (Signed)
  Problem: Pain Management: Goal: Pain level will decrease Outcome: Not Progressing   Patient still rating pain level between 8-10 and asking for pain medications at least and hour to 2 hours before next dose and screaming.

## 2024-04-28 NOTE — Progress Notes (Addendum)
 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   OT comments  Pt seen for brief OT tx. Pt/family provided with back precautions handout and strategies for managing ADL/mobility with precautions in place. Reviewed compression stocking mgt, optimal positioning while in bed in sidelying and pillows for comfort/maintaining optimal spinal alignment, use of AE for LB ADL. Pt/family verbalized understanding. Declined additional questions/concerns. Pt agreeable to trial of AE next morning with OT.       If plan is discharge home, recommend the following:  A little help with walking and/or transfers;A little help with bathing/dressing/bathroom;Assistance with cooking/housework;Assist for transportation;Help with stairs or ramp for entrance   Equipment Recommendations  Other (comment) (consider long handled shoe horn, sock aide)    Recommendations for Other Services      Precautions / Restrictions Precautions Precautions: Fall;Back Precaution Booklet Issued: Yes (comment) Recall of Precautions/Restrictions: Intact Required Braces or Orthoses: Spinal Brace Spinal Brace: Lumbar corset Restrictions Weight Bearing Restrictions Per Provider Order: No       Mobility Bed Mobility               General bed mobility comments: deferred/declined, pt sidelying and comfortable    Transfers Overall transfer level: Needs assistance Equipment used: Rolling walker (2 wheels) Transfers: Sit to/from Stand Sit to Stand: Contact guard assist                 Balance Overall balance assessment: Modified Independent                                         ADL either performed or assessed with clinical judgement   ADL Overall ADL's : Needs assistance/impaired                                        General ADL Comments: Pt currently requires PRN MIN A for LB ADL tasks. She reports spouse is able to provide needed level of assist.    Extremity/Trunk Assessment Upper Extremity Assessment Upper Extremity Assessment: Overall WFL for tasks assessed   Lower Extremity Assessment Lower Extremity Assessment: Defer to PT evaluation;Overall [facility] for tasks assessed        Vision       Perception     Praxis     Communication Communication Communication: No apparent difficulties   Cognition Arousal: Alert Behavior During Therapy: WFL for tasks assessed/performed Cognition: No apparent impairments                               Following commands: Intact        Cueing      Exercises Other Exercises Other Exercises: Pt/family educated in handout for back precautions and strategies for managing ADL/mobility with precautions in place. Reviewed compression stocking mgt, optimal positioning while in bed in sidelying and pillows for comfort/maintaining optimal spinal alignment, use of AE for LB ADL.    Shoulder Instructions       General Comments     Pertinent Vitals/ Pain       Pain Assessment Pain Assessment: 0-10 Pain Score: 7  Pain Location: low back Pain Descriptors / Indicators: Aching Pain Intervention(s): Limited activity within patient's tolerance, Monitored during session, Premedicated before session, Repositioned  Home Living Family/patient expects to be discharged to:: Private residence Living Arrangements: Spouse/significant other Available Help at Discharge: Family;Available PRN/intermittently (husband works 10hr days but will be home initially, then daughter staying for 1 week)   Home Access: Stairs to enter [employer] of Steps: 2 (1+1) Entrance Stairs-Rails:  (holds to a post) Home Layout: One level               Home Equipment: Cane - single point;Shower seat;Hand held shower  head;Grab bars - tub/shower;Adaptive equipment Adaptive Equipment: Reacher;Long-handled sponge        Prior Functioning/Environment              Frequency  Min 2X/week        Progress Toward Goals  OT Goals(current goals can now be found in the care plan section)  Progress towards OT goals: Progressing toward goals  Acute Rehab OT Goals Patient Stated Goal: go home OT Goal Formulation: With patient Time For Goal Achievement: 05/12/24 Potential to Achieve Goals: Good ADL Goals Pt Will Perform Lower Body Dressing: with modified independence;with adaptive equipment;sitting/lateral leans;sit to/from stand;with caregiver independent in assisting Pt Will Transfer to Toilet: with modified independence;ambulating (LRAD) Pt Will Perform Toileting - Clothing Manipulation and hygiene: with modified independence;sitting/lateral leans;sit to/from stand Additional ADL Goal #1: Pt will complete all aspects of bathing with mod indep (PRN caregiver assist as needed), AE PRN, maintaining back precautions, 1/1 opportunity.  Plan      Co-evaluation                 AM-PAC OT 6 Clicks Daily Activity     Outcome Measure   Help from another person eating meals?: None Help from another person taking care of personal grooming?: None Help from another person toileting, which includes using toliet, bedpan, or urinal?: A Little Help from another person bathing (including washing, rinsing, drying)?: A Little Help from another person to put on and taking off regular upper body clothing?: None Help from another person to put on and taking off regular lower body clothing?: A Little 6 Click Score: 21    End of Session    OT Visit Diagnosis: Other abnormalities of gait and mobility (R26.89)   Activity Tolerance Patient tolerated treatment well   Patient Left in bed;with call bell/phone within reach;with bed alarm set;with family/visitor present   Nurse Communication           Time: 8590-8581 OT Time Calculation (min): 9 min  Charges: OT General Charges $OT Visit: 1 Visit OT Treatments $Self Care/Home Management : 8-22 mins  Warren SAUNDERS., MPH, MS, OTR/L ascom [phone] 04/28/24, 2:33 PM

## 2024-04-29 ENCOUNTER — Encounter: Payer: Self-pay | Admitting: Neurosurgery

## 2024-04-29 ENCOUNTER — Other Ambulatory Visit: Payer: Self-pay

## 2024-04-29 DIAGNOSIS — M51362 Other intervertebral disc degeneration, lumbar region with discogenic back pain and lower extremity pain: Secondary | ICD-10-CM

## 2024-04-29 LAB — CBC
HCT: 27.9 % — ABNORMAL LOW (ref 36.0–46.0)
Hemoglobin: 9.5 g/dL — ABNORMAL LOW (ref 12.0–15.0)
MCH: 30.9 pg (ref 26.0–34.0)
MCHC: 34.1 g/dL (ref 30.0–36.0)
MCV: 90.9 fL (ref 80.0–100.0)
Platelets: 294 K/uL (ref 150–400)
RBC: 3.07 MIL/uL — ABNORMAL LOW (ref 3.87–5.11)
RDW: 12.7 % (ref 11.5–15.5)
WBC: 10.7 K/uL — ABNORMAL HIGH (ref 4.0–10.5)
nRBC: 0 % (ref 0.0–0.2)

## 2024-04-29 LAB — BASIC METABOLIC PANEL WITH GFR
Anion gap: 9 (ref 5–15)
BUN: 11 mg/dL (ref 6–20)
CO2: 29 mmol/L (ref 22–32)
Calcium: 8.2 mg/dL — ABNORMAL LOW (ref 8.9–10.3)
Chloride: 103 mmol/L (ref 98–111)
Creatinine, Ser: 0.44 mg/dL (ref 0.44–1.00)
GFR, Estimated: >60 mL/min (ref >60)
Glucose, Bld: 110 mg/dL — ABNORMAL HIGH (ref 70–99)
Potassium: 3.2 mmol/L — ABNORMAL LOW (ref 3.5–5.1)
Sodium: 141 mmol/L (ref 135–145)

## 2024-04-29 MED ORDER — POTASSIUM CHLORIDE 20 MEQ PO PACK
80.0000 meq | PACK | Freq: Once | ORAL | Status: AC
  Administered 2024-04-29: 80 meq via ORAL
  Filled 2024-04-29: qty 4

## 2024-04-29 NOTE — Plan of Care (Signed)

## 2024-04-29 NOTE — Progress Notes (Signed)
 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    PT Comments  Patient seen for PT session focused on bed level exercises due to pain. Tolerated session fair with mild levels of increased pain. Main limiting factors today were pain. Interventions aimed at improving activity tolerance. Patient shows good potential to make progress with continued acute level rehab. Continued skilled PT recommended to progress toward functional goals and support discharge readiness. Pt making good progress toward goals, will continue to follow POC. Discharge recommendation remains appropriate     If plan is discharge home, recommend the following: A little help with walking and/or transfers;A little help with bathing/dressing/bathroom;Assistance with cooking/housework;Help with stairs or ramp for entrance   Can travel by private vehicle        Equipment Recommendations  Rolling walker (2 wheels)    Recommendations for Other Services       Precautions / Restrictions Precautions Precautions: Fall;Back Precaution Booklet Issued: Yes (comment) Recall of Precautions/Restrictions: Intact Required Braces or Orthoses: Spinal Brace Spinal Brace: Lumbar corset Restrictions Weight Bearing Restrictions Per Provider Order: No     Mobility  Bed Mobility Overal bed mobility: Needs Assistance Bed Mobility: Sidelying to Sit   Sidelying to sit: Used rails, Supervision       General bed mobility comments: declined futher mobility due to pain; bed level exercises provided in addition to education    Transfers                        Ambulation/Gait                   Stairs             Wheelchair Mobility     Tilt Bed    Modified Rankin (Stroke Patients Only)        Balance                                            Communication    Cognition Arousal: Alert Behavior During Therapy: [facility] for tasks assessed/performed                                    Cueing    Exercises Other Exercises Other Exercises: Pt edu in back precautions and how to maintain, AE/DME, home/routines modifications, pet care considerations, and falls prevention strategies. Handout provided post-session Other Exercises: pelvic tilts  3 x 5; LE cycling 3 x10; gluteal sets 3 x5  in sidelying; educated on frequency and importance of bed level exercises    General Comments        Pertinent Vitals/Pain Pain Assessment Pain Score: 7  Pain Location: low back Pain Descriptors / Indicators: Aching Pain Intervention(s): Limited activity within patient's tolerance, Monitored during session, Premedicated before session, Repositioned    Home Living                          Prior Function            PT Goals (current goals can now be found in the care plan section) Acute Rehab  PT Goals Patient Stated Goal: Go home PT Goal Formulation: With patient Time For Goal Achievement: 05/11/24 Potential to Achieve Goals: Good Progress towards PT goals: Progressing toward goals    Frequency    Min 3X/week      PT Plan      Co-evaluation              AM-PAC PT 6 Clicks Mobility   Outcome Measure  Help needed turning from your back to your side while in a flat bed without using bedrails?: A Little Help needed moving from lying on your back to sitting on the side of a flat bed without using bedrails?: A Little Help needed moving to and from a bed to a chair (including a wheelchair)?: A Little Help needed standing up from a chair using your arms (e.g., wheelchair or bedside chair)?: A Little Help needed to walk in hospital room?: A Little Help needed climbing 3-5 steps with a railing? : A Little 6 Click Score: 18    End of  Session   Activity Tolerance: Patient limited by pain Patient left: in chair;with call bell/phone within reach Nurse Communication: Mobility status PT Visit Diagnosis: Muscle weakness (generalized) (M62.81);Difficulty in walking, not elsewhere classified (R26.2);Pain     Time: 1041-1056 PT Time Calculation (min) (ACUTE ONLY): 15 min  Charges:    $Therapeutic Exercise: 8-22 mins PT General Charges $$ ACUTE PT VISIT: 1 Visit                     Sherlean Lesches DPT, PT    Sherlean A Navil Kole 04/29/2024, 11:12 AM

## 2024-04-29 NOTE — Progress Notes (Signed)
 Neurosurgery Progress Note  History: Kaliya Shreiner is here for pseudoarthrosis and coronal plane imbalance.  POD2: Doing well but having pain POD1: Difficulty with pain overnight.  Physical Exam: Vitals:   04/29/24 0801 04/29/24 1311  BP: (!) 140/69 109/65  Pulse: 91 88  Resp: 17 17  Temp: 98.9 F (37.2 C) 99.5 F (37.5 C)  SpO2: 95% 96%    AA Ox3 CNI  Strength: Moves legs well, able to walk to bathrom Incision c/d/i  Data:  Other tests/results: drain 200  Assessment/Plan:  Maitri Schnoebelen is doing well after extension of fusion for pseudoarthrosis and coronal plane deformity.  She had since had some difficulty with pain control.  - mobilize - pain control - DVT prophylaxis - PTOT - Continue drain  Reeves Daisy MD, [facility] Department of Neurosurgery

## 2024-04-29 NOTE — Progress Notes (Addendum)
 OT Cancellation Note  Patient Details Name: Malisha Mabey MRN: [id number] DOB: 22-Jan-1971   Cancelled Treatment:    Reason Eval/Treat Not Completed: Patient declined, no reason specified;Pain limiting ability to participate. 2 attempts this morning. Pt pain limited despite being premedicated. Pt reports pain meds have not helped and feeling hot. Provided with fresh ice water  and cool wash cloth. Will re-attempt as able.   Third attempt in the afternoon, pt sleeping soundly. Will continue to re-attempt as able.  Alejandrina Raimer R., MPH, MS, OTR/L ascom [phone] 04/29/24, 9:51 AM

## 2024-04-29 NOTE — Plan of Care (Signed)
  Problem: Education: Goal: Knowledge of General Education information will improve Description: Including pain rating scale, medication(s)/side effects and non-pharmacologic comfort measures Outcome: Progressing   Problem: Clinical Measurements: Goal: Respiratory complications will improve Outcome: Progressing   Problem: Activity: Goal: Risk for activity intolerance will decrease Outcome: Progressing   Problem: Coping: Goal: Level of anxiety will decrease Outcome: Progressing   Problem: Elimination: Goal: Will not experience complications related to bowel motility Outcome: Progressing   Problem: Pain Managment: Goal: General experience of comfort will improve and/or be controlled Outcome: Progressing   Problem: Safety: Goal: Ability to remain free from injury will improve Outcome: Progressing

## 2024-04-30 ENCOUNTER — Observation Stay

## 2024-04-30 ENCOUNTER — Other Ambulatory Visit: Payer: Self-pay

## 2024-04-30 LAB — RESPIRATORY PANEL BY PCR

## 2024-04-30 LAB — CBC WITH DIFFERENTIAL/PLATELET
Abs Immature Granulocytes: 0.05 K/uL (ref 0.00–0.07)
Basophils Absolute: 0 K/uL (ref 0.0–0.1)
Basophils Relative: 0 %
Eosinophils Absolute: 0.2 K/uL (ref 0.0–0.5)
Eosinophils Relative: 1 %
HCT: 27.8 % — ABNORMAL LOW (ref 36.0–46.0)
Hemoglobin: 9.2 g/dL — ABNORMAL LOW (ref 12.0–15.0)
Immature Granulocytes: 0 %
Lymphocytes Relative: 16 %
Lymphs Abs: 2 K/uL (ref 0.7–4.0)
MCH: 30.5 pg (ref 26.0–34.0)
MCHC: 33.1 g/dL (ref 30.0–36.0)
MCV: 92.1 fL (ref 80.0–100.0)
Monocytes Absolute: 1.5 K/uL — ABNORMAL HIGH (ref 0.1–1.0)
Monocytes Relative: 12 %
Neutro Abs: 8.7 K/uL — ABNORMAL HIGH (ref 1.7–7.7)
Neutrophils Relative %: 71 %
Platelets: 296 K/uL (ref 150–400)
RBC: 3.02 MIL/uL — ABNORMAL LOW (ref 3.87–5.11)
RDW: 12.5 % (ref 11.5–15.5)
WBC: 12.4 K/uL — ABNORMAL HIGH (ref 4.0–10.5)
nRBC: 0 % (ref 0.0–0.2)

## 2024-04-30 LAB — URINALYSIS, ROUTINE W REFLEX MICROSCOPIC
Bilirubin Urine: NEGATIVE
Bilirubin Urine: NEGATIVE
Glucose, UA: NEGATIVE mg/dL
Glucose, UA: NEGATIVE mg/dL
Hgb urine dipstick: NEGATIVE
Hgb urine dipstick: NEGATIVE
Ketones, ur: 5 mg/dL — AB
Ketones, ur: 5 mg/dL — AB
Leukocytes,Ua: NEGATIVE
Leukocytes,Ua: NEGATIVE
Nitrite: NEGATIVE
Nitrite: NEGATIVE
Protein, ur: 30 mg/dL — AB
Protein, ur: 30 mg/dL — AB
Specific Gravity, Urine: 1.019 (ref 1.005–1.030)
Specific Gravity, Urine: 1.019 (ref 1.005–1.030)
pH: 5 (ref 5.0–8.0)
pH: 5 (ref 5.0–8.0)

## 2024-04-30 NOTE — Plan of Care (Signed)

## 2024-04-30 NOTE — Progress Notes (Signed)
 Neurosurgery Progress Note  History: Amahia Madonia is here for pseudoarthrosis and coronal plane imbalance.  POD3: Febrile overnight.  Reports malodorous urine POD2: Doing well but having pain POD1: Difficulty with pain overnight.  Physical Exam: Vitals:   04/30/24 0248 04/30/24 0746  BP: 131/63 117/73  Pulse: 85 90  Resp: 18 17  Temp: (!) 97.4 F (36.3 C) 98.6 F (37 C)  SpO2: 94% 94%    AA Ox3 CNI  Strength: Moves legs well, able to walk to bathrom Incision c/d/i  Data:  Other tests/results: drain 87  Assessment/Plan:  Mylene Bow is doing well after extension of fusion for pseudoarthrosis and coronal plane deformity.  She had since had some difficulty with pain control.  - mobilize - pain control - DVT prophylaxis - PTOT - Continue drain - fever workup  Reeves Daisy MD, [facility] Department of Neurosurgery

## 2024-04-30 NOTE — Plan of Care (Signed)
  Problem: Activity: Goal: Risk for activity intolerance will decrease Outcome: Progressing   Problem: Nutrition: Goal: Adequate nutrition will be maintained Outcome: Progressing   Problem: Pain Managment: Goal: General experience of comfort will improve and/or be controlled Outcome: Progressing   Problem: Elimination: Goal: Will not experience complications related to urinary retention Outcome: Progressing

## 2024-05-01 NOTE — Plan of Care (Signed)
  Problem: Education: Goal: Knowledge of General Education information will improve Description: Including pain rating scale, medication(s)/side effects and non-pharmacologic comfort measures Outcome: Progressing   Problem: Clinical Measurements: Goal: Ability to maintain clinical measurements within normal limits will improve Outcome: Progressing Goal: Will remain free from infection Outcome: Progressing   Problem: Nutrition: Goal: Adequate nutrition will be maintained Outcome: Progressing   Problem: Coping: Goal: Level of anxiety will decrease Outcome: Progressing   Problem: Elimination: Goal: Will not experience complications related to bowel motility Outcome: Progressing   Problem: Pain Managment: Goal: General experience of comfort will improve and/or be controlled Outcome: Progressing

## 2024-05-01 NOTE — Progress Notes (Signed)
 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    PT Comments  OOB and completes lap on unit with mod I/.supervision.  No LOB or buckling.  Is walking in room on her own without difficulty.  Comfortable with back brace and discharge home when ready.  Supportive husband in room.  Stated she has no stairs at home - only small threshold step.     If plan is discharge home, recommend the following: A little help with bathing/dressing/bathroom;Assistance with cooking/housework;Help with stairs or ramp for entrance;Assist for transportation   Can travel by private vehicle        Equipment Recommendations  Rolling walker (2 wheels)    Recommendations for Other Services       Precautions / Restrictions Precautions Precautions: Fall;Back Precaution Booklet Issued: Yes (comment) Recall of Precautions/Restrictions: Intact Required Braces or Orthoses: Spinal Brace Spinal Brace: Lumbar corset Restrictions Weight Bearing Restrictions Per Provider Order: No     Mobility  Bed Mobility Overal bed mobility: Modified Independent               Patient Response: Cooperative  Transfers Overall transfer level: Independent                      Ambulation/Gait Ambulation/Gait assistance: Modified independent (Device/Increase time) Gait Distance (Feet): 200 Feet Assistive device: Rolling walker (2 wheels) Gait Pattern/deviations: Step-through pattern Gait velocity: dec         Stairs             Wheelchair Mobility     Tilt Bed Tilt Bed Patient Response: Cooperative  Modified Rankin (Stroke Patients Only)       Balance Overall balance assessment: Mild deficits observed, not formally tested                                           Communication Communication Communication: No apparent difficulties  Cognition Arousal: Alert Behavior During Therapy: WFL for tasks assessed/performed   PT - Cognitive impairments: No apparent impairments                         Following commands: Intact      Cueing    Exercises      General Comments        Pertinent Vitals/Pain Pain Assessment Pain Assessment: Faces Faces Pain Scale: Hurts little more Pain Descriptors / Indicators: Sore, Aching Pain Intervention(s): Patient requesting pain meds-RN notified    Home Living                          Prior Function            PT Goals (current goals can now be found in the care plan section) Progress towards PT goals: Progressing toward goals    Frequency    Min 3X/week      PT Plan      Co-evaluation              AM-PAC PT 6 Clicks Mobility   Outcome Measure  Help needed turning from your back to your side while in a flat bed without using bedrails?: None Help needed  moving from lying on your back to sitting on the side of a flat bed without using bedrails?: None Help needed moving to and from a bed to a chair (including a wheelchair)?: None Help needed standing up from a chair using your arms (e.g., wheelchair or bedside chair)?: None Help needed to walk in hospital room?: None Help needed climbing 3-5 steps with a railing? : A Little 6 Click Score: 23    End of Session Equipment Utilized During Treatment: Gait belt;Back brace Activity Tolerance: Patient tolerated treatment well Patient left: in bed;with call bell/phone within reach;with family/visitor present Nurse Communication: Mobility status PT Visit Diagnosis: Muscle weakness (generalized) (M62.81);Difficulty in walking, not elsewhere classified (R26.2);Pain     Time: 8380-8371 PT Time Calculation (min) (ACUTE ONLY): 9 min  Charges:    $Gait Training: 8-22 mins PT General Charges $$ ACUTE PT VISIT: 1  Visit                   Lauraine Gills, PTA 05/01/24, 4:38 PM

## 2024-05-01 NOTE — Plan of Care (Signed)

## 2024-05-01 NOTE — Progress Notes (Signed)
 Neurosurgery Progress Note  History: Breanna Ramos is here for pseudoarthrosis and coronal plane imbalance.  POD4: Still having significant pain, but feeling better.   POD3: Febrile overnight.  Reports malodorous urine POD2: Doing well but having pain POD1: Difficulty with pain overnight.  Physical Exam: Vitals:   05/01/24 0541 05/01/24 0841  BP: 137/75 132/75  Pulse: 79 88  Resp: 16 14  Temp: 99 F (37.2 C) 99.2 F (37.3 C)  SpO2: 94% 95%    AA Ox3 CNI  Strength: Moves legs well, able to walk to bathroom Incision c/d/i  Data:  Other tests/results: drain 58  Lab results reviewed - no infectious etiology noted  Assessment/Plan:  Breanna Ramos is doing well after extension of fusion for pseudoarthrosis and coronal plane deformity.  She had since had some difficulty with pain control.  - mobilize - pain control - DVT prophylaxis - PTOT - Drain removed - fever workup reassuring  Breanna Daisy MD, [facility] Department of Neurosurgery

## 2024-05-01 NOTE — Progress Notes (Signed)
 Pt wants the team to know that she doesn't want any more laxatives or stool softeners. She's worried it'll activate her IBS.

## 2024-05-02 ENCOUNTER — Other Ambulatory Visit: Payer: Self-pay

## 2024-05-02 MED ORDER — CHLORHEXIDINE GLUCONATE 4 % EX SOLN: 1.0000 | CUTANEOUS | 1 refills | Status: AC

## 2024-05-02 MED ORDER — MUPIROCIN 2 % EX OINT: 1.0000 | TOPICAL_OINTMENT | Freq: Two times a day (BID) | CUTANEOUS | 0 refills | Status: AC

## 2024-05-02 MED ORDER — ACETAMINOPHEN 325 MG PO TABS: 650.0000 mg | ORAL_TABLET | ORAL | Status: DC | PRN

## 2024-05-02 MED ORDER — HYDROMORPHONE HCL 4 MG PO TABS
4.0000 mg | ORAL_TABLET | ORAL | 0 refills | Status: AC | PRN
  Filled 2024-05-02: qty 30, 8d supply, fill #0

## 2024-05-02 MED ORDER — SENNA 8.6 MG PO TABS: 1.0000 | ORAL_TABLET | Freq: Two times a day (BID) | ORAL | Status: DC | PRN

## 2024-05-02 MED ORDER — OXYCODONE HCL 15 MG PO TABS
15.0000 mg | ORAL_TABLET | Freq: Four times a day (QID) | ORAL | 0 refills | Status: DC
  Filled 2024-05-02: qty 28, 7d supply, fill #0

## 2024-05-02 MED ORDER — CYCLOBENZAPRINE HCL 10 MG PO TABS
10.0000 mg | ORAL_TABLET | Freq: Three times a day (TID) | ORAL | 0 refills | Status: AC
  Filled 2024-05-02: qty 90, 30d supply, fill #0

## 2024-05-02 NOTE — Discharge Summary (Signed)
 Discharge Summary  Patient ID: Breanna Ramos MRN: [id number] DOB/AGE: 52/05/1970 52 y.o.  Admit date: 04/27/2024 Discharge date: 05/02/2024  Admission Diagnoses: M96.0 Pseudarthrosis after fusion or arthrodesis, M43.8X9 Coronal plane imbalance, M54.16 Lumbar radiculopathy  Discharge Diagnoses:  Principal Problem:   S/P lumbar fusion Active Problems:   Pseudoarthrosis of lumbar spine   Coronal plane imbalance   Lumbar radiculopathy   Degeneration of intervertebral disc of lumbar region with discogenic back pain and lower extremity pain   Discharged Condition: good  Hospital Course:  Breanna Ramos is a 53 y.o presenting with pseudoarthrosis  coronal plane imbalance s/p L2-3 and L4-5 TLIF, L2-5 PSF. Her intraoperative course was uncomplicated.  She was admitted for pain control, drain output monitoring, and therapy evaluation.  She experienced difficulty with pain management which improved with changes to her pain medication regimen.  Her drain output reduced to acceptable level and was removed on postop day 4.  She did experience a mild low-grade fever and underwent a fever workup which was negative.  This resolved.  Her pain was better controlled and she was able to be discharged home on postop day 5.  Her home oxycodone  was increased to 15 mg every 4 hours with breakthrough Dilaudid  4 mg every 3 hours at discharge.  She was given prescriptions for Flexeril  to take as needed TID. She has a history of IBS at baseline and did not experience constipation or abdominal pain during her hospitalization. She was instructed to take OTC Senna as needed should she develop constipation. She was seen by therapy and deemed appropriate for discharge home with plans for OP therapy after her post-op visit and a RW.   Consults: None  Significant Diagnostic Studies:  See results review  Treatments: surgery: As above.  Please see separately dictated operative report for further details.  Discharge Exam: Blood  pressure 114/60, pulse 88, temperature 98.7 F (37.1 C), resp. rate 16, height 5' 5.75 (1.67 m), weight 98.4 kg, last menstrual period 11/14/2022, SpO2 94%.  AA Ox3 CNI   Strength: good and equal strength in BLE.   Disposition: Discharge disposition: 01-Home or Self Care       Discharge Instructions     Incentive spirometry RT   Complete by: As directed    Remove dressing in 48 hours   Complete by: As directed       Allergies as of 05/02/2024       Reactions   Celecoxib Other (See Comments)   NSAIDs are contraindicated d/t ulcerative colitis   Silicone Other (See Comments)   Skin tear with tape removal after hysterectomy on face.   Tape    Skin tear with tape removal after hysterectomy   Codeine Other (See Comments)   Cough syrup with codeine caused hyperactivity        Medication List     STOP taking these medications    oxyCODONE -acetaminophen  10-325 MG tablet Commonly known as: PERCOCET       TAKE these medications    acetaminophen  325 MG tablet Commonly known as: TYLENOL  Take 2 tablets (650 mg total) by mouth every 4 (four) hours as needed for mild pain (pain score 1-3) (or temp > 100.5).   ASCORBIC ACID PO Take 1,000 mg by mouth every evening.   atorvastatin  80 MG tablet Commonly known as: LIPITOR  Take 0.5 tablets (40 mg total) by mouth at bedtime.   bromocriptine  2.5 MG tablet Commonly known as: PARLODEL  Take 1 tablet (2.5 mg total) by mouth at bedtime.  buprenorphine 10 MCG/HR Ptwk Commonly known as: BUTRANS Place 1 patch onto the skin every Sunday.   chlorhexidine  4 % external liquid Commonly known as: HIBICLENS  Apply 15 mLs (1 Application total) topically as directed for 30 doses. Use as directed daily for 5 days every other week for 6 weeks.   cyanocobalamin  1000 MCG tablet Commonly known as: VITAMIN B12 Take 1,000-2,000 mcg by mouth in the morning.   cyclobenzaprine  10 MG tablet Commonly known as: FLEXERIL  Take 1 tablet (10  mg total) by mouth in the morning, at noon, and at bedtime.   diltiazem  240 MG 24 hr capsule Commonly known as: TIAZAC  Take 240 mg by mouth in the morning.   esomeprazole 20 MG capsule Commonly known as: NEXIUM Take 20 mg by mouth in the morning and at bedtime.   estradiol  0.1 MG/GM vaginal cream Commonly known as: ESTRACE  Place 0.5 g vaginally every Sunday.   famciclovir 500 MG tablet Commonly known as: FAMVIR Take 500 mg by mouth every 8 (eight) hours as needed (cold sores/fever blisters).   fexofenadine 180 MG tablet Commonly known as: ALLEGRA Take 180 mg by mouth every evening.   FLUoxetine  20 MG capsule Commonly known as: PROZAC  Take 60 mg by mouth in the morning.   gabapentin  300 MG capsule Commonly known as: NEURONTIN  Take 300 mg by mouth in the morning, at noon, in the evening, and at bedtime.   HYDROmorphone  4 MG tablet Commonly known as: DILAUDID  Take 1 tablet (4 mg total) by mouth every 3 (three) hours as needed for up to 5 days for severe pain (pain score 7-10) (refractory to Oxycodone ).   lamoTRIgine  200 MG tablet Commonly known as: LAMICTAL  Take 200 mg by mouth in the morning.   lamoTRIgine  25 MG tablet Commonly known as: LAMICTAL  Take 50 mg by mouth at bedtime.   losartan  50 MG tablet Commonly known as: COZAAR  Take 1 tablet (50 mg total) by mouth once daily as needed If your top BP number >145   mesalamine  1.2 g EC tablet Commonly known as: LIALDA  Take 4.8 g by mouth in the morning.   metFORMIN  500 MG tablet Commonly known as: GLUCOPHAGE  Take 500-1,000 mg by mouth 2 (two) times daily with a meal. 1000 mg qam and 500 mg qpm   multivitamin with minerals Tabs tablet Take 1 tablet by mouth every evening. Women's 50+   mupirocin  ointment 2 % Commonly known as: BACTROBAN  Place 1 Application into the nose 2 (two) times daily for 60 doses. Use as directed 2 times daily for 5 days every other week for 6 weeks.   ondansetron  8 MG disintegrating  tablet Commonly known as: ZOFRAN -ODT Take 8 mg by mouth every 8 (eight) hours as needed for nausea or vomiting.   ondansetron  8 MG tablet Commonly known as: ZOFRAN  Take 8 mg by mouth every 8 (eight) hours as needed for vomiting or nausea.   oxyCODONE  15 MG immediate release tablet Commonly known as: ROXICODONE  Take 1 tablet (15 mg total) by mouth every 6 (six) hours for 7 days.   propranolol  20 MG tablet Commonly known as: INDERAL  Take 20 mg by mouth 2 (two) times daily. 20 mg + 10 mg=30 mg/twice daily   propranolol  10 MG tablet Commonly known as: INDERAL  Take 10 mg by mouth 2 (two) times daily. 10 mg + 20 mg=30 mg/twice daily   senna 8.6 MG Tabs tablet Commonly known as: SENOKOT Take 1 tablet (8.6 mg total) by mouth 2 (two) times daily as needed  for mild constipation.   traZODone  100 MG tablet Commonly known as: DESYREL  Take 200 mg by mouth at bedtime.   Voltaren 1 % Gel Generic drug: diclofenac Sodium Apply 1 Application topically daily. Knee/elbow pain.               Durable Medical Equipment  (From admission, onward)           Start     Ordered   04/28/24 1418  For home use only DME Walker rolling  Once       Question Answer Comment  Walker: With 5 Inch Wheels   Patient needs a walker to treat with the following condition Coronal plane imbalance      04/28/24 1419             Signed: Edsel Jama Goods 05/02/2024, 8:51 AM

## 2024-05-02 NOTE — Plan of Care (Signed)
  Problem: Education: Goal: Knowledge of General Education information will improve Description: Including pain rating scale, medication(s)/side effects and non-pharmacologic comfort measures Outcome: Progressing   Problem: Clinical Measurements: Goal: Ability to maintain clinical measurements within normal limits will improve Outcome: Progressing Goal: Will remain free from infection Outcome: Progressing Goal: Cardiovascular complication will be avoided Outcome: Progressing   Problem: Activity: Goal: Risk for activity intolerance will decrease Outcome: Progressing   Problem: Elimination: Goal: Will not experience complications related to bowel motility Outcome: Progressing   Problem: Safety: Goal: Ability to remain free from injury will improve Outcome: Progressing

## 2024-05-02 NOTE — Progress Notes (Signed)
 All discharge requirements met.

## 2024-05-02 NOTE — Progress Notes (Signed)
 Neurosurgery Progress Note  History: Breanna Ramos is here for pseudoarthrosis and coronal plane imbalance.  POD5: Pt feeling better this morning  POD4: Still having significant pain, but feeling better.   POD3: Febrile overnight.  Reports malodorous urine POD2: Doing well but having pain POD1: Difficulty with pain overnight.  Physical Exam: Vitals:   05/01/24 1955 05/02/24 0348  BP: 131/75 119/70  Pulse: 88 81  Resp: 14 17  Temp: 99.7 F (37.6 C) 99 F (37.2 C)  SpO2: 95% 94%    AA Ox3 CNI  Strength: good and equal strength in BLE.  Incision with wound vac in place   Data:  Lab results reviewed - no infectious etiology noted  Assessment/Plan:  Breanna Ramos is doing well after extension of fusion for pseudoarthrosis and coronal plane deformity.  She had since had some difficulty with pain control.  - mobilize - pain control - please remove wound vac and replace with clean dressing  - DVT prophylaxis - PTOT - Drain removed 12/7 - fever workup reassuring  Edsel Goods PA-C Department of Neurosurgery

## 2024-05-02 NOTE — Plan of Care (Signed)
  Problem: Education: Goal: Knowledge of General Education information will improve Description: Including pain rating scale, medication(s)/side effects and non-pharmacologic comfort measures Outcome: Adequate for Discharge   Problem: Health Behavior/Discharge Planning: Goal: Ability to manage health-related needs will improve Outcome: Adequate for Discharge   Problem: Clinical Measurements: Goal: Ability to maintain clinical measurements within normal limits will improve Outcome: Adequate for Discharge Goal: Will remain free from infection Outcome: Adequate for Discharge Goal: Diagnostic test results will improve Outcome: Adequate for Discharge Goal: Respiratory complications will improve Outcome: Adequate for Discharge Goal: Cardiovascular complication will be avoided Outcome: Adequate for Discharge   Problem: Activity: Goal: Risk for activity intolerance will decrease Outcome: Adequate for Discharge   Problem: Nutrition: Goal: Adequate nutrition will be maintained Outcome: Adequate for Discharge   Problem: Coping: Goal: Level of anxiety will decrease Outcome: Adequate for Discharge   Problem: Elimination: Goal: Will not experience complications related to bowel motility Outcome: Adequate for Discharge Goal: Will not experience complications related to urinary retention Outcome: Adequate for Discharge   Problem: Pain Managment: Goal: General experience of comfort will improve and/or be controlled Outcome: Adequate for Discharge   Problem: Safety: Goal: Ability to remain free from injury will improve Outcome: Adequate for Discharge   Problem: Skin Integrity: Goal: Risk for impaired skin integrity will decrease Outcome: Adequate for Discharge   Problem: Education: Goal: Ability to verbalize activity precautions or restrictions will improve Outcome: Adequate for Discharge Goal: Knowledge of the prescribed therapeutic regimen will improve Outcome: Adequate for  Discharge Goal: Understanding of discharge needs will improve Outcome: Adequate for Discharge   Problem: Activity: Goal: Ability to avoid complications of mobility impairment will improve Outcome: Adequate for Discharge Goal: Ability to tolerate increased activity will improve Outcome: Adequate for Discharge Goal: Will remain free from falls Outcome: Adequate for Discharge   Problem: Bowel/Gastric: Goal: Gastrointestinal status for postoperative course will improve Outcome: Adequate for Discharge   Problem: Clinical Measurements: Goal: Ability to maintain clinical measurements within normal limits will improve Outcome: Adequate for Discharge Goal: Postoperative complications will be avoided or minimized Outcome: Adequate for Discharge Goal: Diagnostic test results will improve Outcome: Adequate for Discharge   Problem: Pain Management: Goal: Pain level will decrease Outcome: Adequate for Discharge   Problem: Skin Integrity: Goal: Will show signs of wound healing Outcome: Adequate for Discharge   Problem: Health Behavior/Discharge Planning: Goal: Identification of resources available to assist in meeting health care needs will improve Outcome: Adequate for Discharge   Problem: Bladder/Genitourinary: Goal: Urinary functional status for postoperative course will improve Outcome: Adequate for Discharge

## 2024-05-05 LAB — CULTURE, BLOOD (ROUTINE X 2)
Culture: NO GROWTH
Culture: NO GROWTH
Special Requests: ADEQUATE
Special Requests: ADEQUATE

## 2024-05-06 NOTE — Progress Notes (Signed)
° °  REFERRING PHYSICIAN:  Sherial Bail, Md [address] [place],  [place] [address]  DOS: 05/02/24  L2-L3 and L4-L5 TLIF, PSF L2-L5  HISTORY OF PRESENT ILLNESS: Breanna Ramos is approximately 2 weeks status post above surgery.   Her home oxycodone  10mg  was increased to 15mg  every 4 hours with breakthrough dilaudid  4mg  every 3 hours at discharge. She was also given flexeril .   Preop back and right leg pain is about the same. She has new numbness in right anterior and lateral thigh. Pain is better than it was in hospital but still not well controlled.   She is taking oxycodone  14mg  q 4 hours (taking 1 and 1/2 percocet 10). She is out of dilaudid . She has not restarted her butrans patch (was told to stop 3 days prior to surgery).   PHYSICAL EXAMINATION:  General: Patient is well developed, well nourished, calm, collected, and in no apparent distress.   NEUROLOGICAL:  General: In no acute distress.   Awake, alert, oriented to person, place, and time.  Pupils equal round and reactive to light.  Facial tone is symmetric.     Strength:            Side Iliopsoas Quads Hamstring PF DF EHL  R 5 5 5 5 5 5   L 5 5 5 5 5 5    Incision c/d/I, staples removed without complication.   She has diminished sensation in right lateral/anterior thigh (can feel pressure) and right medial calf. Otherwise sensation is intact to light touch.   ROS (Neurologic):  Negative except as noted above  IMAGING: Nothing new to review.   ASSESSMENT/PLAN:  Florette Thai is doing fair s/p above surgery. Treatment options reviewed with patient and following plan made:   - I have advised the patient to lift up to 10 pounds until 6 weeks after surgery (follow up with Dr. Clois).  - Reviewed wound care.  - No bending, twisting, or lifting.  - Continue to wear LSO brace when up and walking.  - Will review with Dr. Clois regarding pain medications- will likely restart butrans, continue oxycodone  15mg  q 4  hours, and have dilaudid  4 mg q 3 for breakthrough pain only. Will message her.  - If she needs refill of dilaudid , will need to make sure [facility] in [place] has it in stock.  - Preop nasal swab was positive for staph- patient is to continue mupirocin  and chloraprep x 6 weeks.  - Follow up as scheduled in 4 weeks and prn.   Advised to contact the office if any questions or concerns arise.  Glade Boys PA-C Department of neurosurgery

## 2024-05-11 ENCOUNTER — Ambulatory Visit: Admitting: Orthopedic Surgery

## 2024-05-11 ENCOUNTER — Encounter: Payer: Self-pay | Admitting: Orthopedic Surgery

## 2024-05-11 VITALS — BP 140/90 | Temp 98.5°F | Ht 65.75 in | Wt 206.0 lb

## 2024-05-11 DIAGNOSIS — M438X9 Other specified deforming dorsopathies, site unspecified: Secondary | ICD-10-CM

## 2024-05-11 DIAGNOSIS — Z981 Arthrodesis status: Secondary | ICD-10-CM

## 2024-05-11 DIAGNOSIS — M960 Pseudarthrosis after fusion or arthrodesis: Secondary | ICD-10-CM

## 2024-05-12 DIAGNOSIS — Z981 Arthrodesis status: Secondary | ICD-10-CM

## 2024-05-12 DIAGNOSIS — M960 Pseudarthrosis after fusion or arthrodesis: Secondary | ICD-10-CM

## 2024-05-13 ENCOUNTER — Other Ambulatory Visit: Payer: Self-pay

## 2024-05-13 MED ORDER — OXYCODONE HCL 15 MG PO TABS
15.0000 mg | ORAL_TABLET | ORAL | 0 refills | Status: DC | PRN
  Filled 2024-05-13: qty 30, 8d supply, fill #0

## 2024-05-13 NOTE — Addendum Note (Signed)
 Addended by: HILMA HASTINGS on: 05/13/2024 12:53 PM   Modules accepted: Orders

## 2024-05-13 NOTE — Telephone Encounter (Signed)
 I called Breanna Ramos yesterday and again today to speak with Dr. Joshua or Breanna Pass PA. I am waiting to hear back.   Spoke with patient- she will stop the percocet 10 (from pain  Ramos). She started butrans patch last night.   Will send refill of oxycodone  15 mg to take q 4 hours prn to [facility] pharmacy.   PMP reviewed and is appropriate.   Will get back to her once I can speak to pain Ramos.

## 2024-05-16 ENCOUNTER — Encounter: Payer: Self-pay | Admitting: Orthopedic Surgery

## 2024-05-16 NOTE — Progress Notes (Addendum)
 I have called [facility] on 05/12/24, 05/13/21, and today 05/16/24 asking to speak with Dr. Joshua or Vernell Pass PA. I have been told each day that they would get back to me and have not heard anything. I will try to call again tomorrow.   ADDENDUM 05/17/24:  I called [facility] again today. I was told that Vernell Pass was out of the office but Dr. Joshua would return my call today. I have not heard anything back. Will try to call again on Monday.   ADDENDUM 05/23/24:  I called [facility] again today. Was told that I needed to speak with Vernell Pass PA as Dr. Joshua had only seen her for one visit. Priority message sent to Vernell Pass to return my call. Patient sent message to update her and see how she was progressing.   ADDENDUM 05/27/24:  Called [facility]. Was assured that message was sent to [facility] last week. Staff was to get in touch with her MA and call me back. I have not received a call back.

## 2024-05-23 DIAGNOSIS — Z981 Arthrodesis status: Secondary | ICD-10-CM

## 2024-05-23 DIAGNOSIS — M960 Pseudarthrosis after fusion or arthrodesis: Secondary | ICD-10-CM

## 2024-05-24 ENCOUNTER — Encounter: Payer: Self-pay | Admitting: Oncology

## 2024-05-24 ENCOUNTER — Other Ambulatory Visit: Payer: Self-pay

## 2024-05-24 MED ORDER — OXYCODONE HCL 15 MG PO TABS
15.0000 mg | ORAL_TABLET | ORAL | 0 refills | Status: DC | PRN
  Filled 2024-05-24: qty 30, 5d supply, fill #0

## 2024-05-24 NOTE — Telephone Encounter (Signed)
 She will stop percocet 10 and restart oxycodone  15mg . She will continue on butrans.   PMP reviewed and is appropriate.

## 2024-05-24 NOTE — Addendum Note (Signed)
 Addended by: HILMA HASTINGS on: 05/24/2024 08:28 AM   Modules accepted: Orders

## 2024-05-30 ENCOUNTER — Other Ambulatory Visit: Payer: Self-pay

## 2024-05-30 ENCOUNTER — Encounter: Payer: Self-pay | Admitting: Orthopedic Surgery

## 2024-05-30 DIAGNOSIS — M960 Pseudarthrosis after fusion or arthrodesis: Secondary | ICD-10-CM

## 2024-05-30 NOTE — Telephone Encounter (Signed)
 I discussed patient with Vernell Pass PA at [facility].   She has appointment scheduled with patient on 06/08/24 and will plan to take over her pain medications at that time.   Will let patient know.

## 2024-06-07 ENCOUNTER — Encounter: Payer: Self-pay | Admitting: Neurosurgery

## 2024-06-07 ENCOUNTER — Ambulatory Visit: Admitting: Neurosurgery

## 2024-06-07 ENCOUNTER — Ambulatory Visit

## 2024-06-07 VITALS — BP 130/82 | Temp 99.2°F | Ht 65.75 in | Wt 206.0 lb

## 2024-06-07 DIAGNOSIS — Z981 Arthrodesis status: Secondary | ICD-10-CM

## 2024-06-07 DIAGNOSIS — M960 Pseudarthrosis after fusion or arthrodesis: Secondary | ICD-10-CM

## 2024-06-07 DIAGNOSIS — M5416 Radiculopathy, lumbar region: Secondary | ICD-10-CM

## 2024-06-07 NOTE — Progress Notes (Signed)
" ° °  REFERRING PHYSICIAN:  Sherial Bail, Md [address] [place],  [place] [address]  DOS: 05/02/24  L2-L3 and L4-L5 TLIF, PSF L2-L5  HISTORY OF PRESENT ILLNESS: Tiaira Arambula is status post above surgery.   She continues to have R leg numbness and some R leg weakness.   She has returned to her baseline medications.  PHYSICAL EXAMINATION:  General: Patient is well developed, well nourished, calm, collected, and in no apparent distress.   NEUROLOGICAL:  General: In no acute distress.   Awake, alert, oriented to person, place, and time.  Pupils equal round and reactive to light.  Facial tone is symmetric.     Strength:            Side Iliopsoas Quads Hamstring PF DF EHL  R 4+ 4+ 5 5 5 5   L 5 5 5 5 5 5    Incision c/d/I  She has diminished sensation in right lateral/anterior thigh (can feel pressure) and right medial calf. Otherwise sensation is intact to light touch.   ROS (Neurologic):  Negative except as noted above  IMAGING: No complications noted  ASSESSMENT/PLAN:  Alazay Leicht is doing fair s/p above surgery.   Will start physical therapy to work on her right leg strength.  If she is still struggling at her next appointment, we will consider nerve conduction study and repeating her imaging with an MRI scan of the lumbar spine.  She may be a candidate for spinal cord stimulator in the future.   Reeves Daisy MD Department of neurosurgery "

## 2024-07-19 ENCOUNTER — Other Ambulatory Visit

## 2024-07-19 ENCOUNTER — Encounter: Admitting: Orthopedic Surgery

## 2024-08-09 ENCOUNTER — Ambulatory Visit: Admitting: Urology

## 2024-08-16 ENCOUNTER — Ambulatory Visit: Admitting: Urology

## 2024-08-17 ENCOUNTER — Ambulatory Visit: Admitting: Urology
